# Patient Record
Sex: Male | Born: 1985 | Race: Black or African American | Hispanic: No | Marital: Single | Smoking: Never smoker
Health system: Southern US, Community
[De-identification: ages and names within clinical notes are randomized; demographics above are authoritative.]

## PROBLEM LIST (undated history)

## (undated) DIAGNOSIS — E871 Hypo-osmolality and hyponatremia: Secondary | ICD-10-CM

## (undated) DIAGNOSIS — E1110 Type 2 diabetes mellitus with ketoacidosis without coma: Secondary | ICD-10-CM

## (undated) DIAGNOSIS — I214 Non-ST elevation (NSTEMI) myocardial infarction: Secondary | ICD-10-CM

## (undated) DIAGNOSIS — R809 Proteinuria, unspecified: Secondary | ICD-10-CM

## (undated) DIAGNOSIS — R1909 Other intra-abdominal and pelvic swelling, mass and lump: Secondary | ICD-10-CM

## (undated) DIAGNOSIS — E559 Vitamin D deficiency, unspecified: Secondary | ICD-10-CM

## (undated) DIAGNOSIS — L02214 Cutaneous abscess of groin: Secondary | ICD-10-CM

## (undated) DIAGNOSIS — I10 Essential (primary) hypertension: Secondary | ICD-10-CM

## (undated) DIAGNOSIS — E1065 Type 1 diabetes mellitus with hyperglycemia: Secondary | ICD-10-CM

## (undated) DIAGNOSIS — Z89611 Acquired absence of right leg above knee: Secondary | ICD-10-CM

## (undated) DIAGNOSIS — D509 Iron deficiency anemia, unspecified: Secondary | ICD-10-CM

## (undated) DIAGNOSIS — R17 Unspecified jaundice: Secondary | ICD-10-CM

## (undated) DIAGNOSIS — D649 Anemia, unspecified: Secondary | ICD-10-CM

## (undated) DIAGNOSIS — E860 Dehydration: Secondary | ICD-10-CM

## (undated) DIAGNOSIS — K529 Noninfective gastroenteritis and colitis, unspecified: Secondary | ICD-10-CM

## (undated) DIAGNOSIS — N179 Acute kidney failure, unspecified: Secondary | ICD-10-CM

## (undated) DIAGNOSIS — E119 Type 2 diabetes mellitus without complications: Secondary | ICD-10-CM

## (undated) DIAGNOSIS — E7800 Pure hypercholesterolemia, unspecified: Secondary | ICD-10-CM

## (undated) HISTORY — DX: Pure hypercholesterolemia, unspecified: E78.00

## (undated) HISTORY — DX: Unspecified jaundice: R17

## (undated) HISTORY — DX: Non-ST elevation (NSTEMI) myocardial infarction: I21.4

## (undated) HISTORY — DX: Cutaneous abscess of groin: L02.214

## (undated) HISTORY — DX: Proteinuria, unspecified: R80.9

## (undated) HISTORY — DX: Other disorders of phosphorus metabolism: E83.39

## (undated) HISTORY — DX: Noninfective gastroenteritis and colitis, unspecified: K52.9

## (undated) HISTORY — DX: Other intra-abdominal and pelvic swelling, mass and lump: R19.09

## (undated) HISTORY — DX: Type 1 diabetes mellitus with hyperglycemia: E10.65

## (undated) HISTORY — DX: Type 2 diabetes mellitus with ketoacidosis without coma: E11.10

## (undated) HISTORY — DX: Acquired absence of right leg above knee: Z89.611

## (undated) HISTORY — DX: Anemia, unspecified: D64.9

## (undated) HISTORY — DX: Essential (primary) hypertension: I10

## (undated) HISTORY — DX: Iron deficiency anemia, unspecified: D50.9

## (undated) HISTORY — DX: Vitamin D deficiency, unspecified: E55.9

## (undated) HISTORY — DX: Hypo-osmolality and hyponatremia: E87.1

## (undated) HISTORY — PX: LEG AMPUTATION ABOVE KNEE: SHX117

## (undated) HISTORY — DX: Dehydration: E86.0

## (undated) HISTORY — DX: Acute kidney failure, unspecified: N17.9

---

## 2012-03-03 DIAGNOSIS — E1065 Type 1 diabetes mellitus with hyperglycemia: Secondary | ICD-10-CM | POA: Insufficient documentation

## 2012-03-03 HISTORY — DX: Type 1 diabetes mellitus with hyperglycemia: E10.65

## 2013-02-19 DIAGNOSIS — N179 Acute kidney failure, unspecified: Secondary | ICD-10-CM | POA: Insufficient documentation

## 2013-02-19 DIAGNOSIS — I214 Non-ST elevation (NSTEMI) myocardial infarction: Secondary | ICD-10-CM | POA: Insufficient documentation

## 2013-02-19 HISTORY — DX: Non-ST elevation (NSTEMI) myocardial infarction: I21.4

## 2013-02-19 HISTORY — DX: Acute kidney failure, unspecified: N17.9

## 2013-02-25 DIAGNOSIS — E7800 Pure hypercholesterolemia, unspecified: Secondary | ICD-10-CM

## 2013-02-25 HISTORY — DX: Pure hypercholesterolemia, unspecified: E78.00

## 2014-11-17 DIAGNOSIS — D509 Iron deficiency anemia, unspecified: Secondary | ICD-10-CM | POA: Insufficient documentation

## 2014-11-17 HISTORY — DX: Iron deficiency anemia, unspecified: D50.9

## 2015-02-07 DIAGNOSIS — I10 Essential (primary) hypertension: Secondary | ICD-10-CM

## 2015-02-07 HISTORY — DX: Essential (primary) hypertension: I10

## 2015-02-15 DIAGNOSIS — Z89611 Acquired absence of right leg above knee: Secondary | ICD-10-CM | POA: Insufficient documentation

## 2015-02-15 HISTORY — DX: Acquired absence of right leg above knee: Z89.611

## 2015-12-14 DIAGNOSIS — R809 Proteinuria, unspecified: Secondary | ICD-10-CM

## 2015-12-14 DIAGNOSIS — D649 Anemia, unspecified: Secondary | ICD-10-CM | POA: Insufficient documentation

## 2015-12-14 HISTORY — DX: Proteinuria, unspecified: R80.9

## 2015-12-14 HISTORY — DX: Anemia, unspecified: D64.9

## 2015-12-14 HISTORY — DX: Other disorders of phosphorus metabolism: E83.39

## 2015-12-16 DIAGNOSIS — E559 Vitamin D deficiency, unspecified: Secondary | ICD-10-CM

## 2015-12-16 HISTORY — DX: Vitamin D deficiency, unspecified: E55.9

## 2016-06-01 ENCOUNTER — Encounter: Payer: Self-pay | Admitting: Emergency Medicine

## 2016-06-01 ENCOUNTER — Inpatient Hospital Stay
Admission: EM | Admit: 2016-06-01 | Discharge: 2016-06-02 | DRG: 638 | Disposition: A | Discharge status: Home / Self Care | Payer: Self-pay | Attending: Internal Medicine | Admitting: Internal Medicine

## 2016-06-01 DIAGNOSIS — Z794 Long term (current) use of insulin: Secondary | ICD-10-CM

## 2016-06-01 DIAGNOSIS — E1110 Type 2 diabetes mellitus with ketoacidosis without coma: Secondary | ICD-10-CM

## 2016-06-01 DIAGNOSIS — E1010 Type 1 diabetes mellitus with ketoacidosis without coma: Principal | ICD-10-CM | POA: Diagnosis present

## 2016-06-01 DIAGNOSIS — N179 Acute kidney failure, unspecified: Secondary | ICD-10-CM | POA: Diagnosis present

## 2016-06-01 DIAGNOSIS — R609 Edema, unspecified: Secondary | ICD-10-CM

## 2016-06-01 DIAGNOSIS — E875 Hyperkalemia: Secondary | ICD-10-CM | POA: Diagnosis present

## 2016-06-01 DIAGNOSIS — E860 Dehydration: Secondary | ICD-10-CM | POA: Diagnosis present

## 2016-06-01 DIAGNOSIS — Z9119 Patient's noncompliance with other medical treatment and regimen: Secondary | ICD-10-CM

## 2016-06-01 HISTORY — DX: Type 2 diabetes mellitus without complications: E11.9

## 2016-06-01 HISTORY — DX: Type 2 diabetes mellitus with ketoacidosis without coma: E11.10

## 2016-06-01 LAB — BASIC METABOLIC PANEL
ANION GAP: 22 — AB (ref 5–15)
BUN: 22 mg/dL — ABNORMAL HIGH (ref 6–20)
CALCIUM: 8.6 mg/dL — AB (ref 8.9–10.3)
CO2: 8 mmol/L — AB (ref 22–32)
Chloride: 106 mmol/L (ref 101–111)
Creatinine, Ser: 1.5 mg/dL — ABNORMAL HIGH (ref 0.61–1.24)
GFR calc non Af Amer: >60 mL/min (ref >60)
Glucose, Bld: 436 mg/dL — ABNORMAL HIGH (ref 65–99)
Potassium: 5 mmol/L (ref 3.5–5.1)
Sodium: 136 mmol/L (ref 135–145)

## 2016-06-01 LAB — URINALYSIS COMPLETE WITH MICROSCOPIC (ARMC ONLY)
Bacteria, UA: NONE SEEN
Bilirubin Urine: NEGATIVE
Leukocytes, UA: NEGATIVE
NITRITE: NEGATIVE
Protein, ur: >500 mg/dL — AB
SPECIFIC GRAVITY, URINE: 1.02 (ref 1.005–1.030)
Squamous Epithelial / LPF: NONE SEEN
pH: 5 (ref 5.0–8.0)

## 2016-06-01 LAB — CBC
HCT: 41.2 % (ref 40.0–52.0)
Hemoglobin: 13.1 g/dL (ref 13.0–18.0)
MCH: 26.4 pg (ref 26.0–34.0)
MCHC: 31.9 g/dL — AB (ref 32.0–36.0)
MCV: 82.7 fL (ref 80.0–100.0)
Platelets: 294 10*3/uL (ref 150–440)
RBC: 4.98 MIL/uL (ref 4.40–5.90)
RDW: 14.9 % — AB (ref 11.5–14.5)
WBC: 6.9 10*3/uL (ref 3.8–10.6)

## 2016-06-01 LAB — COMPREHENSIVE METABOLIC PANEL
ALBUMIN: 2.7 g/dL — AB (ref 3.5–5.0)
ALK PHOS: 108 U/L (ref 38–126)
ALT: 29 U/L (ref 17–63)
AST: 25 U/L (ref 15–41)
Anion gap: 24 — ABNORMAL HIGH (ref 5–15)
BILIRUBIN TOTAL: 1.8 mg/dL — AB (ref 0.3–1.2)
BUN: 22 mg/dL — AB (ref 6–20)
CALCIUM: 9.4 mg/dL (ref 8.9–10.3)
CO2: 9 mmol/L — ABNORMAL LOW (ref 22–32)
CREATININE: 1.59 mg/dL — AB (ref 0.61–1.24)
Chloride: 101 mmol/L (ref 101–111)
GFR calc Af Amer: >60 mL/min (ref >60)
GFR calc non Af Amer: 56 mL/min — ABNORMAL LOW (ref >60)
GLUCOSE: 518 mg/dL — AB (ref 65–99)
Potassium: 5.2 mmol/L — ABNORMAL HIGH (ref 3.5–5.1)
Sodium: 134 mmol/L — ABNORMAL LOW (ref 135–145)
TOTAL PROTEIN: 7 g/dL (ref 6.5–8.1)

## 2016-06-01 LAB — GLUCOSE, CAPILLARY
GLUCOSE-CAPILLARY: 344 mg/dL — AB (ref 65–99)
Glucose-Capillary: 168 mg/dL — ABNORMAL HIGH (ref 65–99)
Glucose-Capillary: 193 mg/dL — ABNORMAL HIGH (ref 65–99)
Glucose-Capillary: 337 mg/dL — ABNORMAL HIGH (ref 65–99)
Glucose-Capillary: 369 mg/dL — ABNORMAL HIGH (ref 65–99)
Glucose-Capillary: 408 mg/dL — ABNORMAL HIGH (ref 65–99)
Glucose-Capillary: 436 mg/dL — ABNORMAL HIGH (ref 65–99)

## 2016-06-01 LAB — BLOOD GAS, VENOUS
Acid-base deficit: 19.6 mmol/L — ABNORMAL HIGH (ref 0.0–2.0)
Bicarbonate: 8.1 mmol/L — ABNORMAL LOW (ref 20.0–28.0)
O2 Saturation: 54.5 %
Patient temperature: 37
pCO2, Ven: 25 mmHg — ABNORMAL LOW (ref 44.0–60.0)
pH, Ven: 7.12 — CL (ref 7.250–7.430)
pO2, Ven: 40 mmHg (ref 32.0–45.0)

## 2016-06-01 LAB — LIPASE, BLOOD: Lipase: 18 U/L (ref 11–51)

## 2016-06-01 MED ORDER — SODIUM CHLORIDE 0.9 % IV SOLN
INTRAVENOUS | Status: DC
  Administered 2016-06-01: 150 mL/h via INTRAVENOUS

## 2016-06-01 MED ORDER — KETOROLAC TROMETHAMINE 15 MG/ML IJ SOLN
15.0000 mg | Freq: Three times a day (TID) | INTRAMUSCULAR | Status: DC | PRN
  Filled 2016-06-01: qty 1

## 2016-06-01 MED ORDER — ONDANSETRON HCL 4 MG/2ML IJ SOLN
4.0000 mg | Freq: Four times a day (QID) | INTRAMUSCULAR | Status: DC | PRN
  Administered 2016-06-01: 4 mg via INTRAVENOUS
  Filled 2016-06-01: qty 2

## 2016-06-01 MED ORDER — SODIUM CHLORIDE 0.9 % IV SOLN
INTRAVENOUS | Status: DC
  Administered 2016-06-01: 3.5 [IU]/h via INTRAVENOUS
  Filled 2016-06-01: qty 2.5

## 2016-06-01 MED ORDER — KCL IN DEXTROSE-NACL 20-5-0.45 MEQ/L-%-% IV SOLN
INTRAVENOUS | Status: DC
  Administered 2016-06-01: 125 mL/h via INTRAVENOUS
  Filled 2016-06-01 (×3): qty 1000

## 2016-06-01 MED ORDER — FAMOTIDINE IN NACL 20-0.9 MG/50ML-% IV SOLN
20.0000 mg | Freq: Two times a day (BID) | INTRAVENOUS | Status: DC
  Administered 2016-06-01 – 2016-06-02 (×2): 20 mg via INTRAVENOUS
  Filled 2016-06-01 (×2): qty 50

## 2016-06-01 MED ORDER — ONDANSETRON HCL 4 MG PO TABS: 4.0000 mg | ORAL_TABLET | Freq: Four times a day (QID) | ORAL | Status: DC | PRN

## 2016-06-01 MED ORDER — SODIUM CHLORIDE 0.9 % IV BOLUS (SEPSIS)
1000.0000 mL | Freq: Once | INTRAVENOUS | Status: AC
  Administered 2016-06-01: 1000 mL via INTRAVENOUS

## 2016-06-01 MED ORDER — ENOXAPARIN SODIUM 40 MG/0.4ML ~~LOC~~ SOLN
40.0000 mg | SUBCUTANEOUS | Status: DC
  Administered 2016-06-01: 40 mg via SUBCUTANEOUS
  Filled 2016-06-01: qty 0.4

## 2016-06-01 MED ORDER — ONDANSETRON HCL 4 MG/2ML IJ SOLN
4.0000 mg | Freq: Once | INTRAMUSCULAR | Status: AC
  Administered 2016-06-01: 4 mg via INTRAVENOUS
  Filled 2016-06-01: qty 2

## 2016-06-01 NOTE — ED Triage Notes (Signed)
States he developed body aches with possible fever /chills 2 days ago  Vomiting started last pm and again this am  Last time vomited was PTA

## 2016-06-01 NOTE — ED Provider Notes (Signed)
[facility] Emergency Department Provider Note  Time seen: 4:05 PM  I have reviewed the triage vital signs and the nursing notes.   HISTORY  Chief Complaint Generalized Body Aches and Emesis    HPI [name] is a 30 y.o. male with a past medical history of diabetes who presents the emergency department nausea, vomiting, generalized weakness. According to the patient for the past 3 or 4 days he has had moderate congestion with mild cough. He woke this morning feeling very nauseated and vomiting, unable to keep down food or fluids. States he feels very weak so he came to the emergency department. Denies fever.   Past Medical History:  Diagnosis Date  . Diabetes mellitus without complication (HCC)     There are no active problems to display for this patient.   History reviewed. No pertinent surgical history.  Prior to Admission medications   Medication Sig Start Date End Date Taking? Authorizing Provider  insulin lispro (HUMALOG) 100 UNIT/ML injection Inject 20 Units into the skin 2 (two) times daily at 10 AM and 5 PM.   Yes Historical Provider, MD    No Known Allergies  No family history on file.  Social History Social History  Substance Use Topics  . Smoking status: Never Smoker  . Smokeless tobacco: Never Used  . Alcohol use No    Review of Systems Constitutional: Negative for fever. Cardiovascular: Negative for chest pain. Respiratory: Negative for shortness of breath. Gastrointestinal: Negative for abdominal pain. Positive for nausea or vomiting. Genitourinary: Negative for dysuria. Neurological: Negative for headache 10-point ROS otherwise negative.  ____________________________________________   PHYSICAL EXAM:  VITAL SIGNS: ED Triage Vitals  Enc Vitals Group     BP 06/01/16 1459 136/71     Pulse Rate 06/01/16 1459 (!) 113     Resp 06/01/16 1459 20     Temp 06/01/16 1459 98 F (36.7 C)     Temp Source 06/01/16 1459 Oral      SpO2 06/01/16 1459 100 %     Weight 06/01/16 1457 189 lb (85.7 kg)     Height 06/01/16 1457 5\' 6"  (1.676 m)     Head Circumference --      Peak Flow --      Pain Score --      Pain Loc --      Pain Edu? --      Excl. in GC? --    Constitutional: Alert and oriented. Well appearing and in no distress.Nauseated. Eyes: Normal exam ENT   Head: Normocephalic and atraumatic.   Mouth/Throat: Very dry mucous membranes. Cardiovascular: Normal rate, regular rhythm. No murmur Respiratory: Normal respiratory effort without tachypnea nor retractions. Breath sounds are clear  Gastrointestinal: Soft and nontender. No distention.   Musculoskeletal: Nontender with normal range of motion in all extremities.  Neurologic:  Normal speech and language. No gross focal neurologic deficits Skin:  Skin is warm, dry and intact.  Psychiatric: Mood and affect are normal. Speech and behavior are normal.   ____________________________________________   INITIAL IMPRESSION / ASSESSMENT AND PLAN / ED COURSE  Pertinent labs & imaging results that were available during my care of the patient were reviewed by me and considered in my medical decision making (see chart for details).  Patient presents the emergency department nausea and vomiting. Patient's blood glucose is significantly elevated with an anion gap of 24 patient states a history of DKA in the past similar to today's presentation. We will start IV fluids,  obtain a VBG to assess pH. Patient will likely require insulin drip and admission to the hospital for further treatment.   CRITICAL CARE Performed by: Minna AntisPADUCHOWSKI, Rosamond Andress   Total critical care time: 30 minutes  Critical care time was exclusive of separately billable procedures and treating other patients.  Critical care was necessary to treat or prevent imminent or life-threatening deterioration.  Critical care was time spent personally by me on the following activities: development of  treatment plan with patient and/or surrogate as well as nursing, discussions with consultants, evaluation of patient's response to treatment, examination of patient, obtaining history from patient or surrogate, ordering and performing treatments and interventions, ordering and review of laboratory studies, ordering and review of radiographic studies, pulse oximetry and re-evaluation of patient's condition.   EKG reviewed and interpreted by myself shows sinus tachycardia 115 bpm, narrow QRS, normal axis, largely normal intervals with nonspecific but no concerning ST changes.  PH of 7.12, K of 5.2. Blood glucose elevated in the 500s we'll start the patient on insulin drip per DKA and admit for further treatment.  ____________________________________________   FINAL CLINICAL IMPRESSION(S) / ED DIAGNOSES  DKA    Minna AntisKevin Isaack Preble, MD 06/01/16 1645

## 2016-06-01 NOTE — H&P (Signed)
[facility] - [place] at [facility]   PATIENT NAME: [name]    MR#:  [id number]  DATE OF BIRTH:  07/23/1985  DATE OF ADMISSION:  06/01/2016  PRIMARY CARE PHYSICIAN: No PCP Per Patient   REQUESTING/REFERRING PHYSICIAN: Minna AntisKevin Paduchowski, MD  CHIEF COMPLAINT:  Vomiting and body aches for 2 days  HISTORY OF PRESENT ILLNESS:  [name]  is a 30 y.o. male with a known history of Diabetic mellitus on NovoLog and Humalog could not recall long-acting insulin name is presenting to the ED with a chief complaint of 2 day history of nausea and vomiting and feeling weak and tired with body aches today. Denies any fever or sick contacts. Could not recall his long-acting insulin name, states he ran out of it several days ago  PAST MEDICAL HISTORY:   Past Medical History:  Diagnosis Date  . Diabetes mellitus without complication (HCC)     PAST SURGICAL HISTOIRY:  History reviewed. No pertinent surgical history.  SOCIAL HISTORY:   Social History  Substance Use Topics  . Smoking status: Never Smoker  . Smokeless tobacco: Never Used  . Alcohol use No    FAMILY HISTORY:  Diabetes mellitus runs in his family  DRUG ALLERGIES:  No Known Allergies  REVIEW OF SYSTEMS:  CONSTITUTIONAL: No fever,Reporting fatigue and weakness.  EYES: No blurred or double vision.  EARS, NOSE, AND THROAT: No tinnitus or ear pain.  RESPIRATORY: No cough, shortness of breath, wheezing or hemoptysis.  CARDIOVASCULAR: No chest pain, orthopnea, edema.  GASTROINTESTINAL: Reporting  nausea, vomiting for 2 days, no diarrhea or abdominal pain.  GENITOURINARY: No dysuria, hematuria.  ENDOCRINE: No polyuria, nocturia,  HEMATOLOGY: No anemia, easy bruising or bleeding SKIN: No rash or lesion. MUSCULOSKELETAL: Reporting generalized body pains No joint pain or arthritis.   NEUROLOGIC: No tingling, numbness, weakness.  PSYCHIATRY: No anxiety or depression.   MEDICATIONS AT HOME:    Prior to Admission medications   Medication Sig Start Date End Date Taking? Authorizing Provider  insulin aspart (NOVOLOG) 100 UNIT/ML injection Inject 20 Units into the skin 3 (three) times daily before meals.   Yes Historical Provider, MD  insulin lispro (HUMALOG) 100 UNIT/ML injection Inject 20 Units into the skin once.    Yes Historical Provider, MD      VITAL SIGNS:  Blood pressure (!) 143/65, pulse (!) 114, temperature 98 F (36.7 C), temperature source Oral, resp. rate 17, height 5\' 6"  (1.676 m), weight 85.7 kg (189 lb), SpO2 100 %.  PHYSICAL EXAMINATION:  Dry mucous membranes GENERAL:  30 y.o.-year-old patient lying in the bed with no acute distress.  EYES: Pupils equal, round, reactive to light and accommodation. No scleral icterus. Extraocular muscles intact.  HEENT: Head atraumatic, normocephalic. Oropharynx and nasopharynx clear.  NECK:  Supple, no jugular venous distention. No thyroid enlargement, no tenderness.  LUNGS: Normal breath sounds bilaterally, no wheezing, rales,rhonchi or crepitation. No use of accessory muscles of respiration.  CARDIOVASCULAR: S1, S2 normal. No murmurs, rubs, or gallops.  ABDOMEN: Soft, nontender, nondistended. Bowel sounds present. No organomegaly or mass.  EXTREMITIES: Right below-knee amputation No pedal edema, cyanosis, or clubbing.  NEUROLOGIC: Cranial nerves II through XII are intact. Muscle strength 5/5 in all extremities. Sensation intact. Gait not checked.  PSYCHIATRIC: The patient is alert and oriented x 3.  SKIN: No obvious rash, lesion, or ulcer.   LABORATORY PANEL:   CBC  Recent Labs Lab 06/01/16 1502  WBC 6.9  HGB 13.1  HCT 41.2  PLT 294   ------------------------------------------------------------------------------------------------------------------  Chemistries   Recent Labs Lab 06/01/16 1502  NA 134*  K 5.2*  CL 101  CO2 9*  GLUCOSE 518*  BUN 22*  CREATININE 1.59*  CALCIUM 9.4  AST 25  ALT 29   ALKPHOS 108  BILITOT 1.8*   ------------------------------------------------------------------------------------------------------------------  Cardiac Enzymes No results for input(s): TROPONINI in the last 168 hours. ------------------------------------------------------------------------------------------------------------------  RADIOLOGY:  No results found.  EKG:   Orders placed or performed during the hospital encounter of 06/01/16  . EKG 12-Lead  . EKG 12-Lead    IMPRESSION AND PLAN:   [name]  is a 30 y.o. male with a known history of Diabetic mellitus on NovoLog and Humalog could not recall long-acting insulin name is presenting to the ED with a chief complaint of 2 day history of nausea and vomiting and feeling weak and tired with body aches today  # Diabetic ketoacidosis from dehydration from nausea and vomiting Stepdown unit Aggressive  hydration with  normal salineIV fluids, will change IV fluids to D5 half normal saline with 20 KCl when the blood sugar is less than 250   insulin drip   serial BMPs every 4 hours   consult diabetic coordinator   patient is noncompliant with his long-acting insulin ran out of it several weeks ago and could not recall his name Needs diabetic education, needed to be discharged long-acting insulin at the time of discharge  #Hyperkalemia-must be pseudohyperkalemia in the setting of DKA Patient is on aggressive hydration with IV fluids will repeat BMP in 4 hours  #Acute kidney injury from dehydration from nausea and vomiting Supportive treatment, IV fluids and antiemetics Monitor renal function closely  #Body aches generalized could be from dehydration Pain management as needed  #Noncompliance with medications Reinforced the importance of being compliant with his short-acting as well as long-acting insulin    GI prophylaxis with Pepcid and DVT prophylaxis with Lovenox subcutaneous  l the records are reviewed and case  discussed with ED provider. Management plans discussed with the patient, family and they are in agreement.  CODE STATUS: Full code, brother is the healthcare power of attorney  TOTAL CRITICAL CARE TIME TAKING CARE OF THIS PATIENT: 45 minutes.   Note: This dictation was prepared with Dragon dictation along with smaller phrase technology. Any transcriptional errors that result from this process are unintentional.  Ramonita LabGouru, Ural Acree M.D on 06/01/2016 at 5:39 PM  Between 7am to 6pm - Pager - [phone]  After 6pm go to [email or web address] - password [email or web address] [facility]  [facility]  Office  [phone]  CC: Primary care physician; No PCP Per Patient

## 2016-06-02 ENCOUNTER — Inpatient Hospital Stay: Payer: Self-pay

## 2016-06-02 LAB — COMPREHENSIVE METABOLIC PANEL
ALT: 22 U/L (ref 17–63)
AST: 22 U/L (ref 15–41)
Albumin: 2.2 g/dL — ABNORMAL LOW (ref 3.5–5.0)
Alkaline Phosphatase: 69 U/L (ref 38–126)
Anion gap: 11 (ref 5–15)
BUN: 16 mg/dL (ref 6–20)
CHLORIDE: 109 mmol/L (ref 101–111)
CO2: 18 mmol/L — AB (ref 22–32)
Calcium: 8.1 mg/dL — ABNORMAL LOW (ref 8.9–10.3)
Creatinine, Ser: 1.03 mg/dL (ref 0.61–1.24)
Glucose, Bld: 202 mg/dL — ABNORMAL HIGH (ref 65–99)
POTASSIUM: 4.3 mmol/L (ref 3.5–5.1)
SODIUM: 138 mmol/L (ref 135–145)
Total Bilirubin: 1.1 mg/dL (ref 0.3–1.2)
Total Protein: 6.1 g/dL — ABNORMAL LOW (ref 6.5–8.1)

## 2016-06-02 LAB — CBC
HCT: 35.3 % — ABNORMAL LOW (ref 40.0–52.0)
Hemoglobin: 11.6 g/dL — ABNORMAL LOW (ref 13.0–18.0)
MCH: 25.5 pg — ABNORMAL LOW (ref 26.0–34.0)
MCHC: 33 g/dL (ref 32.0–36.0)
MCV: 77.4 fL — ABNORMAL LOW (ref 80.0–100.0)
PLATELETS: 236 10*3/uL (ref 150–440)
RBC: 4.56 MIL/uL (ref 4.40–5.90)
RDW: 14.4 % (ref 11.5–14.5)
WBC: 8.6 10*3/uL (ref 3.8–10.6)

## 2016-06-02 LAB — BASIC METABOLIC PANEL
Anion gap: 11 (ref 5–15)
Anion gap: 6 (ref 5–15)
BUN: 19 mg/dL (ref 6–20)
BUN: 20 mg/dL (ref 6–20)
CALCIUM: 8.4 mg/dL — AB (ref 8.9–10.3)
CALCIUM: 8.6 mg/dL — AB (ref 8.9–10.3)
CO2: 15 mmol/L — ABNORMAL LOW (ref 22–32)
CO2: 19 mmol/L — ABNORMAL LOW (ref 22–32)
CREATININE: 1.16 mg/dL (ref 0.61–1.24)
CREATININE: 1.32 mg/dL — AB (ref 0.61–1.24)
Chloride: 115 mmol/L — ABNORMAL HIGH (ref 101–111)
Chloride: 116 mmol/L — ABNORMAL HIGH (ref 101–111)
GFR calc Af Amer: >60 mL/min (ref >60)
GFR calc Af Amer: >60 mL/min (ref >60)
GLUCOSE: 187 mg/dL — AB (ref 65–99)
Glucose, Bld: 195 mg/dL — ABNORMAL HIGH (ref 65–99)
POTASSIUM: 4.2 mmol/L (ref 3.5–5.1)
POTASSIUM: 4.2 mmol/L (ref 3.5–5.1)
SODIUM: 141 mmol/L (ref 135–145)
SODIUM: 141 mmol/L (ref 135–145)

## 2016-06-02 LAB — TSH: TSH: 0.171 u[IU]/mL — AB (ref 0.350–4.500)

## 2016-06-02 LAB — MRSA PCR SCREENING: MRSA BY PCR: NEGATIVE

## 2016-06-02 LAB — GLUCOSE, CAPILLARY
GLUCOSE-CAPILLARY: 122 mg/dL — AB (ref 65–99)
GLUCOSE-CAPILLARY: 170 mg/dL — AB (ref 65–99)
GLUCOSE-CAPILLARY: 176 mg/dL — AB (ref 65–99)
GLUCOSE-CAPILLARY: 197 mg/dL — AB (ref 65–99)
Glucose-Capillary: 181 mg/dL — ABNORMAL HIGH (ref 65–99)
Glucose-Capillary: 140 mg/dL — ABNORMAL HIGH (ref 65–99)
Glucose-Capillary: 126 mg/dL — ABNORMAL HIGH (ref 65–99)
Glucose-Capillary: 114 mg/dL — ABNORMAL HIGH (ref 65–99)

## 2016-06-02 MED ORDER — INSULIN ASPART 100 UNIT/ML ~~LOC~~ SOLN: 0.0000 [IU] | Freq: Every day | SUBCUTANEOUS | Status: DC

## 2016-06-02 MED ORDER — LIVING WELL WITH DIABETES BOOK
Freq: Once | Status: AC
Start: 2016-06-02 — End: 2016-06-02
  Administered 2016-06-02: 1
  Filled 2016-06-02: qty 1

## 2016-06-02 MED ORDER — SODIUM CHLORIDE 0.9 % IV SOLN
INTRAVENOUS | Status: DC
Start: 2016-06-02 — End: 2016-06-02
  Administered 2016-06-02: 125 mL/h via INTRAVENOUS

## 2016-06-02 MED ORDER — INSULIN GLARGINE 100 UNIT/ML ~~LOC~~ SOLN
14.0000 [IU] | Freq: Every day | SUBCUTANEOUS | Status: DC
  Administered 2016-06-02: 14 [IU] via SUBCUTANEOUS
  Filled 2016-06-02: qty 0.14

## 2016-06-02 MED ORDER — INSULIN LISPRO 100 UNIT/ML ~~LOC~~ SOLN: 20.0000 [IU] | Freq: Once | SUBCUTANEOUS | 11 refills | Status: DC

## 2016-06-02 MED ORDER — INSULIN ASPART 100 UNIT/ML ~~LOC~~ SOLN
0.0000 [IU] | Freq: Three times a day (TID) | SUBCUTANEOUS | Status: DC
  Administered 2016-06-02: 4 [IU] via SUBCUTANEOUS
  Filled 2016-06-02: qty 4

## 2016-06-02 MED ORDER — INSULIN ASPART 100 UNIT/ML ~~LOC~~ SOLN: 20.0000 [IU] | Freq: Three times a day (TID) | SUBCUTANEOUS | 11 refills | Status: DC

## 2016-06-02 MED ORDER — INSULIN ASPART 100 UNIT/ML ~~LOC~~ SOLN
4.0000 [IU] | Freq: Three times a day (TID) | SUBCUTANEOUS | Status: DC
  Administered 2016-06-02: 4 [IU] via SUBCUTANEOUS
  Filled 2016-06-02: qty 4

## 2016-06-02 NOTE — Progress Notes (Signed)
Pt alert and feeling he is on the rebound. CH offered prayer.   06/02/16 0655  Clinical Encounter Type  Visited With Patient  Visit Type Initial  Referral From Nurse  Spiritual Encounters  Spiritual Needs Prayer  Stress Factors  Patient Stress Factors None identified

## 2016-06-02 NOTE — Discharge Summary (Signed)
[facility] - [place] at [facility]  [name], [place]30 y.o., DOB 08/12/1985, MRN [id number]. Admission date: 06/01/2016 Discharge Date 06/02/2016 Primary MD No PCP Per Patient Admitting Physician Henry LabAruna Gouru, MD  Admission Diagnosis  Diabetic ketoacidosis without coma associated with type 1 diabetes mellitus (HCC) [E10.10]  Discharge Diagnosis   Active Problems:   DKA (diabetic ketoacidoses) [facility](HCC)   Peripheral vascular disease        Hospital Course [name]  is a 30 y.o. male with a known history of Diabetic mellitus on NovoLog and Humalog could not recall long-acting insulin name is presenting to the ED with a chief complaint of 2 day history of nausea and vomiting and feeling weak and tired with body aches. He was brought to the ED and was noted to be in DKA. He was admitted and given aggressive IV fluids. And started on IV insulin his acidosis resolved. Patient was feeling much better this morning. She was complaining of left lower extremity swelling and Doppler of his leg was done that was negative for DVT.            Consults  none  Significant Tests:  See full reports for all details    [place] Venous Img Lower Unilateral Left  Result Date: 06/02/2016 CLINICAL DATA:  Patient with left lower extremity swelling. EXAM: LEFT LOWER EXTREMITY VENOUS DOPPLER ULTRASOUND TECHNIQUE: Gray-scale sonography with graded compression, as well as color Doppler and duplex ultrasound were performed to evaluate the lower extremity deep venous systems from the level of the common femoral vein and including the common femoral, femoral, profunda femoral, popliteal and calf veins including the posterior tibial, peroneal and gastrocnemius veins when visible. The superficial great saphenous vein was also interrogated. Spectral Doppler was utilized to evaluate flow at rest and with distal augmentation maneuvers in the common femoral, femoral and popliteal veins. COMPARISON:  None.  FINDINGS: Contralateral Common Femoral Vein: Respiratory phasicity is normal and symmetric with the symptomatic side. No evidence of thrombus. Normal compressibility. Common Femoral Vein: No evidence of thrombus. Normal compressibility, respiratory phasicity and response to augmentation. Saphenofemoral Junction: No evidence of thrombus. Normal compressibility and flow on color Doppler imaging. Profunda Femoral Vein: No evidence of thrombus. Normal compressibility and flow on color Doppler imaging. Femoral Vein: No evidence of thrombus. Normal compressibility, respiratory phasicity and response to augmentation. Popliteal Vein: No evidence of thrombus. Normal compressibility, respiratory phasicity and response to augmentation. Calf Veins: No evidence of thrombus. Normal compressibility and flow on color Doppler imaging. Superficial Great Saphenous Vein: No evidence of thrombus. Normal compressibility and flow on color Doppler imaging. Venous Reflux:  None. Other Findings: Incidentally identified is an enlarged cortically thickened node within the left inguinal region. Lymph node measures 3.5 x 0.9 x 2.6 cm. IMPRESSION: No evidence of deep venous thrombosis. Incidentally identified is a cortically thickened enlarged left inguinal lymph node, nonspecific. Recommend clinical and laboratory correlation. Electronically Signed   By: Annia Beltrew  Davis M.D.   On: 06/02/2016 11:06       Today   Subjective:   [name]  patient feels much better denies any complaints except swelling of his left lower extremity Objective:   Blood pressure (!) 150/86, pulse 96, temperature 98 F (36.7 C), temperature source Oral, resp. rate 18, height 5\' 6"  (1.676 m), weight 189 lb (85.7 kg), SpO2 100 %.  .  Intake/Output Summary (Last 24 hours) at 06/02/16 1520 Last data filed at 06/02/16 1300  Gross per 24 hour  Intake  1026.58 ml  Output             1200 ml  Net          -173.42 ml    Exam VITAL SIGNS: Blood  pressure (!) 150/86, pulse 96, temperature 98 F (36.7 C), temperature source Oral, resp. rate 18, height 5\' 6"  (1.676 m), weight 189 lb (85.7 kg), SpO2 100 %.  GENERAL:  30 y.o.-year-old patient lying in the bed with no acute distress.  EYES: Pupils equal, round, reactive to light and accommodation. No scleral icterus. Extraocular muscles intact.  HEENT: Head atraumatic, normocephalic. Oropharynx and nasopharynx clear.  NECK:  Supple, no jugular venous distention. No thyroid enlargement, no tenderness.  LUNGS: Normal breath sounds bilaterally, no wheezing, rales,rhonchi or crepitation. No use of accessory muscles of respiration.  CARDIOVASCULAR: S1, S2 normal. No murmurs, rubs, or gallops.  ABDOMEN: Soft, nontender, nondistended. Bowel sounds present. No organomegaly or mass.  EXTREMITIES: Right-sided AKA  NEUROLOGIC: Cranial nerves II through XII are intact. Muscle strength 5/5 in all extremities. Sensation intact. Gait not checked.  PSYCHIATRIC: The patient is alert and oriented x 3.  SKIN: No obvious rash, lesion, or ulcer.   Data Review     CBC w Diff: Gordon Results  Component Value Date   WBC 8.6 06/02/2016   HGB 11.6 (L) 06/02/2016   HCT 35.3 (L) 06/02/2016   PLT 236 06/02/2016   CMP: Gordon Results  Component Value Date   NA 138 06/02/2016   K 4.3 06/02/2016   CL 109 06/02/2016   CO2 18 (L) 06/02/2016   BUN 16 06/02/2016   CREATININE 1.03 06/02/2016   PROT 6.1 (L) 06/02/2016   ALBUMIN 2.2 (L) 06/02/2016   BILITOT 1.1 06/02/2016   ALKPHOS 69 06/02/2016   AST 22 06/02/2016   ALT 22 06/02/2016  .  Micro Results Recent Results (from the past 240 hour(s))  MRSA PCR Screening     Status: None   Collection Time: 06/01/16  8:00 PM  Result Value Ref Range Status   MRSA by PCR NEGATIVE NEGATIVE Final    Comment:        The GeneXpert MRSA Assay (FDA approved for NASAL specimens only), is one component of a comprehensive MRSA colonization surveillance program. It is  not intended to diagnose MRSA infection nor to guide or monitor treatment for MRSA infections.         Code Status Orders        Start     Ordered   06/01/16 1927  Full code  Continuous     06/01/16 1926    Code Status History    Date Active Date Inactive Code Status Order ID Comments User Context   This patient has a current code status but no historical code status.          Follow-up Information    pcp Follow up in 7 day(s).           Discharge Medications     Medication List    TAKE these medications   insulin aspart 100 UNIT/ML injection Commonly known as:  novoLOG Inject 20 Units into the skin 3 (three) times daily before meals.   insulin lispro 100 UNIT/ML injection Commonly known as:  HUMALOG Inject 0.2 mLs (20 Units total) into the skin once.          Total Time in preparing paper work, data evaluation and todays exam - 35 minutes  Auburn BilberryPATEL, Caton Popowski M.D on 06/02/2016 at 3:20  PM  [facility]   Office  [phone]

## 2016-06-02 NOTE — Progress Notes (Signed)
Pt has remained alert and oriented with no c/o pain. NSR on cardiac monitor. RR even and unlabored, SpO2 100% on RA. Lung sounds clear to auscultation. Insulin gtt off at 0733.  Care management saw pt regarding resources and coupons to obtain long-acting insulin.  Pt with c/o LLE swelling-no pain, +3 pulse, no tingling or numbness. Dr Allena KatzPatel, Sh. ordered doppler of LLE to r/o clot. Pt with orders to be discharged; however, awaiting doppler results.

## 2016-06-02 NOTE — Progress Notes (Signed)
Dr. Estanislado Pandy notified of met b results for transition orders.

## 2016-06-02 NOTE — Care Management Note (Signed)
Case Management Note  Patient Details  Name: Hinton DyerDarrick Langlinais MRN: [id number] Date of Birth: 01/12/1986  Subjective/Objective:       Provided uninsured, no PCP Mr Gala Lewandowskyorain with a list of community medical providers who service the uninsured. Provided Mr Gala Lewandowskyorain with a coupon from the [employer] to take to one of the pharmacies listed on the coupon.              Action/Plan:   Expected Discharge Date:                  Expected Discharge Plan:     In-House Referral:     Discharge planning Services     Post Acute Care Choice:    Choice offered to:     DME Arranged:    DME Agency:     HH Arranged:    HH Agency:     Status of Service:     If discussed at [employer] Length of Stay Meetings, dates discussed:    Additional Comments:  Alyxandria Wentz A, RN 06/02/2016, 9:30 AM

## 2016-06-02 NOTE — Discharge Instructions (Signed)
[facility] - [place] at [facility] ° °DIET:  °Diabetic diet ° °DISCHARGE CONDITION:  °Stable ° °ACTIVITY:  °Activity as tolerated ° °OXYGEN:  °Home Oxygen: No. °  °Oxygen Delivery: room air ° °DISCHARGE LOCATION:  °home  ° ° °ADDITIONAL DISCHARGE INSTRUCTION: ° ° °If you experience worsening of your admission symptoms, develop shortness of breath, life threatening emergency, suicidal or homicidal thoughts you must seek medical attention immediately by calling 911 or calling your MD immediately  if symptoms less severe. ° °You Must read complete instructions/literature along with all the possible adverse reactions/side effects for all the Medicines you take and that have been prescribed to you. Take any new Medicines after you have completely understood and accpet all the possible adverse reactions/side effects.  ° °Please note ° °You were cared for by a hospitalist during your hospital stay. If you have any questions about your discharge medications or the care you received while you were in the hospital after you are discharged, you can call the unit and asked to speak with the hospitalist on call if the hospitalist that took care of you is not available. Once you are discharged, your primary care physician will handle any further medical issues. Please note that NO REFILLS for any discharge medications will be authorized once you are discharged, as it is imperative that you return to your primary care physician (or establish a relationship with a primary care physician if you do not have one) for your aftercare needs so that they can reassess your need for medications and monitor your lab values. ° ° °

## 2016-06-03 LAB — HEMOGLOBIN A1C
Hgb A1c MFr Bld: 14.8 % — ABNORMAL HIGH (ref 4.8–5.6)
Mean Plasma Glucose: 378 mg/dL

## 2016-09-09 ENCOUNTER — Emergency Department: Payer: Self-pay

## 2016-09-09 ENCOUNTER — Encounter: Payer: Self-pay | Admitting: Emergency Medicine

## 2016-09-09 ENCOUNTER — Inpatient Hospital Stay
Admission: EM | Admit: 2016-09-09 | Discharge: 2016-09-11 | DRG: 638 | Disposition: A | Discharge status: Home / Self Care | Payer: Self-pay | Attending: Internal Medicine | Admitting: Internal Medicine

## 2016-09-09 DIAGNOSIS — Z794 Long term (current) use of insulin: Secondary | ICD-10-CM

## 2016-09-09 DIAGNOSIS — L02415 Cutaneous abscess of right lower limb: Secondary | ICD-10-CM

## 2016-09-09 DIAGNOSIS — E871 Hypo-osmolality and hyponatremia: Secondary | ICD-10-CM | POA: Diagnosis present

## 2016-09-09 DIAGNOSIS — E1110 Type 2 diabetes mellitus with ketoacidosis without coma: Secondary | ICD-10-CM | POA: Diagnosis present

## 2016-09-09 DIAGNOSIS — L02214 Cutaneous abscess of groin: Secondary | ICD-10-CM

## 2016-09-09 DIAGNOSIS — Z7982 Long term (current) use of aspirin: Secondary | ICD-10-CM

## 2016-09-09 DIAGNOSIS — E860 Dehydration: Secondary | ICD-10-CM

## 2016-09-09 DIAGNOSIS — E1310 Other specified diabetes mellitus with ketoacidosis without coma: Secondary | ICD-10-CM

## 2016-09-09 DIAGNOSIS — Z79899 Other long term (current) drug therapy: Secondary | ICD-10-CM

## 2016-09-09 DIAGNOSIS — K529 Noninfective gastroenteritis and colitis, unspecified: Secondary | ICD-10-CM

## 2016-09-09 DIAGNOSIS — N289 Disorder of kidney and ureter, unspecified: Secondary | ICD-10-CM | POA: Diagnosis present

## 2016-09-09 DIAGNOSIS — R1909 Other intra-abdominal and pelvic swelling, mass and lump: Secondary | ICD-10-CM | POA: Diagnosis present

## 2016-09-09 DIAGNOSIS — E1010 Type 1 diabetes mellitus with ketoacidosis without coma: Principal | ICD-10-CM | POA: Diagnosis present

## 2016-09-09 DIAGNOSIS — R17 Unspecified jaundice: Secondary | ICD-10-CM

## 2016-09-09 DIAGNOSIS — Z833 Family history of diabetes mellitus: Secondary | ICD-10-CM

## 2016-09-09 HISTORY — DX: Cutaneous abscess of groin: L02.214

## 2016-09-09 LAB — CBC
HEMATOCRIT: 38 % — AB (ref 40.0–52.0)
Hemoglobin: 12.4 g/dL — ABNORMAL LOW (ref 13.0–18.0)
MCH: 26 pg (ref 26.0–34.0)
MCHC: 32.6 g/dL (ref 32.0–36.0)
MCV: 80 fL (ref 80.0–100.0)
Platelets: 298 10*3/uL (ref 150–440)
RBC: 4.75 MIL/uL (ref 4.40–5.90)
RDW: 13.4 % (ref 11.5–14.5)
WBC: 7.4 10*3/uL (ref 3.8–10.6)

## 2016-09-09 LAB — GLUCOSE, CAPILLARY
GLUCOSE-CAPILLARY: 152 mg/dL — AB (ref 65–99)
GLUCOSE-CAPILLARY: 158 mg/dL — AB (ref 65–99)
GLUCOSE-CAPILLARY: 183 mg/dL — AB (ref 65–99)
GLUCOSE-CAPILLARY: 228 mg/dL — AB (ref 65–99)
GLUCOSE-CAPILLARY: 292 mg/dL — AB (ref 65–99)
GLUCOSE-CAPILLARY: 360 mg/dL — AB (ref 65–99)
GLUCOSE-CAPILLARY: 375 mg/dL — AB (ref 65–99)
GLUCOSE-CAPILLARY: 388 mg/dL — AB (ref 65–99)
Glucose-Capillary: 164 mg/dL — ABNORMAL HIGH (ref 65–99)

## 2016-09-09 LAB — BLOOD GAS, VENOUS
Acid-base deficit: 15.1 mmol/L — ABNORMAL HIGH (ref 0.0–2.0)
Bicarbonate: 11.1 mmol/L — ABNORMAL LOW (ref 20.0–28.0)
O2 SAT: 64.1 %
PCO2 VEN: 27 mmHg — AB (ref 44.0–60.0)
Patient temperature: 37
pH, Ven: 7.22 — ABNORMAL LOW (ref 7.250–7.430)
pO2, Ven: 41 mmHg (ref 32.0–45.0)

## 2016-09-09 LAB — COMPREHENSIVE METABOLIC PANEL
ALK PHOS: 104 U/L (ref 38–126)
ALT: 16 U/L — ABNORMAL LOW (ref 17–63)
ANION GAP: 20 — AB (ref 5–15)
AST: 16 U/L (ref 15–41)
Albumin: 2 g/dL — ABNORMAL LOW (ref 3.5–5.0)
BILIRUBIN TOTAL: 1.5 mg/dL — AB (ref 0.3–1.2)
BUN: 20 mg/dL (ref 6–20)
CO2: 13 mmol/L — ABNORMAL LOW (ref 22–32)
Calcium: 8.4 mg/dL — ABNORMAL LOW (ref 8.9–10.3)
Chloride: 99 mmol/L — ABNORMAL LOW (ref 101–111)
Creatinine, Ser: 1.44 mg/dL — ABNORMAL HIGH (ref 0.61–1.24)
Glucose, Bld: 426 mg/dL — ABNORMAL HIGH (ref 65–99)
POTASSIUM: 4.1 mmol/L (ref 3.5–5.1)
Sodium: 132 mmol/L — ABNORMAL LOW (ref 135–145)
TOTAL PROTEIN: 6.4 g/dL — AB (ref 6.5–8.1)

## 2016-09-09 LAB — URINALYSIS, COMPLETE (UACMP) WITH MICROSCOPIC
Bacteria, UA: NONE SEEN
Bilirubin Urine: NEGATIVE
Ketones, ur: 80 mg/dL — AB
Leukocytes, UA: NEGATIVE
NITRITE: NEGATIVE
PH: 5 (ref 5.0–8.0)
Protein, ur: >=300 mg/dL — AB
SPECIFIC GRAVITY, URINE: 1.022 (ref 1.005–1.030)

## 2016-09-09 LAB — BASIC METABOLIC PANEL
Anion gap: 13 (ref 5–15)
BUN: 19 mg/dL (ref 6–20)
CHLORIDE: 105 mmol/L (ref 101–111)
CO2: 17 mmol/L — AB (ref 22–32)
Calcium: 8.2 mg/dL — ABNORMAL LOW (ref 8.9–10.3)
Creatinine, Ser: 1.26 mg/dL — ABNORMAL HIGH (ref 0.61–1.24)
GFR calc Af Amer: >60 mL/min (ref >60)
GFR calc non Af Amer: >60 mL/min (ref >60)
GLUCOSE: 236 mg/dL — AB (ref 65–99)
POTASSIUM: 3.7 mmol/L (ref 3.5–5.1)
Sodium: 135 mmol/L (ref 135–145)

## 2016-09-09 LAB — BETA-HYDROXYBUTYRIC ACID

## 2016-09-09 LAB — MRSA PCR SCREENING: MRSA BY PCR: NEGATIVE

## 2016-09-09 LAB — LIPASE, BLOOD: Lipase: 13 U/L (ref 11–51)

## 2016-09-09 MED ORDER — POTASSIUM CHLORIDE CRYS ER 20 MEQ PO TBCR
20.0000 meq | EXTENDED_RELEASE_TABLET | Freq: Two times a day (BID) | ORAL | Status: DC
  Administered 2016-09-09 – 2016-09-10 (×3): 20 meq via ORAL
  Filled 2016-09-09 (×3): qty 1

## 2016-09-09 MED ORDER — VANCOMYCIN HCL IN DEXTROSE 1-5 GM/200ML-% IV SOLN
1000.0000 mg | Freq: Once | INTRAVENOUS | Status: AC
  Administered 2016-09-09: 1000 mg via INTRAVENOUS
  Filled 2016-09-09: qty 200

## 2016-09-09 MED ORDER — PIPERACILLIN-TAZOBACTAM 3.375 G IVPB 30 MIN
3.3750 g | Freq: Once | INTRAVENOUS | Status: AC
  Administered 2016-09-09: 3.375 g via INTRAVENOUS
  Filled 2016-09-09 (×2): qty 50

## 2016-09-09 MED ORDER — ATORVASTATIN CALCIUM 20 MG PO TABS
40.0000 mg | ORAL_TABLET | Freq: Every day | ORAL | Status: DC
  Administered 2016-09-09 – 2016-09-11 (×3): 40 mg via ORAL
  Filled 2016-09-09 (×3): qty 2

## 2016-09-09 MED ORDER — INSULIN REGULAR HUMAN 100 UNIT/ML IJ SOLN
INTRAMUSCULAR | Status: DC
  Administered 2016-09-09: 3 [IU]/h via INTRAVENOUS
  Filled 2016-09-09: qty 2.5

## 2016-09-09 MED ORDER — DEXTROSE-NACL 5-0.45 % IV SOLN
INTRAVENOUS | Status: DC
  Administered 2016-09-09: 21:00:00 via INTRAVENOUS

## 2016-09-09 MED ORDER — SODIUM CHLORIDE 0.9 % IV SOLN
Freq: Once | INTRAVENOUS | Status: AC
  Administered 2016-09-09: 14:00:00 via INTRAVENOUS

## 2016-09-09 MED ORDER — SODIUM CHLORIDE 0.9 % IV SOLN: INTRAVENOUS | Status: DC

## 2016-09-09 MED ORDER — SODIUM CHLORIDE 0.9 % IV SOLN
3.0000 g | Freq: Four times a day (QID) | INTRAVENOUS | Status: DC
  Administered 2016-09-10 – 2016-09-11 (×6): 3 g via INTRAVENOUS
  Filled 2016-09-09 (×11): qty 3

## 2016-09-09 MED ORDER — SODIUM CHLORIDE 0.9 % IV BOLUS (SEPSIS)
1000.0000 mL | INTRAVENOUS | Status: AC
  Administered 2016-09-09: 1000 mL via INTRAVENOUS

## 2016-09-09 MED ORDER — SODIUM CHLORIDE 0.9 % IV SOLN
INTRAVENOUS | Status: AC
  Administered 2016-09-09: 20:00:00 via INTRAVENOUS

## 2016-09-09 MED ORDER — SODIUM CHLORIDE 0.9 % IV SOLN
INTRAVENOUS | Status: DC
  Administered 2016-09-09: 3.7 [IU]/h via INTRAVENOUS
  Administered 2016-09-09: 3.4 [IU]/h via INTRAVENOUS

## 2016-09-09 MED ORDER — HEPARIN SODIUM (PORCINE) 5000 UNIT/ML IJ SOLN
5000.0000 [IU] | Freq: Three times a day (TID) | INTRAMUSCULAR | Status: DC
  Administered 2016-09-09 – 2016-09-11 (×5): 5000 [IU] via SUBCUTANEOUS
  Filled 2016-09-09 (×6): qty 1

## 2016-09-09 MED ORDER — ASPIRIN EC 81 MG PO TBEC
81.0000 mg | DELAYED_RELEASE_TABLET | Freq: Every day | ORAL | Status: DC
  Administered 2016-09-09 – 2016-09-11 (×3): 81 mg via ORAL
  Filled 2016-09-09 (×3): qty 1

## 2016-09-09 NOTE — Progress Notes (Signed)
Pt. Requested a prayer from Chaplain. Pt. Told Chaplain that he was concerned with his health situation and he needed a prayer so that he would get well. Chaplain prayed with patient and shared with him words of encouragement.

## 2016-09-09 NOTE — ED Notes (Signed)
Attempted to call report

## 2016-09-09 NOTE — Progress Notes (Signed)
MEDICATION RELATED CONSULT NOTE - INITIAL   Pharmacy Consult for Electrolyte management in DKA  Indication: DKA   No Known Allergies  Patient Measurements: Height: 5\' 6"  (167.6 cm) Weight: 186 lb (84.4 kg) IBW/kg (Calculated) : 63.8 Adjusted Body Weight:   Vital Signs: Temp: 98.3 F (36.8 C) (03/05 1401) Temp Source: Oral (03/05 1401) BP: 109/66 (03/05 1401) Pulse Rate: 114 (03/05 1401) Intake/Output from previous day: No intake/output data recorded. Intake/Output from this shift: No intake/output data recorded.  Labs:  Recent Labs  09/09/16 1413  WBC 7.4  HGB 12.4*  HCT 38.0*  PLT 298  CREATININE 1.44*  ALBUMIN 2.0*  PROT 6.4*  AST 16  ALT 16*  ALKPHOS 104  BILITOT 1.5*   Estimated Creatinine Clearance: 74.3 mL/min (by C-G formula based on SCr of 1.44 mg/dL (H)).   Microbiology: No results found for this or any previous visit (from the past 720 hour(s)).  Medical History: Past Medical History:  Diagnosis Date  . Diabetes mellitus without complication (HCC)     Medications:   (Not in a hospital admission) Scheduled:    Assessment: Pharmacy consulted to assist in the management in this 31 year old DKA patient that's currently on Insulin gtt.     Goal of Therapy:  K= 4.0  Plan:  K is within goal range. Will check electrolytes with am labs.    Marylene Masek D 09/09/2016,5:12 PM

## 2016-09-09 NOTE — Progress Notes (Signed)
ANTIBIOTIC CONSULT NOTE - INITIAL  Pharmacy Consult for Unasyn Indication: rt groin abscess  No Known Allergies  Patient Measurements: Height: 5\' 6"  (167.6 cm) Weight: 186 lb (84.4 kg) IBW/kg (Calculated) : 63.8 Adjusted Body Weight:   Vital Signs: Temp: 98.3 F (36.8 C) (03/05 1401) Temp Source: Oral (03/05 1401) BP: 109/66 (03/05 1401) Pulse Rate: 114 (03/05 1401) Intake/Output from previous day: No intake/output data recorded. Intake/Output from this shift: No intake/output data recorded.  Labs:  Recent Labs  09/09/16 1413  WBC 7.4  HGB 12.4*  PLT 298  CREATININE 1.44*   Estimated Creatinine Clearance: 74.3 mL/min (by C-G formula based on SCr of 1.44 mg/dL (H)). No results for input(s): VANCOTROUGH, VANCOPEAK, VANCORANDOM, GENTTROUGH, GENTPEAK, GENTRANDOM, TOBRATROUGH, TOBRAPEAK, TOBRARND, AMIKACINPEAK, AMIKACINTROU, AMIKACIN in the last 72 hours.   Microbiology: No results found for this or any previous visit (from the past 720 hour(s)).  Medical History: Past Medical History:  Diagnosis Date  . Diabetes mellitus without complication (HCC)     Medications:   (Not in a hospital admission) Scheduled:   Assessment: Pharmacy consulted to dose and monitor unasyn therapy in this 31 year old male being treated for groin abscess. Patient received ONE dose of Vancomycin and Zosyn in ER .  Goal of Therapy:  Resolution of condition  Plan:  Will start Unasyn 3 g IV q6 hours @ 00:00 since patient received Zosyn dose in ER.    Jakaleb Payer D 09/09/2016,6:38 PM

## 2016-09-09 NOTE — H&P (Addendum)
[employer] [facility] - [facility]egino [facility] at [facility]   PATIENT NAME: [name]    MR#:  [id number]  DATE OF BIRTH:  09/12/1985  DATE OF ADMISSION:  09/09/2016  PRIMARY CARE PHYSICIAN: No PCP Per Patient   REQUESTING/REFERRING PHYSICIAN: York CeriseForbach  CHIEF COMPLAINT:   Chief Complaint  Patient presents with  . Headache  . Dizziness  . Abdominal Pain    HISTORY OF PRESENT ILLNESS: [name]  is a 31 y.o. male with a known history of Diabetes- Takes his insulin regularly on time. 2 years ago he had the infection and finally ended up having amputation on his right lower extremity and since then he is wearing a prosthesis which is covering up to his groin and pinching him on his right groin. For last few days to weeks he started noticing some pain over there and noticed some swelling on his right groin.   today morning when he woke up he felt uneasy, nauseated so he did not took his insulin and came to emergency room where he was noted to be in DKA. Ultrasound on right groin also showed some localized collection possibly abscess.  PAST MEDICAL HISTORY:   Past Medical History:  Diagnosis Date  . Diabetes mellitus without complication (HCC)     PAST SURGICAL HISTORY: Past Surgical History:  Procedure Laterality Date  . LEG AMPUTATION ABOVE KNEE Right     SOCIAL HISTORY:  Social History  Substance Use Topics  . Smoking status: Never Smoker  . Smokeless tobacco: Never Used  . Alcohol use No    FAMILY HISTORY:  Family History  Problem Relation Age of Onset  . Diabetes Mother     DRUG ALLERGIES: No Known Allergies  REVIEW OF SYSTEMS:   CONSTITUTIONAL: No fever, fatigue or weakness.  EYES: No blurred or double vision.  EARS, NOSE, AND THROAT: No tinnitus or ear pain.  RESPIRATORY: No cough, shortness of breath, wheezing or hemoptysis.  CARDIOVASCULAR: No chest pain, orthopnea, edema.  GASTROINTESTINAL: No nausea, vomiting, diarrhea or abdominal pain.  GENITOURINARY:  No dysuria, hematuria.  ENDOCRINE: No polyuria, nocturia,  HEMATOLOGY: No anemia, easy bruising or bleeding SKIN: No rash or lesion. MUSCULOSKELETAL: No joint pain or arthritis.   NEUROLOGIC: No tingling, numbness, weakness.  PSYCHIATRY: No anxiety or depression.   MEDICATIONS AT HOME:  Prior to Admission medications   Medication Sig Start Date End Date Taking? Authorizing Provider  aspirin EC 81 MG tablet Take 81 mg by mouth daily. 12/20/15  Yes Historical Provider, MD  atorvastatin (LIPITOR) 40 MG tablet Take 40 mg by mouth daily. 12/17/15 12/16/16 Yes Historical Provider, MD  insulin aspart (NOVOLOG) 100 UNIT/ML injection Inject 20 Units into the skin 3 (three) times daily before meals. 06/02/16  Yes Auburn BilberryShreyang Patel, MD  insulin glargine (LANTUS) 100 UNIT/ML injection Inject 10 Units into the skin at bedtime.    Yes Historical Provider, MD  insulin NPH-regular Human (NOVOLIN 70/30) (70-30) 100 UNIT/ML injection Inject 16-32 Units into the skin 2 (two) times daily. Take 32 units before breakfast and 16 units before dinner. 12/17/15 12/16/16 Yes Historical Provider, MD  insulin lispro (HUMALOG) 100 UNIT/ML injection Inject 0.2 mLs (20 Units total) into the skin once. 06/02/16 06/02/16  Auburn BilberryShreyang Patel, MD      PHYSICAL EXAMINATION:   VITAL SIGNS: Blood pressure 109/66, pulse (!) 114, temperature 98.3 F (36.8 C), temperature source Oral, resp. rate 16, height 5\' 6"  (1.676 m), weight 84.4 kg (186 lb), SpO2 97 %.  GENERAL:  31  y.o.-year-old patient lying in the bed with no acute distress.  EYES: Pupils equal, round, reactive to light and accommodation. No scleral icterus. Extraocular muscles intact.  HEENT: Head atraumatic, normocephalic. Oropharynx and nasopharynx clear.  NECK:  Supple, no jugular venous distention. No thyroid enlargement, no tenderness.  LUNGS: Normal breath sounds bilaterally, no wheezing, rales,rhonchi or crepitation. No use of accessory muscles of respiration.   CARDIOVASCULAR: S1, S2 normal. No murmurs, rubs, or gallops.  ABDOMEN: Soft, nontender, nondistended. Bowel sounds present. No organomegaly or mass.  EXTREMITIES: No pedal edema, cyanosis, or clubbing. 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  NEUROLOGIC: Cranial nerves II through XII are intact. Muscle strength 5/5 in all extremities. Sensation intact. Gait not checked.  PSYCHIATRIC: The patient is alert and oriented x 3.  SKIN: No obvious rash, lesion, or ulcer.   LABORATORY PANEL:   CBC  Recent Labs Lab 09/09/16 1413  WBC 7.4  HGB 12.4*  HCT 38.0*  PLT 298  MCV 80.0  MCH 26.0  MCHC 32.6  RDW 13.4   ------------------------------------------------------------------------------------------------------------------  Chemistries   Recent Labs Lab 09/09/16 1413  NA 132*  K 4.1  CL 99*  CO2 13*  GLUCOSE 426*  BUN 20  CREATININE 1.44*  CALCIUM 8.4*  AST 16  ALT 16*  ALKPHOS 104  BILITOT 1.5*   ------------------------------------------------------------------------------------------------------------------ estimated creatinine clearance is 74.3 mL/min (by C-G formula based on SCr of 1.44 mg/dL (H)). ------------------------------------------------------------------------------------------------------------------ No results for input(s): TSH, T4TOTAL, T3FREE, THYROIDAB in the last 72 hours.  Invalid input(s): FREET3   Coagulation profile No results for input(s): INR, PROTIME in the last 168 hours. ------------------------------------------------------------------------------------------------------------------- No results for input(s): DDIMER in the last 72 hours. -------------------------------------------------------------------------------------------------------------------  Cardiac Enzymes No results for input(s): CKMB, TROPONINI, MYOGLOBIN in the last 168 hours.  Invalid input(s):  CK ------------------------------------------------------------------------------------------------------------------ Invalid input(s): POCBNP  ---------------------------------------------------------------------------------------------------------------  Urinalysis    Component Value Date/Time   COLORURINE STRAW (A) 09/09/2016 1411   APPEARANCEUR CLEAR (A) 09/09/2016 1411   LABSPEC 1.022 09/09/2016 1411   PHURINE 5.0 09/09/2016 1411   GLUCOSEU >=500 (A) 09/09/2016 1411   HGBUR MODERATE (A) 09/09/2016 1411   BILIRUBINUR NEGATIVE 09/09/2016 1411   KETONESUR 80 (A) 09/09/2016 1411   PROTEINUR >=300 (A) 09/09/2016 1411   NITRITE NEGATIVE 09/09/2016 1411   LEUKOCYTESUR NEGATIVE 09/09/2016 1411     RADIOLOGY: [place] Rt Lower Extrem Ltd Soft Tissue Non Vascular  Result Date: 09/09/2016 CLINICAL DATA:  Right inguinal mass x2 weeks, prosthetic leg EXAM: ULTRASOUND RIGHT LOWER EXTREMITY LIMITED TECHNIQUE: Ultrasound examination of the lower extremity soft tissues was performed in the area of clinical concern. COMPARISON:  None. FINDINGS: 1.5 x 2.5 cm focal area of phlegmonous change in the right inguinal region. While this does not currently reflect a thick-walled fluid collection, an early/developing abscess is possible. Surrounding extensive subcutaneous edema. Two right inguinal nodes measuring up to 9 mm short axis in the right inguinal region, likely reactive. Otherwise, no sonographic abnormality is seen. IMPRESSION: 1.5 x 2.5 cm focal area of phlegmonous change in the right inguinal region, early/developing subcutaneous abscess not excluded. Surrounding subcutaneous edema. Two right inguinal nodes measuring up to 9 mm short axis, likely reactive. Electronically Signed   By: Charline Bills M.D.   On: 09/09/2016 17:06    EKG: Orders placed or performed during the hospital encounter of 06/01/16  . EKG 12-Lead  . EKG 12-Lead    IMPRESSION AND PLAN:  * DKA   Keep on  insulin drip in  stepdown unit.   IV fluids, frequent fingerstick checking and electrolyte management as per DKA ICU protocol.    * Right groin abscess   Unasyn for now, surgical consult for further management.  * Pseudohyponatremia   Continue monitoring with IV fluids.  * Acute renal insufficiency   Monitor with IV fluids.   All the records are reviewed and case discussed with ED provider. Management plans discussed with the patient, family and they are in agreement.  CODE STATUS: full code  Code Status History    Date Active Date Inactive Code Status Order ID Comments User Context   06/01/2016  7:26 PM 06/02/2016  6:35 PM Full Code [id number]  Ramonita Lab, MD Inpatient       TOTAL TIME TAKING CARE OF THIS PATIENT: 50 minutes.    Altamese Dilling M.D on 09/09/2016   Between 7am to 6pm - Pager - [phone]  After 6pm go to [email or web address] - Social research officer, government  [facility]  Office  [phone]  CC: Primary care physician; No PCP Per Patient   Note: This dictation was prepared with Dragon dictation along with smaller phrase technology. Any transcriptional errors that result from this process are unintentional.

## 2016-09-09 NOTE — ED Triage Notes (Signed)
Pt to ED c/o headache, dizziness and abd pain.  Abd pain last night with n/v x2, woke up with headache and dizziness today.  Denies fevers, denies LOC, patient A&Ox4, speaking in complete and coherent sentences, chest rise even and unlabored.

## 2016-09-09 NOTE — ED Provider Notes (Signed)
[facility] Emergency Department Provider Note  ____________________________________________   First MD Initiated Contact with Patient 09/09/16 1553     (approximate)  I have reviewed the triage vital signs and the nursing notes.   HISTORY  Chief Complaint Headache; Dizziness; and Abdominal Pain    HPI [name] is a 31 y.o. male with a history of insulin-dependent diabetes who presents for evaluation of gradually worsening symptoms over the last 2 days that consist of mild headache, lightheadedness, dizziness, nausea, vomiting, decreased appetite.  He has had some abdominal pain last night but none today.  He has not had any trouble breathing and also denies chest pain, fever, chills.  He reports that he has been compliant with his insulin regimen.  The only other thing that is bothering him is that he has a large, firm, swollen area at the top of his right inner thigh/right groin that he thinks is the result of wearing his right lower extremity prosthesis.  It has been rubbing and is painful when he wears a prosthesis but he does not describe the lesion as painful in general.   Past Medical History:  Diagnosis Date  . Diabetes mellitus without complication [facility])     Patient Active Problem List   Diagnosis Date Noted  . DKA (diabetic ketoacidoses) (HCC) 06/01/2016    Past Surgical History:  Procedure Laterality Date  . LEG AMPUTATION ABOVE KNEE Right     Prior to Admission medications   Medication Sig Start Date End Date Taking? Authorizing Provider  aspirin EC 81 MG tablet Take 81 mg by mouth daily. 12/20/15  Yes Historical Provider, MD  atorvastatin (LIPITOR) 40 MG tablet Take 40 mg by mouth daily. 12/17/15 12/16/16 Yes Historical Provider, MD  insulin aspart (NOVOLOG) 100 UNIT/ML injection Inject 20 Units into the skin 3 (three) times daily before meals. 06/02/16  Yes Auburn Bilberry, MD  insulin glargine (LANTUS) 100 UNIT/ML injection Inject  into the skin at bedtime.   Yes Historical Provider, MD  insulin NPH-regular Human (NOVOLIN 70/30) (70-30) 100 UNIT/ML injection Inject 16-32 Units into the skin 2 (two) times daily. Take 32 units before breakfast and 16 units before dinner. 12/17/15 12/16/16 Yes Historical Provider, MD  insulin lispro (HUMALOG) 100 UNIT/ML injection Inject 0.2 mLs (20 Units total) into the skin once. 06/02/16 06/02/16  Auburn Bilberry, MD    Allergies Patient has no known allergies.  History reviewed. No pertinent family history.  Social History Social History  Substance Use Topics  . Smoking status: Never Smoker  . Smokeless tobacco: Never Used  . Alcohol use No    Review of Systems Constitutional: No fever/chills.  Gen. malaise, lightheadedness, generalized weakness Eyes: No visual changes. ENT: No sore throat. Cardiovascular: Denies chest pain. Respiratory: Denies shortness of breath. Gastrointestinal: Nausea, vomiting, and mild abdominal pain for 1-2 days.   Genitourinary: Negative for dysuria. Musculoskeletal: Negative for back pain. Skin: Negative for rash. Neurological: Negative for headaches, focal weakness or numbness.  10-point ROS otherwise negative.  ____________________________________________   PHYSICAL EXAM:  VITAL SIGNS: ED Triage Vitals  Enc Vitals Group     BP 09/09/16 1401 109/66     Pulse Rate 09/09/16 1401 (!) 114     Resp 09/09/16 1401 16     Temp 09/09/16 1401 98.3 F (36.8 C)     Temp Source 09/09/16 1401 Oral     SpO2 09/09/16 1401 97 %     Weight 09/09/16 1402 186 lb (84.4 kg)  Height 09/09/16 1402 5\' 6"  (1.676 m)     Head Circumference --      Peak Flow --      Pain Score 09/09/16 1408 5     Pain Loc --      Pain Edu? --      Excl. in GC? --     Constitutional: Alert and oriented. Well appearing and in no acute distress. Eyes: Conjunctivae are normal. PERRL. EOMI. Head: Atraumatic. Nose: No congestion/rhinnorhea. Mouth/Throat: Mucous membranes  are dry. Neck: No stridor.  No meningeal signs.   Cardiovascular: Tachycardia, regular rhythm. Good peripheral circulation. Grossly normal heart sounds. Respiratory: Normal respiratory effort.  No retractions. Lungs CTAB. Gastrointestinal: Soft and nontender. No distention.  Musculoskeletal: Status post right AKA.  He has a large, indurated, darkened and slightly tender lesion in his right groin that is most consistent with abscess, less likely hernia. Neurologic:  Normal speech and language. No gross focal neurologic deficits are appreciated.  Skin:  Skin is warm, dry and intact. No rash noted. Psychiatric: Mood and affect are normal. Speech and behavior are normal.  ____________________________________________   LABS (all labs ordered are listed, but only abnormal results are displayed)  Labs Reviewed  COMPREHENSIVE METABOLIC PANEL - Abnormal; Notable for the following:       Result Value   Sodium 132 (*)    Chloride 99 (*)    CO2 13 (*)    Glucose, Bld 426 (*)    Creatinine, Ser 1.44 (*)    Calcium 8.4 (*)    Total Protein 6.4 (*)    Albumin 2.0 (*)    ALT 16 (*)    Total Bilirubin 1.5 (*)    Anion gap 20 (*)    All other components within normal limits  CBC - Abnormal; Notable for the following:    Hemoglobin 12.4 (*)    HCT 38.0 (*)    All other components within normal limits  URINALYSIS, COMPLETE (UACMP) WITH MICROSCOPIC - Abnormal; Notable for the following:    Color, Urine STRAW (*)    APPearance CLEAR (*)    Glucose, UA >=500 (*)    Hgb urine dipstick MODERATE (*)    Ketones, ur 80 (*)    Protein, ur >=300 (*)    Squamous Epithelial / LPF 0-5 (*)    All other components within normal limits  GLUCOSE, CAPILLARY - Abnormal; Notable for the following:    Glucose-Capillary 388 (*)    All other components within normal limits  BLOOD GAS, VENOUS - Abnormal; Notable for the following:    pH, Ven 7.22 (*)    pCO2, Ven 27 (*)    Bicarbonate 11.1 (*)    Acid-base  deficit 15.1 (*)    All other components within normal limits  GLUCOSE, CAPILLARY - Abnormal; Notable for the following:    Glucose-Capillary 360 (*)    All other components within normal limits  LIPASE, BLOOD  BETA-HYDROXYBUTYRIC ACID  PHOSPHORUS  MAGNESIUM   ____________________________________________  EKG  None - EKG not ordered by ED physician ____________________________________________  RADIOLOGY   [place] Rt Lower Extrem Ltd Soft Tissue Non Vascular  Result Date: 09/09/2016 CLINICAL DATA:  Right inguinal mass x2 weeks, prosthetic leg EXAM: ULTRASOUND RIGHT LOWER EXTREMITY LIMITED TECHNIQUE: Ultrasound examination of the lower extremity soft tissues was performed in the area of clinical concern. COMPARISON:  None. FINDINGS: 1.5 x 2.5 cm focal area of phlegmonous change in the right inguinal region. While this does not currently reflect  a thick-walled fluid collection, an early/developing abscess is possible. Surrounding extensive subcutaneous edema. Two right inguinal nodes measuring up to 9 mm short axis in the right inguinal region, likely reactive. Otherwise, no sonographic abnormality is seen. IMPRESSION: 1.5 x 2.5 cm focal area of phlegmonous change in the right inguinal region, early/developing subcutaneous abscess not excluded. Surrounding subcutaneous edema. Two right inguinal nodes measuring up to 9 mm short axis, likely reactive. Electronically Signed   By: Charline Bills M.D.   On: 09/09/2016 17:06    ____________________________________________   PROCEDURES  Procedure(s) performed:   .Critical Care Performed by: Loleta Rose Authorized by: Loleta Rose   Critical care provider statement:    Critical care time (minutes):  45   Critical care time was exclusive of:  Separately billable procedures and treating other patients   Critical care was necessary to treat or prevent imminent or life-threatening deterioration of the following conditions:  Metabolic crisis  and endocrine crisis   Critical care was time spent personally by me on the following activities:  Development of treatment plan with patient or surrogate, discussions with consultants, evaluation of patient's response to treatment, examination of patient, obtaining history from patient or surrogate, ordering and performing treatments and interventions, ordering and review of laboratory studies, ordering and review of radiographic studies, pulse oximetry, re-evaluation of patient's condition and review of old charts      Critical Care performed: Yes, see critical care procedure note(s) ____________________________________________   INITIAL IMPRESSION / ASSESSMENT AND PLAN / ED COURSE  Pertinent labs & imaging results that were available during my care of the patient were reviewed by me and considered in my medical decision making (see chart for details).     Clinical Course as of Sep 09 1713  Mon Sep 09, 2016  1602 The patient presents in DKA with a blood glucose of approximately 400, normal GFR but slightly elevated creatinine, and no leukocytosis.  I have added on beta hydroxybutyric acid and a venous blood gas.  He has no abdominal tenderness and is breathing normally but does have mild tachycardia.  He is getting 1 L of fluid now and I will order a second as well as the ED protocol for insulin.  I am ordering a soft tissue ultrasound of the large right inguinal mass - I suspect it is an abscess and may need a surgical consult.  [CF]  1700 ordering zosyn and vancomycin for probable complex abscess in right upper thigh.  No leukocytosis which is reassuring.  Discussed with Dr. Excell Seltzer by phone who recommended hospitalist admission and surgical consult PRN.  I relayed to the hospitalist to please keep the patient NPO in case surgery is required tomorrow.  [CF]    Clinical Course User Index [CF] Loleta Rose, MD    ____________________________________________  FINAL CLINICAL  IMPRESSION(S) / ED DIAGNOSES  Final diagnoses:  Mass of right inguinal region  Diabetic ketoacidosis without coma associated with other specified diabetes mellitus (HCC)  Abscess of right thigh     MEDICATIONS GIVEN DURING THIS VISIT:  Medications  insulin regular (NOVOLIN R,HUMULIN R) 250 Units in sodium chloride 0.9 % 250 mL (1 Units/mL) infusion (3 Units/hr Intravenous New Bag/Given 09/09/16 1704)  piperacillin-tazobactam (ZOSYN) IVPB 3.375 g (not administered)  vancomycin (VANCOCIN) IVPB 1000 mg/200 mL premix (not administered)  sodium chloride 0.9 % bolus 1,000 mL (not administered)  0.9 %  sodium chloride infusion ( Intravenous New Bag/Given 09/09/16 1428)     NEW OUTPATIENT MEDICATIONS STARTED DURING  THIS VISIT:  New Prescriptions   No medications on file    Modified Medications   No medications on file    Discontinued Medications   No medications on file     Note:  This document was prepared using Dragon voice recognition software and may include unintentional dictation errors.    Loleta Rose, MD 09/09/16 [phone]

## 2016-09-10 DIAGNOSIS — R1909 Other intra-abdominal and pelvic swelling, mass and lump: Secondary | ICD-10-CM

## 2016-09-10 DIAGNOSIS — E1310 Other specified diabetes mellitus with ketoacidosis without coma: Secondary | ICD-10-CM

## 2016-09-10 LAB — GLUCOSE, CAPILLARY
GLUCOSE-CAPILLARY: 140 mg/dL — AB (ref 65–99)
GLUCOSE-CAPILLARY: 119 mg/dL — AB (ref 65–99)
GLUCOSE-CAPILLARY: 119 mg/dL — AB (ref 65–99)
GLUCOSE-CAPILLARY: 298 mg/dL — AB (ref 65–99)
Glucose-Capillary: 113 mg/dL — ABNORMAL HIGH (ref 65–99)
Glucose-Capillary: 108 mg/dL — ABNORMAL HIGH (ref 65–99)
Glucose-Capillary: 125 mg/dL — ABNORMAL HIGH (ref 65–99)
Glucose-Capillary: 110 mg/dL — ABNORMAL HIGH (ref 65–99)
Glucose-Capillary: 111 mg/dL — ABNORMAL HIGH (ref 65–99)
Glucose-Capillary: 200 mg/dL — ABNORMAL HIGH (ref 65–99)
Glucose-Capillary: 218 mg/dL — ABNORMAL HIGH (ref 65–99)

## 2016-09-10 LAB — BASIC METABOLIC PANEL
Anion gap: 6 (ref 5–15)
Anion gap: 8 (ref 5–15)
BUN: 13 mg/dL (ref 6–20)
BUN: 14 mg/dL (ref 6–20)
CALCIUM: 7.9 mg/dL — AB (ref 8.9–10.3)
CHLORIDE: 108 mmol/L (ref 101–111)
CO2: 21 mmol/L — ABNORMAL LOW (ref 22–32)
CO2: 22 mmol/L (ref 22–32)
Calcium: 7.7 mg/dL — ABNORMAL LOW (ref 8.9–10.3)
Chloride: 108 mmol/L (ref 101–111)
Creatinine, Ser: 0.88 mg/dL (ref 0.61–1.24)
Creatinine, Ser: 0.71 mg/dL (ref 0.61–1.24)
GFR calc Af Amer: >60 mL/min (ref >60)
GFR calc Af Amer: >60 mL/min (ref >60)
GFR calc non Af Amer: >60 mL/min (ref >60)
GLUCOSE: 144 mg/dL — AB (ref 65–99)
Glucose, Bld: 114 mg/dL — ABNORMAL HIGH (ref 65–99)
POTASSIUM: 3.5 mmol/L (ref 3.5–5.1)
Potassium: 4.1 mmol/L (ref 3.5–5.1)
SODIUM: 137 mmol/L (ref 135–145)
Sodium: 136 mmol/L (ref 135–145)

## 2016-09-10 LAB — PHOSPHORUS: PHOSPHORUS: 1.8 mg/dL — AB (ref 2.5–4.6)

## 2016-09-10 LAB — MAGNESIUM: Magnesium: 1.7 mg/dL (ref 1.7–2.4)

## 2016-09-10 MED ORDER — SODIUM CHLORIDE 0.9 % IV SOLN
INTRAVENOUS | Status: DC
  Administered 2016-09-10: 03:00:00 via INTRAVENOUS

## 2016-09-10 MED ORDER — SODIUM CHLORIDE 0.9 % IV SOLN
INTRAVENOUS | Status: DC
  Administered 2016-09-10 – 2016-09-11 (×2): via INTRAVENOUS

## 2016-09-10 MED ORDER — INSULIN ASPART 100 UNIT/ML ~~LOC~~ SOLN
0.0000 [IU] | Freq: Three times a day (TID) | SUBCUTANEOUS | Status: DC
  Administered 2016-09-10: 5 [IU] via SUBCUTANEOUS
  Administered 2016-09-10 – 2016-09-11 (×3): 3 [IU] via SUBCUTANEOUS
  Filled 2016-09-10: qty 3
  Filled 2016-09-10: qty 5
  Filled 2016-09-10 (×2): qty 3

## 2016-09-10 MED ORDER — INSULIN ASPART 100 UNIT/ML ~~LOC~~ SOLN
4.0000 [IU] | Freq: Three times a day (TID) | SUBCUTANEOUS | Status: DC
  Administered 2016-09-10 – 2016-09-11 (×5): 4 [IU] via SUBCUTANEOUS
  Filled 2016-09-10 (×5): qty 4

## 2016-09-10 MED ORDER — INSULIN ASPART 100 UNIT/ML ~~LOC~~ SOLN
0.0000 [IU] | Freq: Every day | SUBCUTANEOUS | Status: DC
  Administered 2016-09-10: 3 [IU] via SUBCUTANEOUS
  Filled 2016-09-10: qty 3

## 2016-09-10 MED ORDER — INSULIN GLARGINE 100 UNIT/ML ~~LOC~~ SOLN
10.0000 [IU] | Freq: Two times a day (BID) | SUBCUTANEOUS | Status: DC
  Administered 2016-09-10 – 2016-09-11 (×4): 10 [IU] via SUBCUTANEOUS
  Filled 2016-09-10 (×7): qty 0.1

## 2016-09-10 MED ORDER — ACETAMINOPHEN 325 MG PO TABS
650.0000 mg | ORAL_TABLET | ORAL | Status: DC | PRN
  Administered 2016-09-10 – 2016-09-11 (×3): 650 mg via ORAL
  Filled 2016-09-10 (×3): qty 2

## 2016-09-10 NOTE — Progress Notes (Signed)
Inpatient Diabetes Program Recommendations  AACE/ADA: New Consensus Statement on Inpatient Glycemic Control (2015)  Target Ranges:  Prepandial:   less than 140 mg/dL      Peak postprandial:   less than 180 mg/dL (1-2 hours)      Critically ill patients:  140 - 180 mg/dL   Lab Results  Component Value Date   GLUCAP 111 (H) 09/10/2016   HGBA1C 14.8 (H) 06/02/2016    Review of Glycemic Control:  Results for [name], [name] (MRN [id number]) as of 09/10/2016 08:12  Ref. Range 09/09/2016 14:16 09/09/2016 17:00 09/09/2016 18:06 09/09/2016 19:11 09/09/2016 19:49 09/09/2016 21:02 09/09/2016 21:58 09/09/2016 22:58 09/09/2016 23:55 09/10/2016 01:08 09/10/2016 02:02 09/10/2016 03:09 09/10/2016 04:08 09/10/2016 05:00 09/10/2016 06:01 09/10/2016 06:55 09/10/2016 08:03  Glucose-Capillary Latest Ref Range: 65 - 99 mg/dL 409388 (H) 811360 (H) 914375 (H) 292 (H) 228 (H) 183 (H) 164 (H) 158 (H) 152 (H) 140 (H) 119 (H) 119 (H) 113 (H) 108 (H) 125 (H) 110 (H) 111 (H)   Diabetes history: Type 1 diabetes Outpatient Diabetes medications: Novolog 12 units tid with meals, Lantus 10 units q HS Current orders for Inpatient glycemic control:  Lantus 10 units bid, Novolog 4 units tid with meals, Novolog moderate tid with meals   Inpatient Diabetes Program Recommendations:    Spoke with patient regarding home diabetes regimen.  He states that he did not miss insulin doses prior to admit for DKA.  He reports that he saw PCP 2 weeks ago but was not aware of A1C results. Per chart review he has been seen by [facility] in the past with the last documented visit in 08/2015.  He has been switching between Novolog/Lantus regimen and 70/30 regimen due to cost.  When asked how he buys his insulin he states he pays out of pocket and has family members help him.  He currently has "some" insulin left but will likely need new Rx at discharge.  Note that case management has given him information [facility]  Patient is  concerned about prosthesis and that it may be causing "bump" in groin.  Patient was seen by Surgery this morning.   He states that blood sugars when checked are between 70-250 mg/dL.  However he notes that his meter is not turning on.  Advised him to purchase new meter at [facility]- generic brand such as [employer].  Patient is appreciative of information.  Will continue to follow.    Thanks, Beryl MeagerJenny Spyros Winch, RN, BC-ADM Inpatient Diabetes Coordinator Pager [phone] (8a-5p)

## 2016-09-10 NOTE — Progress Notes (Signed)
Spoke with Dr. Winona LegatoVaickute to clarify telemetry and IV fluid order.  Will d/c telemetry and hold off on hanging IV fluids until Dr. Winona LegatoVaickute exams patient.

## 2016-09-10 NOTE — Care Management Note (Signed)
Case Management Note  Patient Details  Name: [name] MRN: [id number] Date of Birth: 1985/11/20  Subjective/Objective:                  Met with patient after he transferred from ICU to this bed. Patient does not have health insurance. He states that his PCP is with [facility] and they help him with medications. I have confirmed this information with Princella Ion. He is not familiar with Medication management.  Action/Plan: Follow up arranged with Altamese Cabal 09/24/16 at 11AM at [facility].  Application to Medication management shared with patient as they are in partnership with Princella Ion for mediation assistance. RNCM to continue to follow; RNCM on unit updated of this assessment.   Expected Discharge Date:                  Expected Discharge Plan:     In-House Referral:  PCP / [facility]  Discharge planning Services  CM Consult, Medication Assistance  Post Acute Care Choice:    Choice offered to:  Patient  DME Arranged:    DME Agency:     HH Arranged:    [place] Agency:     Status of Service:  In process, will continue to follow  If discussed at Long Length of Stay Meetings, dates discussed:    Additional Comments:  Marshell Garfinkel, RN 09/10/2016, 10:34 AM

## 2016-09-10 NOTE — Consult Note (Signed)
Surgical Consultation  09/10/2016  [name] is an 31 y.o. male.   CC: Right groin mass  HPI: This patient with several day history of a right groin mass. 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 He came in with a diagnosis of DKA in the ICU being treated for that he has stabilized considerably. He denies fevers or chills and denies much pain in his groin. He's never had an episode like this before  Past Medical History:  Diagnosis Date  . Diabetes mellitus without complication [facility])     Past Surgical History:  Procedure Laterality Date  . LEG AMPUTATION ABOVE KNEE Right     Family History  Problem Relation Age of Onset  . Diabetes Mother     Social History:  reports that he has never smoked. He has never used smokeless tobacco. He reports that he does not drink alcohol or use drugs.  Allergies: No Known Allergies  Medications reviewed.   Review of Systems:   Review of Systems  Constitutional: Negative for chills and fever.  HENT: Negative.   Respiratory: Negative.   Cardiovascular: Negative.   Gastrointestinal: Negative.   Genitourinary: Negative.   Skin: Negative.      Physical Exam:  BP (!) 152/91   Pulse 96   Temp 98.4 F (36.9 C) (Oral)   Resp 11   Ht 5' 6"  (1.676 m)   Wt 162 lb 14.7 oz (73.9 kg)   SpO2 99%   BMI 26.30 kg/m   Physical Exam  Constitutional: He is well-developed, well-nourished, and in no distress. No distress.  HENT:  Head: Normocephalic and atraumatic.  Neck: Normal range of motion.  Abdominal: Soft. He exhibits no distension. There is no tenderness.  Musculoskeletal: He exhibits no edema.  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  Skin: Skin is warm. He is not diaphoretic. There is erythema.  Vitals reviewed.     Results for orders placed or performed during  the hospital encounter of 09/09/16 (from the past 48 hour(s))  Urinalysis, Complete w Microscopic     Status: Abnormal   Collection Time: 09/09/16  2:11 PM  Result Value Ref Range   Color, Urine STRAW (A) YELLOW   APPearance CLEAR (A) CLEAR   Specific Gravity, Urine 1.022 1.005 - 1.030   pH 5.0 5.0 - 8.0   Glucose, UA >=500 (A) NEGATIVE mg/dL   Hgb urine dipstick MODERATE (A) NEGATIVE   Bilirubin Urine NEGATIVE NEGATIVE   Ketones, ur 80 (A) NEGATIVE mg/dL   Protein, ur >=300 (A) NEGATIVE mg/dL   Nitrite NEGATIVE NEGATIVE   Leukocytes, UA NEGATIVE NEGATIVE   RBC / HPF 0-5 0 - 5 RBC/hpf   WBC, UA 0-5 0 - 5 WBC/hpf   Bacteria, UA NONE SEEN NONE SEEN   Squamous Epithelial / LPF 0-5 (A) NONE SEEN   Mucous PRESENT    Hyaline Casts, UA PRESENT   Lipase, blood     Status: None   Collection Time: 09/09/16  2:13 PM  Result Value Ref Range   Lipase 13 11 - 51 U/L  Comprehensive metabolic panel     Status: Abnormal   Collection Time: 09/09/16  2:13 PM  Result Value Ref Range   Sodium 132 (L) 135 - 145 mmol/L   Potassium 4.1 3.5 - 5.1 mmol/L   Chloride 99 (L) 101 - 111  mmol/L   CO2 13 (L) 22 - 32 mmol/L   Glucose, Bld 426 (H) 65 - 99 mg/dL   BUN 20 6 - 20 mg/dL   Creatinine, Ser 1.44 (H) 0.61 - 1.24 mg/dL   Calcium 8.4 (L) 8.9 - 10.3 mg/dL   Total Protein 6.4 (L) 6.5 - 8.1 g/dL   Albumin 2.0 (L) 3.5 - 5.0 g/dL   AST 16 15 - 41 U/L   ALT 16 (L) 17 - 63 U/L   Alkaline Phosphatase 104 38 - 126 U/L   Total Bilirubin 1.5 (H) 0.3 - 1.2 mg/dL   GFR calc non Af Amer >60 >60 mL/min   GFR calc Af Amer >60 >60 mL/min    Comment: (NOTE) The eGFR has been calculated using the CKD EPI equation. This calculation has not been validated in all clinical situations. eGFR's persistently <60 mL/min signify possible Chronic Kidney Disease.    Anion gap 20 (H) 5 - 15  CBC     Status: Abnormal   Collection Time: 09/09/16  2:13 PM  Result Value Ref Range   WBC 7.4 3.8 - 10.6 K/uL   RBC 4.75 4.40 -  5.90 MIL/uL   Hemoglobin 12.4 (L) 13.0 - 18.0 g/dL   HCT 38.0 (L) 40.0 - 52.0 %   MCV 80.0 80.0 - 100.0 fL   MCH 26.0 26.0 - 34.0 pg   MCHC 32.6 32.0 - 36.0 g/dL   RDW 13.4 11.5 - 14.5 %   Platelets 298 150 - 440 K/uL  Glucose, capillary     Status: Abnormal   Collection Time: 09/09/16  2:16 PM  Result Value Ref Range   Glucose-Capillary 388 (H) 65 - 99 mg/dL  Beta-hydroxybutyric acid     Status: Abnormal   Collection Time: 09/09/16  4:00 PM  Result Value Ref Range   Beta-Hydroxybutyric Acid >8.00 (H) 0.05 - 0.27 mmol/L  Blood gas, venous ([facility], [facility], [facility])     Status: Abnormal   Collection Time: 09/09/16  4:01 PM  Result Value Ref Range   pH, Ven 7.22 (L) 7.250 - 7.430   pCO2, Ven 27 (L) 44.0 - 60.0 mmHg   pO2, Ven 41.0 32.0 - 45.0 mmHg   Bicarbonate 11.1 (L) 20.0 - 28.0 mmol/L   Acid-base deficit 15.1 (H) 0.0 - 2.0 mmol/L   O2 Saturation 64.1 %   Patient temperature 37.0    Collection site VEIN    Sample type VENOUS   Glucose, capillary     Status: Abnormal   Collection Time: 09/09/16  5:00 PM  Result Value Ref Range   Glucose-Capillary 360 (H) 65 - 99 mg/dL  Glucose, capillary     Status: Abnormal   Collection Time: 09/09/16  6:06 PM  Result Value Ref Range   Glucose-Capillary 375 (H) 65 - 99 mg/dL  Glucose, capillary     Status: Abnormal   Collection Time: 09/09/16  7:11 PM  Result Value Ref Range   Glucose-Capillary 292 (H) 65 - 99 mg/dL  Glucose, capillary     Status: Abnormal   Collection Time: 09/09/16  7:49 PM  Result Value Ref Range   Glucose-Capillary 228 (H) 65 - 99 mg/dL  MRSA PCR Screening     Status: None   Collection Time: 09/09/16  7:50 PM  Result Value Ref Range   MRSA by PCR NEGATIVE NEGATIVE    Comment:        The GeneXpert MRSA Assay (FDA approved for NASAL specimens only), is one component of  a comprehensive MRSA colonization surveillance program. It is not intended to diagnose MRSA infection nor to guide or monitor treatment for MRSA  infections.   Basic metabolic panel     Status: Abnormal   Collection Time: 09/09/16  8:05 PM  Result Value Ref Range   Sodium 135 135 - 145 mmol/L   Potassium 3.7 3.5 - 5.1 mmol/L   Chloride 105 101 - 111 mmol/L   CO2 17 (L) 22 - 32 mmol/L   Glucose, Bld 236 (H) 65 - 99 mg/dL   BUN 19 6 - 20 mg/dL   Creatinine, Ser 1.26 (H) 0.61 - 1.24 mg/dL   Calcium 8.2 (L) 8.9 - 10.3 mg/dL   GFR calc non Af Amer >60 >60 mL/min   GFR calc Af Amer >60 >60 mL/min    Comment: (NOTE) The eGFR has been calculated using the CKD EPI equation. This calculation has not been validated in all clinical situations. eGFR's persistently <60 mL/min signify possible Chronic Kidney Disease.    Anion gap 13 5 - 15  Glucose, capillary     Status: Abnormal   Collection Time: 09/09/16  9:02 PM  Result Value Ref Range   Glucose-Capillary 183 (H) 65 - 99 mg/dL  Glucose, capillary     Status: Abnormal   Collection Time: 09/09/16  9:58 PM  Result Value Ref Range   Glucose-Capillary 164 (H) 65 - 99 mg/dL  Glucose, capillary     Status: Abnormal   Collection Time: 09/09/16 10:58 PM  Result Value Ref Range   Glucose-Capillary 158 (H) 65 - 99 mg/dL  Glucose, capillary     Status: Abnormal   Collection Time: 09/09/16 11:55 PM  Result Value Ref Range   Glucose-Capillary 152 (H) 65 - 99 mg/dL  Phosphorus     Status: Abnormal   Collection Time: 09/10/16 12:58 AM  Result Value Ref Range   Phosphorus 1.8 (L) 2.5 - 4.6 mg/dL  Magnesium     Status: None   Collection Time: 09/10/16 12:58 AM  Result Value Ref Range   Magnesium 1.7 1.7 - 2.4 mg/dL  Basic metabolic panel     Status: Abnormal   Collection Time: 09/10/16 12:58 AM  Result Value Ref Range   Sodium 136 135 - 145 mmol/L   Potassium 3.5 3.5 - 5.1 mmol/L   Chloride 108 101 - 111 mmol/L   CO2 22 22 - 32 mmol/L   Glucose, Bld 144 (H) 65 - 99 mg/dL   BUN 14 6 - 20 mg/dL   Creatinine, Ser 0.88 0.61 - 1.24 mg/dL   Calcium 7.7 (L) 8.9 - 10.3 mg/dL   GFR calc non  Af Amer >60 >60 mL/min   GFR calc Af Amer >60 >60 mL/min    Comment: (NOTE) The eGFR has been calculated using the CKD EPI equation. This calculation has not been validated in all clinical situations. eGFR's persistently <60 mL/min signify possible Chronic Kidney Disease.    Anion gap 6 5 - 15  Glucose, capillary     Status: Abnormal   Collection Time: 09/10/16  1:08 AM  Result Value Ref Range   Glucose-Capillary 140 (H) 65 - 99 mg/dL  Glucose, capillary     Status: Abnormal   Collection Time: 09/10/16  2:02 AM  Result Value Ref Range   Glucose-Capillary 119 (H) 65 - 99 mg/dL  Glucose, capillary     Status: Abnormal   Collection Time: 09/10/16  3:09 AM  Result Value Ref Range   Glucose-Capillary 119 (  H) 65 - 99 mg/dL  Glucose, capillary     Status: Abnormal   Collection Time: 09/10/16  4:08 AM  Result Value Ref Range   Glucose-Capillary 113 (H) 65 - 99 mg/dL  Glucose, capillary     Status: Abnormal   Collection Time: 09/10/16  5:00 AM  Result Value Ref Range   Glucose-Capillary 108 (H) 65 - 99 mg/dL  Basic metabolic panel     Status: Abnormal   Collection Time: 09/10/16  5:11 AM  Result Value Ref Range   Sodium 137 135 - 145 mmol/L   Potassium 4.1 3.5 - 5.1 mmol/L    Comment: HEMOLYSIS AT THIS LEVEL MAY AFFECT RESULT   Chloride 108 101 - 111 mmol/L   CO2 21 (L) 22 - 32 mmol/L   Glucose, Bld 114 (H) 65 - 99 mg/dL   BUN 13 6 - 20 mg/dL   Creatinine, Ser 0.71 0.61 - 1.24 mg/dL   Calcium 7.9 (L) 8.9 - 10.3 mg/dL   GFR calc non Af Amer >60 >60 mL/min   GFR calc Af Amer >60 >60 mL/min    Comment: (NOTE) The eGFR has been calculated using the CKD EPI equation. This calculation has not been validated in all clinical situations. eGFR's persistently <60 mL/min signify possible Chronic Kidney Disease.    Anion gap 8 5 - 15  Glucose, capillary     Status: Abnormal   Collection Time: 09/10/16  6:01 AM  Result Value Ref Range   Glucose-Capillary 125 (H) 65 - 99 mg/dL  Glucose,  capillary     Status: Abnormal   Collection Time: 09/10/16  6:55 AM  Result Value Ref Range   Glucose-Capillary 110 (H) 65 - 99 mg/dL  Glucose, capillary     Status: Abnormal   Collection Time: 09/10/16  8:03 AM  Result Value Ref Range   Glucose-Capillary 111 (H) 65 - 99 mg/dL   [place] Soft Tissue Non Vascular  Result Date: 09/09/2016 CLINICAL DATA:  Right inguinal mass x2 weeks, prosthetic leg EXAM: ULTRASOUND RIGHT LOWER EXTREMITY LIMITED TECHNIQUE: Ultrasound examination of the lower extremity soft tissues was performed in the area of clinical concern. COMPARISON:  None. FINDINGS: 1.5 x 2.5 cm focal area of phlegmonous change in the right inguinal region. While this does not currently reflect a thick-walled fluid collection, an early/developing abscess is possible. Surrounding extensive subcutaneous edema. Two right inguinal nodes measuring up to 9 mm short axis in the right inguinal region, likely reactive. Otherwise, no sonographic abnormality is seen. IMPRESSION: 1.5 x 2.5 cm focal area of phlegmonous change in the right inguinal region, early/developing subcutaneous abscess not excluded. Surrounding subcutaneous edema. Two right inguinal nodes measuring up to 9 mm short axis, likely reactive. Electronically Signed   By: Julian Hy M.D.   On: 09/09/2016 17:06    Assessment/Plan:  This patient admitted with a diagnosis of DKA who is been treated in the ICU for that and he has improved considerably.  Yesterday I was called by the emergency room where a right groin mass had been identified and believed to be a area with that was rubbing on his prosthesis. Currently he shows no sign of this being an abscess had an ultrasound done last night shows only phlegmon S material with no drainable abscess. I will continue to observe this patient while he is on IV antibiotics and if this requires drainage at some point I would be done in the operating room. This is discussed with  the  patient and nursing was present.  Florene Glen, MD, FACS

## 2016-09-10 NOTE — Progress Notes (Signed)
MEDICATION RELATED CONSULT NOTE   Pharmacy Consult for Electrolyte management in DKA  Indication: DKA   No Known Allergies  Patient Measurements: Height: 5\' 6"  (167.6 cm) Weight: 162 lb 14.7 oz (73.9 kg) IBW/kg (Calculated) : 63.8 Adjusted Body Weight:   Vital Signs: Temp: 98.2 F (36.8 C) (03/06 0927) Temp Source: Oral (03/06 0927) BP: 140/89 (03/06 0927) Pulse Rate: 101 (03/06 0927) Intake/Output from previous day: 03/05 0701 - 03/06 0700 In: 0  Out: 950 [Urine:950] Intake/Output from this shift: Total I/O In: 1362.1 [P.O.:360; I.V.:802.1; IV Piggyback:200] Out: -   Labs:  Recent Labs  09/09/16 1413 09/09/16 2005 09/10/16 0058 09/10/16 0511  WBC 7.4  --   --   --   HGB 12.4*  --   --   --   HCT 38.0*  --   --   --   PLT 298  --   --   --   CREATININE 1.44* 1.26* 0.88 0.71  MG  --   --  1.7  --   PHOS  --   --  1.8*  --   ALBUMIN 2.0*  --   --   --   PROT 6.4*  --   --   --   AST 16  --   --   --   ALT 16*  --   --   --   ALKPHOS 104  --   --   --   BILITOT 1.5*  --   --   --    Estimated Creatinine Clearance: 118.5 mL/min (by C-G formula based on SCr of 0.71 mg/dL).   Microbiology: Recent Results (from the past 720 hour(s))  MRSA PCR Screening     Status: None   Collection Time: 09/09/16  7:50 PM  Result Value Ref Range Status   MRSA by PCR NEGATIVE NEGATIVE Final    Comment:        The GeneXpert MRSA Assay (FDA approved for NASAL specimens only), is one component of a comprehensive MRSA colonization surveillance program. It is not intended to diagnose MRSA infection nor to guide or monitor treatment for MRSA infections.     Medical History: Past Medical History:  Diagnosis Date  . Diabetes mellitus without complication (HCC)     Medications:  Prescriptions Prior to Admission  Medication Sig Dispense Refill Last Dose  . aspirin EC 81 MG tablet Take 81 mg by mouth daily.     Marland Kitchen. atorvastatin (LIPITOR) 40 MG tablet Take 40 mg by mouth  daily.     . insulin aspart (NOVOLOG) 100 UNIT/ML injection Inject 20 Units into the skin 3 (three) times daily before meals. 10 mL 11 09/08/2016 at Unknown time  . insulin glargine (LANTUS) 100 UNIT/ML injection Inject 10 Units into the skin at bedtime.    09/08/2016 at Unknown time  . insulin NPH-regular Human (NOVOLIN 70/30) (70-30) 100 UNIT/ML injection Inject 16-32 Units into the skin 2 (two) times daily. Take 32 units before breakfast and 16 units before dinner.     . insulin lispro (HUMALOG) 100 UNIT/ML injection Inject 0.2 mLs (20 Units total) into the skin once. 10 mL 11    Scheduled:  . ampicillin-sulbactam (UNASYN) IV  3 g Intravenous Q6H  . aspirin EC  81 mg Oral Daily  . atorvastatin  40 mg Oral Daily  . heparin  5,000 Units Subcutaneous Q8H  . insulin aspart  0-15 Units Subcutaneous TID WC  . insulin aspart  0-5 Units Subcutaneous QHS  .  insulin aspart  4 Units Subcutaneous TID WC  . insulin glargine  10 Units Subcutaneous BID  . potassium chloride  20 mEq Oral BID    Assessment: Pharmacy consulted to assist in the management in this 31 year old DKA patient that's currently on Insulin gtt.     Goal of Therapy:  K= 4.0  Plan:  K is within goal range. Will check electrolytes with am labs.    Clovia Cuff, PharmD, BCPS 09/10/2016 4:12 PM

## 2016-09-10 NOTE — Progress Notes (Signed)
[facility] -  at [facility]   PATIENT NAME: [name]    MR#:  [id number]  DATE OF BIRTH:  [birth year]  SUBJECTIVE:  CHIEF COMPLAINT:   Chief Complaint  Patient presents with  . Headache  . Dizziness  . Abdominal Pain   Patient is 31 year old Latin American male with past medical history significant history of diabetes mellitus, who presents to the hospital with headache, dizziness, abdominal pain. Lateral to the hospital. He was noted to be in DKA and was admitted. He was noted to have right groin swelling, concerning for abscess. Ultrasound revealed phlegmonous changes. Patient was seen by surgeon, who recommended antibiotic therapy, surgical therapy if medical therapy fails, the patient feels better today. Review of Systems  Constitutional: Negative for chills, fever and weight loss.  HENT: Negative for congestion.   Eyes: Negative for blurred vision and double vision.  Respiratory: Negative for cough, sputum production, shortness of breath and wheezing.   Cardiovascular: Positive for leg swelling. Negative for chest pain, palpitations, orthopnea and PND.  Gastrointestinal: Negative for abdominal pain, blood in stool, constipation, diarrhea, nausea and vomiting.  Genitourinary: Negative for dysuria, frequency, hematuria and urgency.  Musculoskeletal: Negative for falls.  Neurological: Negative for dizziness, tremors, focal weakness and headaches.  Endo/Heme/Allergies: Does not bruise/bleed easily.  Psychiatric/Behavioral: Negative for depression. The patient does not have insomnia.     VITAL SIGNS: Blood pressure 140/89, pulse (!) 101, temperature 98.2 F (36.8 C), temperature source Oral, resp. rate 16, height 5\' 6"  (1.676 m), weight 73.9 kg (162 lb 14.7 oz), SpO2 100 %.  PHYSICAL EXAMINATION:   GENERAL:  31 y.o.-year-old patient lying in the bed with no acute distress.  EYES: Pupils equal, round, reactive to light and accommodation. No  scleral icterus. Extraocular muscles intact.  HEENT: Head atraumatic, normocephalic. Oropharynx and nasopharynx clear.  NECK:  Supple, no jugular venous distention. No thyroid enlargement, no tenderness.  LUNGS: Normal breath sounds bilaterally, no wheezing, rales,rhonchi or crepitation. No use of accessory muscles of respiration.  CARDIOVASCULAR: S1, S2 normal. No murmurs, rubs, or gallops.  ABDOMEN: Soft, nontender, nondistended. Bowel sounds present. No organomegaly or mass.  EXTREMITIES: No pedal edema, cyanosis, or clubbing. Right groin nodular swelling was noted of about 10 over 4 cm, the simplification signs, erosion along the skin crease, no purulence was noted NEUROLOGIC: Cranial nerves II through XII are intact. Muscle strength 5/5 in all extremities. Sensation intact. Gait not checked.  PSYCHIATRIC: The patient is alert and oriented x 3.  SKIN: No obvious rash, lesion, or ulcer.   ORDERS/RESULTS REVIEWED:   CBC  Recent Labs Lab 09/09/16 1413  WBC 7.4  HGB 12.4*  HCT 38.0*  PLT 298  MCV 80.0  MCH 26.0  MCHC 32.6  RDW 13.4   ------------------------------------------------------------------------------------------------------------------  Chemistries   Recent Labs Lab 09/09/16 1413 09/09/16 2005 09/10/16 0058 09/10/16 0511  NA 132* 135 136 137  K 4.1 3.7 3.5 4.1  CL 99* 105 108 108  CO2 13* 17* 22 21*  GLUCOSE 426* 236* 144* 114*  BUN 20 19 14 13   CREATININE 1.44* 1.26* 0.88 0.71  CALCIUM 8.4* 8.2* 7.7* 7.9*  MG  --   --  1.7  --   AST 16  --   --   --   ALT 16*  --   --   --   ALKPHOS 104  --   --   --   BILITOT 1.5*  --   --   --    ------------------------------------------------------------------------------------------------------------------  estimated creatinine clearance is 118.5 mL/min (by C-G formula based on SCr of 0.71 mg/dL). ------------------------------------------------------------------------------------------------------------------ No  results for input(s): TSH, T4TOTAL, T3FREE, THYROIDAB in the last 72 hours.  Invalid input(s): FREET3  Cardiac Enzymes No results for input(s): CKMB, TROPONINI, MYOGLOBIN in the last 168 hours.  Invalid input(s): CK ------------------------------------------------------------------------------------------------------------------ Invalid input(s): POCBNP ---------------------------------------------------------------------------------------------------------------  RADIOLOGY: [place] Rt Lower Extrem Ltd Soft Tissue Non Vascular  Result Date: 09/09/2016 CLINICAL DATA:  Right inguinal mass x2 weeks, prosthetic leg EXAM: ULTRASOUND RIGHT LOWER EXTREMITY LIMITED TECHNIQUE: Ultrasound examination of the lower extremity soft tissues was performed in the area of clinical concern. COMPARISON:  None. FINDINGS: 1.5 x 2.5 cm focal area of phlegmonous change in the right inguinal region. While this does not currently reflect a thick-walled fluid collection, an early/developing abscess is possible. Surrounding extensive subcutaneous edema. Two right inguinal nodes measuring up to 9 mm short axis in the right inguinal region, likely reactive. Otherwise, no sonographic abnormality is seen. IMPRESSION: 1.5 x 2.5 cm focal area of phlegmonous change in the right inguinal region, early/developing subcutaneous abscess not excluded. Surrounding subcutaneous edema. Two right inguinal nodes measuring up to 9 mm short axis, likely reactive. Electronically Signed   By: Charline BillsSriyesh  Krishnan M.D.   On: 09/09/2016 17:06    EKG:  Orders placed or performed during the hospital encounter of 06/01/16  . EKG 12-Lead  . 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 Right groin phlegmon, continue Unasyn, MRSA PCR was negative, blood cultures were not taken, patient is afebrile since yesterday, follow clinically,  #3 . Hyponatremia, resolved #4. Elevated bilirubin, recheck in the morning, likely fasting state related #5. Acute gastroenteritis, resolved, supportive therapy, restarted on diabetic diet, follow closely  Management plans discussed with the patient, family and they are in agreement.   DRUG ALLERGIES: No Known Allergies  CODE STATUS:     Code Status Orders        Start     Ordered   09/09/16 1956  Full code  Continuous     09/09/16 1955    Code Status History    Date Active Date Inactive Code Status Order ID Comments User Context   06/01/2016  7:26 PM 06/02/2016  6:35 PM Full Code [id number]  Ramonita LabAruna Gouru, MD Inpatient      TOTAL TIME TAKING CARE OF THIS PATIENT: 40  minutes.    Katharina CaperVAICKUTE,Mahalie Kanner M.D on 09/10/2016 at 4:28 PM  Between 7am to 6pm - Pager - [phone]  After 6pm go to [email or web address] - password [email or web address]  [facility]  Office  [phone]  CC: Primary care physician; No PCP Per Patient

## 2016-09-11 DIAGNOSIS — K529 Noninfective gastroenteritis and colitis, unspecified: Secondary | ICD-10-CM

## 2016-09-11 DIAGNOSIS — E871 Hypo-osmolality and hyponatremia: Secondary | ICD-10-CM

## 2016-09-11 DIAGNOSIS — E860 Dehydration: Secondary | ICD-10-CM

## 2016-09-11 DIAGNOSIS — R17 Unspecified jaundice: Secondary | ICD-10-CM

## 2016-09-11 HISTORY — DX: Noninfective gastroenteritis and colitis, unspecified: K52.9

## 2016-09-11 HISTORY — DX: Unspecified jaundice: R17

## 2016-09-11 HISTORY — DX: Hypo-osmolality and hyponatremia: E87.1

## 2016-09-11 HISTORY — DX: Dehydration: E86.0

## 2016-09-11 LAB — CBC
HCT: 33.7 % — ABNORMAL LOW (ref 40.0–52.0)
Hemoglobin: 11.2 g/dL — ABNORMAL LOW (ref 13.0–18.0)
MCH: 26 pg (ref 26.0–34.0)
MCHC: 33.3 g/dL (ref 32.0–36.0)
MCV: 78.3 fL — AB (ref 80.0–100.0)
Platelets: 261 10*3/uL (ref 150–440)
RBC: 4.3 MIL/uL — ABNORMAL LOW (ref 4.40–5.90)
RDW: 12.9 % (ref 11.5–14.5)
WBC: 4.6 10*3/uL (ref 3.8–10.6)

## 2016-09-11 LAB — COMPREHENSIVE METABOLIC PANEL
ALBUMIN: 1.6 g/dL — AB (ref 3.5–5.0)
ALK PHOS: 71 U/L (ref 38–126)
ALT: 11 U/L — AB (ref 17–63)
AST: 16 U/L (ref 15–41)
Anion gap: 6 (ref 5–15)
BUN: 10 mg/dL (ref 6–20)
CHLORIDE: 104 mmol/L (ref 101–111)
CO2: 25 mmol/L (ref 22–32)
CREATININE: 0.41 mg/dL — AB (ref 0.61–1.24)
Calcium: 8 mg/dL — ABNORMAL LOW (ref 8.9–10.3)
GFR calc non Af Amer: >60 mL/min (ref >60)
GLUCOSE: 203 mg/dL — AB (ref 65–99)
Potassium: 3.7 mmol/L (ref 3.5–5.1)
SODIUM: 135 mmol/L (ref 135–145)
Total Bilirubin: 0.4 mg/dL (ref 0.3–1.2)
Total Protein: 5.4 g/dL — ABNORMAL LOW (ref 6.5–8.1)

## 2016-09-11 LAB — HIV ANTIBODY (ROUTINE TESTING W REFLEX): HIV Screen 4th Generation wRfx: NONREACTIVE

## 2016-09-11 LAB — GLUCOSE, CAPILLARY
Glucose-Capillary: 186 mg/dL — ABNORMAL HIGH (ref 65–99)
Glucose-Capillary: 186 mg/dL — ABNORMAL HIGH (ref 65–99)
Glucose-Capillary: 194 mg/dL — ABNORMAL HIGH (ref 65–99)

## 2016-09-11 MED ORDER — POTASSIUM CHLORIDE 20 MEQ PO PACK: 20.0000 meq | PACK | Freq: Two times a day (BID) | ORAL | Status: DC

## 2016-09-11 MED ORDER — POTASSIUM CHLORIDE IN NACL 20-0.9 MEQ/L-% IV SOLN
INTRAVENOUS | Status: DC
  Administered 2016-09-11: 10:00:00 via INTRAVENOUS
  Filled 2016-09-11 (×3): qty 1000

## 2016-09-11 MED ORDER — AMOXICILLIN-POT CLAVULANATE 875-125 MG PO TABS: 1.0000 | ORAL_TABLET | Freq: Two times a day (BID) | ORAL | 0 refills | Status: DC

## 2016-09-11 MED ORDER — INSULIN GLARGINE 100 UNIT/ML ~~LOC~~ SOLN: 10.0000 [IU] | Freq: Two times a day (BID) | SUBCUTANEOUS | 11 refills | Status: DC

## 2016-09-11 NOTE — Progress Notes (Signed)
MD ordered patient to be discharged home.  Discharge instructions were reviewed with the patient and he voiced understanding.  Follow-up appointments were made.  Prescriptions given to the patient.  IVs were removed with catheter intact.  Foam dressing and interdry were placed on the patients groin area and patient educated about changing this at home.  All patients questions were answered.  Patient left via wheelchair escorted by myself.

## 2016-09-11 NOTE — Progress Notes (Signed)
Talked to Dr. Winona LegatoVaickute about patient having two separate orders for oral poatssium.  She gave the verbal order to discontinue both oral orders and to order IV fluids with potassium.  Orders were placed.

## 2016-09-11 NOTE — Consult Note (Addendum)
WOC Nurse wound consult note Reason for Consult: right groin medical device related pressure injury (MDRPI) with abscess.  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  History of DM with right leg AKA.  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  (Interdry AG) Pressure Injury POA: Yes Measurement:0.3 cm x 1.2 cm x 0.1 nonintact skin with surrounding erythema and induration, extending 5 cm x 5 cm circumferentially Wound JWJ:XBJY and nongranulating  No odor.   Drainage (amount, consistency, odor) None noted Periwound:Induration and erythema  Tender to touch Dressing procedure/placement/frequency:Cleanse wound to right groin with NS and pat gently dry.  Will apply silicone border foam dressing and change every 3 days and PRN soilage.  Discussed need to pad and protect this area from pressure and shearing from prosthesis.  Instructed to inform his prosthotist or OT for additional interventions going forward. For right inguinal skin fold:   Measure and cut length of InterDry Ag+ to fit in skin folds that have skin breakdown Tuck InterDry  Ag+ fabric into skin folds in a single layer, allow for 2 inches of overhang from skin edges to allow for wicking to occur May remove to bathe; dry area thoroughly and then tuck into affected areas again Do not apply any creams or ointments when using InterDry Ag+ DO NOT THROW AWAY FOR 5 DAYS unless soiled with stool DO NOT [facility] product, this will inactivate the silver in the material  New sheet of Interdry Ag+ should be applied after 5 days of use if patient continues to have skin breakdown   Discussed need to pad and protect to avoid further MDRPI to this area.  Will not follow at this time.  Please re-consult if needed.  Maple Hudson RN BSN CWON Pager [phone]

## 2016-09-11 NOTE — Progress Notes (Signed)
CC: Right groin mass Subjective: This patient with a right groin mass believed to be phlegmon without signs of abscess. No sign of abscess. At this point patient states his pain is gone and he's had no fevers or chills and no drainage. Objective: Vital signs in last 24 hours: Temp:  [97.7 F (36.5 C)-98.7 F (37.1 C)] 98.6 F (37 C) (03/07 1216) Pulse Rate:  [84-107] 88 (03/07 1216) Resp:  [14-16] 14 (03/07 0605) BP: (139-170)/(79-99) 151/82 (03/07 1216) SpO2:  [97 %-100 %] 98 % (03/07 1216) Last BM Date: 09/08/16  Intake/Output from previous day: 03/06 0701 - 03/07 0700 In: 3085.5 [P.O.:840; I.V.:1945.5; IV Piggyback:300] Out: 2650 [Urine:2650] Intake/Output this shift: Total I/O In: 603 [I.V.:603] Out: 800 [Urine:800]  Physical exam:  Less erythema still with indurated mass in right groin above AKA. It is nontender nonfluctuant  Lab Results: CBC   Recent Labs  09/09/16 1413 09/11/16 0428  WBC 7.4 4.6  HGB 12.4* 11.2*  HCT 38.0* 33.7*  PLT 298 261   BMET  Recent Labs  09/10/16 0511 09/11/16 0428  NA 137 135  K 4.1 3.7  CL 108 104  CO2 21* 25  GLUCOSE 114* 203*  BUN 13 10  CREATININE 0.71 0.41*  CALCIUM 7.9* 8.0*   PT/INR No results for input(s): LABPROT, INR in the last 72 hours. ABG  Recent Labs  09/09/16 1601  HCO3 11.1*    Studies/Results: [place] Rt Lower Extrem Ltd Soft Tissue Non Vascular  Result Date: 09/09/2016 CLINICAL DATA:  Right inguinal mass x2 weeks, prosthetic leg EXAM: ULTRASOUND RIGHT LOWER EXTREMITY LIMITED TECHNIQUE: Ultrasound examination of the lower extremity soft tissues was performed in the area of clinical concern. COMPARISON:  None. FINDINGS: 1.5 x 2.5 cm focal area of phlegmonous change in the right inguinal region. While this does not currently reflect a thick-walled fluid collection, an early/developing abscess is possible. Surrounding extensive subcutaneous edema. Two right inguinal nodes measuring up to 9 mm short axis in  the right inguinal region, likely reactive. Otherwise, no sonographic abnormality is seen. IMPRESSION: 1.5 x 2.5 cm focal area of phlegmonous change in the right inguinal region, early/developing subcutaneous abscess not excluded. Surrounding subcutaneous edema. Two right inguinal nodes measuring up to 9 mm short axis, likely reactive. Electronically Signed   By: Charline Bills M.D.   On: 09/09/2016 17:06    Anti-infectives: Anti-infectives    Start     Dose/Rate Route Frequency Ordered Stop   09/11/16 0000  amoxicillin-clavulanate (AUGMENTIN) 875-125 MG tablet     1 tablet Oral 2 times daily 09/11/16 1247     09/10/16 0000  Ampicillin-Sulbactam (UNASYN) 3 g in sodium chloride 0.9 % 100 mL IVPB     3 g 200 mL/hr over 30 Minutes Intravenous Every 6 hours 09/09/16 1837     09/09/16 1700  piperacillin-tazobactam (ZOSYN) IVPB 3.375 g     3.375 g 100 mL/hr over 30 Minutes Intravenous  Once 09/09/16 1650 09/09/16 1753   09/09/16 1700  vancomycin (VANCOCIN) IVPB 1000 mg/200 mL premix     1,000 mg 200 mL/hr over 60 Minutes Intravenous  Once 09/09/16 1650 09/09/16 1913      Assessment/Plan:  Indurated mass of the right groin no sign of abscess at this time no sign of surgically drainable pus. We'll continue to observe on IV antibiotics and if no surgical indication exist tomorrow then discharged on oral antibiotics can be performed. His white blood cell count remains normal and he is afebrile without signs  of abscess.  Lattie Hawichard E Cooper, MD, FACS  09/11/2016

## 2016-09-11 NOTE — Progress Notes (Signed)
Inpatient Diabetes Program Recommendations  AACE/ADA: New Consensus Statement on Inpatient Glycemic Control (2015)  Target Ranges:  Prepandial:   less than 140 mg/dL      Peak postprandial:   less than 180 mg/dL (1-2 hours)      Critically ill patients:  140 - 180 mg/dL     Review of Glycemic ControlResults for [name], [name] (MRN [id number]) as of 09/11/2016 09:50  Ref. Range 09/10/2016 11:33 09/10/2016 16:33 09/10/2016 22:02 09/11/2016 03:12 09/11/2016 07:50  Glucose-Capillary Latest Ref Range: 65 - 99 mg/dL 102200 (H) 725218 (H) 366298 (H) 186 (H) 186 (H)   Diabetes history: Type 1 diabetes Outpatient Diabetes medications: Novolog 12 units tid with meals, Lantus 10 units q HS Current orders for Inpatient glycemic control:  Lantus 10 units bid, Novolog 4 units tid with meals, Novolog moderate tid with meals   Inpatient Diabetes Program Recommendations:    Consider increasing Novolog meal coverage to 6 units tid with meals.  Thanks,  Beryl MeagerJenny Eve Rey, RN, BC-ADM Inpatient Diabetes Coordinator Pager [phone] (8a-5p)

## 2016-09-11 NOTE — Care Management (Signed)
Plan for patient to discharge home today.  PT has assessed patient and he was ambulate with walker.  Patient states that he has access to 2 walkers, and his brother will bring one of them to the house tonight.  PT has assessed patient.  Nakyia Dau from PT verbalized to be that patient would benefit from home health .  Patient has self pay and does not have any coverage for home health PT.  Information provided for [facility].  Patient to Follow up [facility]/20, and denies any issues obtaining his medications.  RNCM signing off.

## 2016-09-11 NOTE — Progress Notes (Signed)
MEDICATION RELATED CONSULT NOTE   Pharmacy Consult for Electrolyte management in DKA  Indication: DKA   No Known Allergies  Patient Measurements: Height: 5\' 6"  (167.6 cm) Weight: 162 lb 14.7 oz (73.9 kg) IBW/kg (Calculated) : 63.8 Adjusted Body Weight:   Vital Signs: Temp: 98.4 F (36.9 C) (03/07 0007) Temp Source: Oral (03/07 0007) BP: 139/85 (03/07 0007) Pulse Rate: 90 (03/07 0007) Intake/Output from previous day: 03/06 0701 - 03/07 0700 In: 3085.5 [P.O.:840; I.V.:1945.5; IV Piggyback:300] Out: 2650 [Urine:2650] Intake/Output from this shift: Total I/O In: 1374.4 [P.O.:480; I.V.:894.4] Out: 1650 [Urine:1650]  Labs:  Recent Labs  09/09/16 1413  09/10/16 0058 09/10/16 0511 09/11/16 0428  WBC 7.4  --   --   --  4.6  HGB 12.4*  --   --   --  11.2*  HCT 38.0*  --   --   --  33.7*  PLT 298  --   --   --  261  CREATININE 1.44*  < > 0.88 0.71 0.41*  MG  --   --  1.7  --   --   PHOS  --   --  1.8*  --   --   ALBUMIN 2.0*  --   --   --  1.6*  PROT 6.4*  --   --   --  5.4*  AST 16  --   --   --  16  ALT 16*  --   --   --  11*  ALKPHOS 104  --   --   --  71  BILITOT 1.5*  --   --   --  0.4  < > = values in this interval not displayed. Estimated Creatinine Clearance: 118.5 mL/min (by C-G formula based on SCr of 0.41 mg/dL (L)).   Microbiology: Recent Results (from the past 720 hour(s))  MRSA PCR Screening     Status: None   Collection Time: 09/09/16  7:50 PM  Result Value Ref Range Status   MRSA by PCR NEGATIVE NEGATIVE Final    Comment:        The GeneXpert MRSA Assay (FDA approved for NASAL specimens only), is one component of a comprehensive MRSA colonization surveillance program. It is not intended to diagnose MRSA infection nor to guide or monitor treatment for MRSA infections.     Medical History: Past Medical History:  Diagnosis Date  . Diabetes mellitus without complication (HCC)     Medications:  Prescriptions Prior to Admission   Medication Sig Dispense Refill Last Dose  . aspirin EC 81 MG tablet Take 81 mg by mouth daily.     Marland Kitchen. atorvastatin (LIPITOR) 40 MG tablet Take 40 mg by mouth daily.     . insulin aspart (NOVOLOG) 100 UNIT/ML injection Inject 20 Units into the skin 3 (three) times daily before meals. 10 mL 11 09/08/2016 at Unknown time  . insulin glargine (LANTUS) 100 UNIT/ML injection Inject 10 Units into the skin at bedtime.    09/08/2016 at Unknown time  . insulin NPH-regular Human (NOVOLIN 70/30) (70-30) 100 UNIT/ML injection Inject 16-32 Units into the skin 2 (two) times daily. Take 32 units before breakfast and 16 units before dinner.     . insulin lispro (HUMALOG) 100 UNIT/ML injection Inject 0.2 mLs (20 Units total) into the skin once. 10 mL 11    Scheduled:  . ampicillin-sulbactam (UNASYN) IV  3 g Intravenous Q6H  . aspirin EC  81 mg Oral Daily  . atorvastatin  40  mg Oral Daily  . heparin  5,000 Units Subcutaneous Q8H  . insulin aspart  0-15 Units Subcutaneous TID WC  . insulin aspart  0-5 Units Subcutaneous QHS  . insulin aspart  4 Units Subcutaneous TID WC  . insulin glargine  10 Units Subcutaneous BID  . potassium chloride  20 mEq Oral BID WC  . potassium chloride  20 mEq Oral BID    Assessment: Pharmacy consulted to assist in the management in this 31 year old DKA patient that's currently on Insulin gtt.     Goal of Therapy:  K= 4.0  Plan:  K is within goal range. Will check electrolytes with am labs.   3/7 AM K+ 3.7. KCl 20 mEq x 2 doses ordered. Recheck BMP with tomorrow AM labs.   Clovia Cuff, PharmD, BCPS 09/11/2016 5:24 AM

## 2016-09-11 NOTE — Evaluation (Signed)
Physical Therapy Evaluation Patient Details Name: [name] MRN: [id number] DOB: 12/16/1985 Today's Date: 09/11/2016   History of Present Illness  Pt admitted for diabetic acidosis without coma. Pt with complaints of headache and dizziness and also diagnosed with acute gastroenteritis. Of note, pt also noticed R groin mass where prosthesis sits. History includes DM and R AKA. Per orders, pt to not wear prosthetic as it is pinching groin.  Clinical Impression  Pt is a pleasant 31 year old male who was admitted for diabetic acidosis without coma.  Pt demonstrates all bed mobility/transfers/ambulation using RW and able to hop on L LE with safe technique. Pt has RW in home for use until he is allowed to don prosthesis. Pt safe to dc home. Pt does not require any further PT needs at this time. Pt will be dc in house and does not require follow up. RN aware. Will dc current orders.      Follow Up Recommendations No PT follow up    Equipment Recommendations  None recommended by PT (has RW)    Recommendations for Other Services       Precautions / Restrictions Precautions Precautions: Fall Restrictions Weight Bearing Restrictions: No      Mobility  Bed Mobility Overal bed mobility: Independent             General bed mobility comments: safe technique with mobility. Once seated at EOB, pt able to sit with upright posture  Transfers Overall transfer level: Modified independent Equipment used: Rolling walker (2 wheeled)             General transfer comment: safe technique performed with RW and upright posture.   Ambulation/Gait Ambulation/Gait assistance: Min guard Ambulation Distance (Feet): 40 Feet Assistive device: Rolling walker (2 wheeled) Gait Pattern/deviations: Step-to pattern     General Gait Details: Pt ambulated using RW and hopping on L LE. Safe technique performed with correct use of RW. Slow gait pattern noted  Stairs            Wheelchair  Mobility    Modified Rankin (Stroke Patients Only)       Balance Overall balance assessment: Independent                                           Pertinent Vitals/Pain Pain Assessment: No/denies pain    Home Living Family/patient expects to be discharged to:: Private residence Living Arrangements:  (with friend) Available Help at Discharge: Friend(s) Type of Home: House Home Access: Ramped entrance     Home Layout: One level Home Equipment: Environmental consultantWalker - 2 wheels      Prior Function Level of Independence: Independent         Comments: used prosthesis with R AKA and does not require any AD at this time     Hand Dominance        Extremity/Trunk Assessment   Upper Extremity Assessment Upper Extremity Assessment: Overall WFL for tasks assessed    Lower Extremity Assessment Lower Extremity Assessment: Overall WFL for tasks assessed       Communication   Communication: No difficulties  Cognition Arousal/Alertness: Awake/alert Behavior During Therapy: WFL for tasks assessed/performed Overall Cognitive Status: Within Functional Limits for tasks assessed                      General Comments      Exercises  Assessment/Plan    PT Assessment Patent does not need any further PT services  PT Problem List         PT Treatment Interventions      PT Goals (Current goals can be found in the Care Plan section)  Acute Rehab PT Goals Patient Stated Goal: to go home PT Goal Formulation: All assessment and education complete, DC therapy Time For Goal Achievement: 10-08-16 Potential to Achieve Goals: Good    Frequency     Barriers to discharge        Co-evaluation               End of Session Equipment Utilized During Treatment: Gait belt Activity Tolerance: Patient tolerated treatment well Patient left: in bed Nurse Communication: Mobility status PT Visit Diagnosis: Muscle weakness (generalized) (M62.81)          Time: 6962-9528 PT Time Calculation (min) (ACUTE ONLY): 18 min   Charges:   PT Evaluation $PT Eval Low Complexity: 1 Procedure     PT G Codes:         Kathya Wilz October 08, 2016, 5:05 PM Elizabeth Palau, PT, DPT [phone]

## 2016-09-11 NOTE — Discharge Summary (Signed)
[facility] [place] [facility]   PATIENT NAME: [name]    MR#:  [id number]  DATE OF BIRTH:  11/04/1985  DATE OF ADMISSION:  09/09/2016 ADMITTING PHYSICIAN: Altamese DillingVaibhavkumar Vachhani, MD  DATE OF DISCHARGE: 09/11/2016  3:54 PM  PRIMARY CARE PHYSICIAN: No PCP Per Patient     ADMISSION DIAGNOSIS:  Mass of right inguinal region [R19.09] Abscess of right thigh [L02.415] Diabetic ketoacidosis without coma associated with other specified diabetes mellitus (HCC) [E13.10]  DISCHARGE DIAGNOSIS:  Principal Problem:   Diabetic acidosis without coma (HCC) Active Problems:   Abscess of groin, right   Mass of right inguinal region   Hyponatremia   Dehydration   Acute gastroenteritis   Elevated bilirubin   SECONDARY DIAGNOSIS:   Past Medical History:  Diagnosis Date  . Diabetes mellitus without complication (HCC)     .pro HOSPITAL COURSE:   Patient is 31 year old  African American male with past medical history significant history of diabetes mellitus, who presents to the hospital with headache, dizziness, abdominal pain.  He was noted to be in DKA and was admitted. He was noted to have right groin swelling, concerning for abscess. Ultrasound revealed phlegmonous changes. Patient was seen by surgeon, who recommended antibiotic therapy, surgical therapy if medical therapy fails,  The patient was initiated on Unasyn and his condition improved, fever subsided, white blood cell count has improved. There were no clinical signs of fluctuation, concerning for abscess, patient was advised to continue antibiotic therapy and follow up with Dr. Excell Seltzerooper, surgeon as outpatient. He was felt to be stable to be discharged home today . Discussion by problem: #1. DKA in  type I  Diabetic patient , continue patient on Insulin Lantus, now on advanced dose of 10 units twice a day,  and tinea meal coverage and sliding scale insulin with NovoLog,  Bicarbonate level has improved,  clinically improved, follow-up and establish primary care physicianand be seen within one week after discharge, patient may benefit from diabetic outpatient center follow-up #2 . Right groin phlegmon, . No frank abscess, continue  Augmentin, MRSA PCR was negative, . The patient clinically improved, white blood cell count has improved, as well as fever curve, patient is to follow-up with surgeon, Dr. Excell Seltzerooper within 1 week after discharge, discussed with Dr. Excell Seltzerooper.  #3 . Hyponatremia, resolved #4. Elevated bilirubin,  fasting state related, normalized #5. Acute gastroenteritis, resolved with  supportive therapy, restarted on diabetic diet, . No recurrence  DISCHARGE CONDITIONS:   . Stable  CONSULTS OBTAINED:  Treatment Team:  Lattie Hawichard E Cooper, MD  DRUG ALLERGIES:  No Known Allergies  DISCHARGE MEDICATIONS:   Discharge Medication List as of 09/11/2016  2:48 PM    START taking these medications   Details  amoxicillin-clavulanate (AUGMENTIN) 875-125 MG tablet Take 1 tablet by mouth 2 (two) times daily., Starting Wed 09/11/2016, Print      CONTINUE these medications which have CHANGED   Details  insulin glargine (LANTUS) 100 UNIT/ML injection Inject 0.1 mLs (10 Units total) into the skin 2 (two) times daily., Starting Wed 09/11/2016, Print      CONTINUE these medications which have NOT CHANGED   Details  aspirin EC 81 MG tablet Take 81 mg by mouth daily., Starting Wed 12/20/2015, Historical Med    atorvastatin (LIPITOR) 40 MG tablet Take 40 mg by mouth daily., Starting Sun 12/17/2015, Until Mon 12/16/2016, Historical Med    insulin aspart (NOVOLOG) 100 UNIT/ML injection Inject 20 Units into the skin 3 (three)  times daily before meals., Starting Sun 06/02/2016, Print      STOP taking these medications     insulin NPH-regular Human (NOVOLIN 70/30) (70-30) 100 UNIT/ML injection      insulin lispro (HUMALOG) 100 UNIT/ML injection          DISCHARGE INSTRUCTIONS:    . The patient is  to follow-up with Dr. Excell Seltzer within 1 week after discharge  If you experience worsening of your admission symptoms, develop shortness of breath, life threatening emergency, suicidal or homicidal thoughts you must seek medical attention immediately by calling 911 or calling your MD immediately  if symptoms less severe.  You Must read complete instructions/literature along with all the possible adverse reactions/side effects for all the Medicines you take and that have been prescribed to you. Take any new Medicines after you have completely understood and accept all the possible adverse reactions/side effects.   Please note  You were cared for by a hospitalist during your hospital stay. If you have any questions about your discharge medications or the care you received while you were in the hospital after you are discharged, you can call the unit and asked to speak with the hospitalist on call if the hospitalist that took care of you is not available. Once you are discharged, your primary care physician will handle any further medical issues. Please note that NO REFILLS for any discharge medications will be authorized once you are discharged, as it is imperative that you return to your primary care physician (or establish a relationship with a primary care physician if you do not have one) for your aftercare needs so that they can reassess your need for medications and monitor your lab values.    Today   CHIEF COMPLAINT:   Chief Complaint  Patient presents with  . Headache  . Dizziness  . Abdominal Pain    HISTORY OF PRESENT ILLNESS:  [name]  is a 31 y.o. male with a known history of diabetes mellitus, who presents to the hospital with headache, dizziness, abdominal pain.  He was noted to be in DKA and was admitted. He was noted to have right groin swelling, concerning for abscess. Ultrasound revealed phlegmonous changes. Patient was seen by surgeon, who recommended antibiotic therapy,  surgical therapy if medical therapy fails,  The patient was initiated on Unasyn and his condition improved, fever subsided, white blood cell count has improved. There were no clinical signs of fluctuation, concerning for abscess, patient was advised to continue antibiotic therapy and follow up with Dr. Excell Seltzer, surgeon as outpatient. He was felt to be stable to be discharged home today . Discussion by problem: #1. DKA in  type I  Diabetic patient , continue patient on Insulin Lantus, now on advanced dose of 10 units twice a day,  and tinea meal coverage and sliding scale insulin with NovoLog,  Bicarbonate level has improved, clinically improved, follow-up and establish primary care physicianand be seen within one week after discharge, patient may benefit from diabetic outpatient center follow-up #2 . Right groin phlegmon, . No frank abscess, continue  Augmentin, MRSA PCR was negative, . The patient clinically improved, white blood cell count has improved, as well as fever curve, patient is to follow-up with surgeon, Dr. Excell Seltzer within 1 week after discharge, discussed with Dr. Excell Seltzer.  #3 . Hyponatremia, resolved #4. Elevated bilirubin,  fasting state related, normalized #5. Acute gastroenteritis, resolved with  supportive therapy, restarted on diabetic diet, . No recurrence     VITAL  SIGNS:  Blood pressure (!) 151/82, pulse 88, temperature 98.6 F (37 C), temperature source Oral, resp. rate 14, height 5\' 6"  (1.676 m), weight 73.9 kg (162 lb 14.7 oz), SpO2 98 %.  I/O:   Intake/Output Summary (Last 24 hours) at 09/11/16 1742 Last data filed at 09/11/16 1523  Gross per 24 hour  Intake          2217.36 ml  Output             3450 ml  Net         -1232.64 ml    PHYSICAL EXAMINATION:  GENERAL:  31 y.o.-year-old patient lying in the bed with no acute distress.  EYES: Pupils equal, round, reactive to light and accommodation. No scleral icterus. Extraocular muscles intact.  HEENT: Head atraumatic,  normocephalic. Oropharynx and nasopharynx clear.  NECK:  Supple, no jugular venous distention. No thyroid enlargement, no tenderness.  LUNGS: Normal breath sounds bilaterally, no wheezing, rales,rhonchi or crepitation. No use of accessory muscles of respiration.  CARDIOVASCULAR: S1, S2 normal. No murmurs, rubs, or gallops.  ABDOMEN: Soft, non-tender, non-distended. Bowel sounds present. No organomegaly or mass.  EXTREMITIES: No pedal edema, cyanosis, or clubbing. . Right groin swelling with no fluctuation was noted some discomfort on palpation, no significant erythema or increased warmth was noted NEUROLOGIC: Cranial nerves II through XII are intact. Muscle strength 5/5 in all extremities. Sensation intact. Gait not checked.  PSYCHIATRIC: The patient is alert and oriented x 3.  SKIN: No obvious rash, lesion, or ulcer.   DATA REVIEW:   CBC  Recent Labs Lab 09/11/16 0428  WBC 4.6  HGB 11.2*  HCT 33.7*  PLT 261    Chemistries   Recent Labs Lab 09/10/16 0058  09/11/16 0428  NA 136  < > 135  K 3.5  < > 3.7  CL 108  < > 104  CO2 22  < > 25  GLUCOSE 144*  < > 203*  BUN 14  < > 10  CREATININE 0.88  < > 0.41*  CALCIUM 7.7*  < > 8.0*  MG 1.7  --   --   AST  --   --  16  ALT  --   --  11*  ALKPHOS  --   --  71  BILITOT  --   --  0.4  < > = values in this interval not displayed.  Cardiac Enzymes No results for input(s): TROPONINI in the last 168 hours.  Microbiology Results  Results for orders placed or performed during the hospital encounter of 09/09/16  MRSA PCR Screening     Status: None   Collection Time: 09/09/16  7:50 PM  Result Value Ref Range Status   MRSA by PCR NEGATIVE NEGATIVE Final    Comment:        The GeneXpert MRSA Assay (FDA approved for NASAL specimens only), is one component of a comprehensive MRSA colonization surveillance program. It is not intended to diagnose MRSA infection nor to guide or monitor treatment for MRSA infections.      RADIOLOGY:  No results found.  EKG:   Orders placed or performed during the hospital encounter of 06/01/16  . EKG 12-Lead  . EKG 12-Lead      Management plans discussed with the patient, family and they are in agreement.  CODE STATUS:     Code Status Orders        Start     Ordered   09/09/16 1956  Full code  Continuous     09/09/16 1955    Code Status History    Date Active Date Inactive Code Status Order ID Comments User Context   06/01/2016  7:26 PM 06/02/2016  6:35 PM Full Code [id number]  Ramonita Lab, MD Inpatient      TOTAL TIME TAKING CARE OF THIS PATIENT: . 40 minutes.    Katharina Caper M.D on 09/11/2016 at 5:42 PM  Between 7am to 6pm - Pager - [phone]  After 6pm go to [email or web address] - password [email or web address] [facility]  [facility]  Office  [phone]  CC: Primary care physician; No PCP Per Patient

## 2016-09-11 NOTE — Discharge Instructions (Signed)
Cleanse wound to right groin with NS and pat gently dry. Will apply silicone border foam dressing and change every 3 days and PRN soilage   Measure and cut length of InterDry Ag+ to fit in skin folds that have skin breakdown Tuck InterDry Ag+ fabric into skin folds in a single layer, allow for 2 inches of overhang from skin edges to allow for wicking to occur May remove to bathe; dry area thoroughly and then tuck into affected areas again Do not apply any creams or ointments when using InterDry Ag+ DO NOT THROW AWAY FOR 5 DAYS unless soiled with stool DO NOT [facility] product, this will inactivate the silver in the material  New sheet of Interdry Ag+ should be applied after 5 days of use if patient continues to have skin breakdown

## 2016-09-12 ENCOUNTER — Other Ambulatory Visit: Payer: Self-pay

## 2016-09-12 LAB — HEMOGLOBIN A1C
Hgb A1c MFr Bld: >15.5 % — ABNORMAL HIGH (ref 4.8–5.6)
Mean Plasma Glucose: >398 mg/dL

## 2016-09-20 ENCOUNTER — Inpatient Hospital Stay: Payer: Self-pay | Admitting: Surgery

## 2017-03-24 ENCOUNTER — Emergency Department
Admission: EM | Admit: 2017-03-24 | Discharge: 2017-03-24 | Disposition: A | Discharge status: Home / Self Care | Payer: Medicaid Other | Attending: Emergency Medicine | Admitting: Emergency Medicine

## 2017-03-24 ENCOUNTER — Encounter: Payer: Self-pay | Admitting: Emergency Medicine

## 2017-03-24 DIAGNOSIS — R739 Hyperglycemia, unspecified: Secondary | ICD-10-CM

## 2017-03-24 DIAGNOSIS — E1065 Type 1 diabetes mellitus with hyperglycemia: Secondary | ICD-10-CM | POA: Insufficient documentation

## 2017-03-24 DIAGNOSIS — Z7982 Long term (current) use of aspirin: Secondary | ICD-10-CM | POA: Diagnosis not present

## 2017-03-24 DIAGNOSIS — Z794 Long term (current) use of insulin: Secondary | ICD-10-CM | POA: Diagnosis not present

## 2017-03-24 DIAGNOSIS — Z79899 Other long term (current) drug therapy: Secondary | ICD-10-CM | POA: Insufficient documentation

## 2017-03-24 DIAGNOSIS — I10 Essential (primary) hypertension: Secondary | ICD-10-CM | POA: Insufficient documentation

## 2017-03-24 DIAGNOSIS — R2242 Localized swelling, mass and lump, left lower limb: Secondary | ICD-10-CM | POA: Diagnosis not present

## 2017-03-24 DIAGNOSIS — R600 Localized edema: Secondary | ICD-10-CM | POA: Diagnosis present

## 2017-03-24 DIAGNOSIS — N5089 Other specified disorders of the male genital organs: Secondary | ICD-10-CM | POA: Insufficient documentation

## 2017-03-24 DIAGNOSIS — R609 Edema, unspecified: Secondary | ICD-10-CM

## 2017-03-24 LAB — URINALYSIS, COMPLETE (UACMP) WITH MICROSCOPIC
BILIRUBIN URINE: NEGATIVE
Bacteria, UA: NONE SEEN
KETONES UR: NEGATIVE mg/dL
Leukocytes, UA: NEGATIVE
NITRITE: NEGATIVE
PH: 6 (ref 5.0–8.0)
Protein, ur: >=300 mg/dL — AB
Specific Gravity, Urine: 1.025 (ref 1.005–1.030)
Squamous Epithelial / HPF: NONE SEEN

## 2017-03-24 LAB — BASIC METABOLIC PANEL
Anion gap: 8 (ref 5–15)
BUN: 19 mg/dL (ref 6–20)
CO2: 26 mmol/L (ref 22–32)
Calcium: 8.3 mg/dL — ABNORMAL LOW (ref 8.9–10.3)
Chloride: 96 mmol/L — ABNORMAL LOW (ref 101–111)
Creatinine, Ser: 1.31 mg/dL — ABNORMAL HIGH (ref 0.61–1.24)
Glucose, Bld: 451 mg/dL — ABNORMAL HIGH (ref 65–99)
Potassium: 3.5 mmol/L (ref 3.5–5.1)
Sodium: 130 mmol/L — ABNORMAL LOW (ref 135–145)

## 2017-03-24 LAB — HEPATIC FUNCTION PANEL
ALT: 27 U/L (ref 17–63)
AST: 46 U/L — AB (ref 15–41)
Albumin: 1.3 g/dL — ABNORMAL LOW (ref 3.5–5.0)
Alkaline Phosphatase: 170 U/L — ABNORMAL HIGH (ref 38–126)
Total Bilirubin: 0.3 mg/dL (ref 0.3–1.2)
Total Protein: 5.5 g/dL — ABNORMAL LOW (ref 6.5–8.1)

## 2017-03-24 LAB — CBC
HEMATOCRIT: 32.3 % — AB (ref 40.0–52.0)
Hemoglobin: 10.7 g/dL — ABNORMAL LOW (ref 13.0–18.0)
MCH: 25.5 pg — ABNORMAL LOW (ref 26.0–34.0)
MCHC: 33.1 g/dL (ref 32.0–36.0)
MCV: 77.1 fL — AB (ref 80.0–100.0)
PLATELETS: 312 10*3/uL (ref 150–440)
RBC: 4.19 MIL/uL — ABNORMAL LOW (ref 4.40–5.90)
RDW: 14.8 % — AB (ref 11.5–14.5)
WBC: 7.4 10*3/uL (ref 3.8–10.6)

## 2017-03-24 LAB — BRAIN NATRIURETIC PEPTIDE: B Natriuretic Peptide: 41 pg/mL (ref 0.0–100.0)

## 2017-03-24 MED ORDER — INSULIN ASPART 100 UNIT/ML ~~LOC~~ SOLN: 10.0000 [IU] | Freq: Once | SUBCUTANEOUS | Status: DC

## 2017-03-24 MED ORDER — FUROSEMIDE 40 MG PO TABS: 40.0000 mg | ORAL_TABLET | Freq: Every day | ORAL | 0 refills | Status: DC

## 2017-03-24 MED ORDER — INSULIN ASPART 100 UNIT/ML ~~LOC~~ SOLN
SUBCUTANEOUS | Status: AC
  Filled 2017-03-24: qty 1

## 2017-03-24 MED ORDER — INSULIN ASPART 100 UNIT/ML ~~LOC~~ SOLN
10.0000 [IU] | Freq: Once | SUBCUTANEOUS | Status: AC
  Administered 2017-03-24: 10 [IU] via SUBCUTANEOUS
  Filled 2017-03-24: qty 0.1

## 2017-03-24 NOTE — ED Triage Notes (Signed)
Pt in via POV with complaints of groin and genital swelling since Saturday, pt denies any injury/pain to area.  Pt is a diabetic w/ right AKA.  Pt hypertensive, denies hx of same.  A/Ox4, NAD noted at this time.

## 2017-03-24 NOTE — Discharge Instructions (Signed)
Please seek medical attention for any high fevers, chest pain, shortness of breath, change in behavior, persistent vomiting, bloody stool or any other new or concerning symptoms.  

## 2017-03-24 NOTE — ED Provider Notes (Signed)
[facility] Emergency Department Provider Note  ____________________________________________   I have reviewed the triage vital signs and the nursing notes.   HISTORY  Chief Complaint Leg Swelling and Groin Swelling   History limited by: Not Limited   HPI [name] is a 31 y.o. male who presents to the emergency department today because of concerns for leg and scrotal swelling. The patient states that he first noticed the symptoms today. He denies any pain associated with it. He feels like his whole leg is swollen as well as the scrotum. No difficulty with urination. The patient does have a history of diabetes. He denies any fevers. No headaches. 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    Past Medical History:  Diagnosis Date  . Abscess of groin, right 09/09/2016  . 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 Acute gastroenteritis 09/11/2016  . AKI (acute kidney injury) (HCC) 02/19/2013  . Anemia 12/14/2015  . Dehydration 09/11/2016  . Diabetes mellitus without complication (HCC)   . Diabetic acidosis without coma (HCC) 06/01/2016  . Elevated bilirubin 09/11/2016  . Essential hypertension with goal blood pressure less than 130/80 02/07/2015  . Hypercholesterolemia 02/25/2013  . Hyponatremia 09/11/2016  . Hypophosphatemia 12/14/2015  . Hypovitaminosis D 12/16/2015  . Mass of right inguinal region   . Microcytic anemia 11/17/2014  . Non-ST elevation myocardial infarction (NSTEMI) (HCC) 02/19/2013  . Poorly controlled type 1 diabetes mellitus (HCC) 03/03/2012   Overview:  Diagnosed 02/2012.  Now insulin-dependent  . Proteinuria 12/14/2015    Patient Active Problem List   Diagnosis Date Noted  . Dehydration 09/11/2016  . Elevated bilirubin 09/11/2016  . Acute gastroenteritis 09/11/2016  . Abscess of groin, right 09/09/2016  . Diabetic acidosis without coma (HCC)  06/01/2016  . Hypovitaminosis D 12/16/2015  . Anemia 12/14/2015  . Hypophosphatemia 12/14/2015  . 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 Essential hypertension with goal blood pressure less than 130/80 02/07/2015  . Microcytic anemia 11/17/2014  . Hypercholesterolemia 02/25/2013  . AKI (acute kidney injury) (HCC) 02/19/2013  . Non-ST elevation myocardial infarction (NSTEMI) (HCC) 02/19/2013  . Poorly controlled type 1 diabetes mellitus (HCC) 03/03/2012    Past Surgical History:  Procedure Laterality Date  . LEG AMPUTATION ABOVE KNEE Right     Prior to Admission medications   Medication Sig Start Date End Date Taking? Authorizing Provider  amoxicillin-clavulanate (AUGMENTIN) 875-125 MG tablet Take 1 tablet by mouth 2 (two) times daily. 09/11/16   Katharina Caper, MD  aspirin EC 81 MG tablet Take 81 mg by mouth daily. 12/20/15   [provider]  atorvastatin (LIPITOR) 40 MG tablet Take 40 mg by mouth daily. 12/17/15 12/16/16  [provider]  insulin aspart (NOVOLOG) 100 UNIT/ML injection Inject 20 Units into the skin 3 (three) times daily before meals. 06/02/16   Auburn Bilberry, MD  insulin glargine (LANTUS) 100 UNIT/ML injection Inject 0.1 mLs (10 Units total) into the skin 2 (two) times daily. 09/11/16   Katharina Caper, MD    Allergies Patient has no known allergies.  Family History  Problem Relation Age of Onset  . Diabetes Mother     Social History Social History  Substance Use Topics  . Smoking status: Never Smoker  . Smokeless tobacco: Never Used  . Alcohol use No    Review of Systems Constitutional: No fever/chills Eyes: No visual  changes. ENT: No sore throat. Cardiovascular: Denies chest pain. Respiratory: Denies shortness of breath. Gastrointestinal: No abdominal pain.  No nausea, no vomiting.  No diarrhea.   Genitourinary: Positive for scrotal swelling.  Musculoskeletal: Positive for left lower leg  swelling. Skin: Negative for rash. Neurological: Negative for headaches, focal weakness or numbness.  ____________________________________________   PHYSICAL EXAM:  VITAL SIGNS: ED Triage Vitals  Enc Vitals Group     BP 03/24/17 1121 (!) 183/109     Pulse Rate 03/24/17 1121 (!) 108     Resp 03/24/17 1121 16     Temp 03/24/17 1121 98.6 F (37 C)     Temp Source 03/24/17 1121 Oral     SpO2 --      Weight 03/24/17 1122 181 lb (82.1 kg)     Height 03/24/17 1122  (1.676 m)   Constitutional: Alert and oriented. Well appearing and in no distress. Eyes: Conjunctivae are normal.  ENT   Head: Normocephalic and atraumatic.   Nose: No congestion/rhinnorhea.   Mouth/Throat: Mucous membranes are moist.   Neck: No stridor. Hematological/Lymphatic/Immunilogical: No cervical lymphadenopathy. Cardiovascular: Normal rate, regular rhythm.  No murmurs, rubs, or gallops. Respiratory: Normal respiratory effort without tachypnea nor retractions. Breath sounds are clear and equal bilaterally. No wheezes/rales/rhonchi. Gastrointestinal: Soft and non tender. No rebound. No guarding.  Genitourinary: Scrotal swelling without warmth, skin changes or tenderness.  Musculoskeletal: Right AKA. Left leg with swelling. No tenderness. No erythema. No warmth. No blisters.  Neurologic:  Normal speech and language. No gross focal neurologic deficits are appreciated.  Skin:  Skin is warm, dry and intact. No rash noted. Psychiatric: Mood and affect are normal. Speech and behavior are normal. Patient exhibits appropriate insight and judgment.  ____________________________________________    LABS (pertinent positives/negatives)  Labs Reviewed  CBC - Abnormal; Notable for the following:       Result Value   RBC 4.19 (*)    Hemoglobin 10.7 (*)    HCT 32.3 (*)    MCV 77.1 (*)    MCH 25.5 (*)    RDW 14.8 (*)    All other components within normal limits  BASIC METABOLIC PANEL - Abnormal;  Notable for the following:    Sodium 130 (*)    Chloride 96 (*)    Glucose, Bld 451 (*)    Creatinine, Ser 1.31 (*)    Calcium 8.3 (*)    All other components within normal limits  URINALYSIS, COMPLETE (UACMP) WITH MICROSCOPIC - Abnormal; Notable for the following:    Color, Urine YELLOW (*)    APPearance HAZY (*)    Glucose, UA >=500 (*)    Hgb urine dipstick SMALL (*)    Protein, ur >=300 (*)    All other components within normal limits  HEPATIC FUNCTION PANEL - Abnormal; Notable for the following:    Total Protein 5.5 (*)    Albumin 1.3 (*)    AST 46 (*)    Alkaline Phosphatase 170 (*)    Bilirubin, Direct <0.1 (*)    All other components within normal limits  BRAIN NATRIURETIC PEPTIDE     ____________________________________________   EKG  None  ____________________________________________    RADIOLOGY  None  ____________________________________________   PROCEDURES  Procedures  ____________________________________________   INITIAL IMPRESSION / ASSESSMENT AND PLAN / ED COURSE  Pertinent labs & imaging results that were available during my care of the patient were reviewed by me and considered in my medical decision making (see chart for details).  patient presented to the emergency department today because of concerns for left leg swelling and scrotal swelling. On exam but the scrotum and left leg are swollen without any erythema, skin changes or tenderness to suggest infection. Patient's BNP was within normal limits. His hepatic function panel however did show a low albumin and low protein. I do wonder if this is contributing to the patient's swelling.he does appear to be slightly dehydrated however have a have some hesitation given cardiac present peripheral edema. Will plan on discharging with low-dose Lasix. Willencourage patient to have close follow-up with primary care.  ____________________________________________   FINAL CLINICAL IMPRESSION(S) /  ED DIAGNOSES  Final diagnoses:  Peripheral edema  Hyperglycemia     Note: This dictation was prepared with Dragon dictation. Any transcriptional errors that result from this process are unintentional     Phineas Semen, MD 03/24/17 1734

## 2017-04-14 ENCOUNTER — Emergency Department: Payer: Medicaid Other

## 2017-04-14 ENCOUNTER — Inpatient Hospital Stay
Admission: EM | Admit: 2017-04-14 | Discharge: 2017-04-18 | DRG: 699 | Disposition: A | Discharge status: Home / Self Care | Payer: Medicaid Other | Attending: Internal Medicine | Admitting: Internal Medicine

## 2017-04-14 ENCOUNTER — Encounter: Payer: Self-pay | Admitting: Emergency Medicine

## 2017-04-14 DIAGNOSIS — Z9114 Patient's other noncompliance with medication regimen: Secondary | ICD-10-CM

## 2017-04-14 DIAGNOSIS — I252 Old myocardial infarction: Secondary | ICD-10-CM

## 2017-04-14 DIAGNOSIS — N5089 Other specified disorders of the male genital organs: Secondary | ICD-10-CM | POA: Diagnosis present

## 2017-04-14 DIAGNOSIS — R000 Tachycardia, unspecified: Secondary | ICD-10-CM | POA: Diagnosis present

## 2017-04-14 DIAGNOSIS — E1065 Type 1 diabetes mellitus with hyperglycemia: Secondary | ICD-10-CM | POA: Diagnosis present

## 2017-04-14 DIAGNOSIS — E1021 Type 1 diabetes mellitus with diabetic nephropathy: Secondary | ICD-10-CM | POA: Diagnosis not present

## 2017-04-14 DIAGNOSIS — N182 Chronic kidney disease, stage 2 (mild): Secondary | ICD-10-CM | POA: Diagnosis present

## 2017-04-14 DIAGNOSIS — D631 Anemia in chronic kidney disease: Secondary | ICD-10-CM | POA: Diagnosis present

## 2017-04-14 DIAGNOSIS — E876 Hypokalemia: Secondary | ICD-10-CM | POA: Diagnosis present

## 2017-04-14 DIAGNOSIS — E1022 Type 1 diabetes mellitus with diabetic chronic kidney disease: Secondary | ICD-10-CM | POA: Diagnosis present

## 2017-04-14 DIAGNOSIS — Z89611 Acquired absence of right leg above knee: Secondary | ICD-10-CM

## 2017-04-14 DIAGNOSIS — R601 Generalized edema: Secondary | ICD-10-CM

## 2017-04-14 DIAGNOSIS — R748 Abnormal levels of other serum enzymes: Secondary | ICD-10-CM | POA: Diagnosis present

## 2017-04-14 DIAGNOSIS — I129 Hypertensive chronic kidney disease with stage 1 through stage 4 chronic kidney disease, or unspecified chronic kidney disease: Secondary | ICD-10-CM | POA: Diagnosis present

## 2017-04-14 DIAGNOSIS — E10649 Type 1 diabetes mellitus with hypoglycemia without coma: Secondary | ICD-10-CM | POA: Diagnosis present

## 2017-04-14 DIAGNOSIS — E8809 Other disorders of plasma-protein metabolism, not elsewhere classified: Secondary | ICD-10-CM | POA: Diagnosis present

## 2017-04-14 DIAGNOSIS — E785 Hyperlipidemia, unspecified: Secondary | ICD-10-CM | POA: Diagnosis present

## 2017-04-14 DIAGNOSIS — E10622 Type 1 diabetes mellitus with other skin ulcer: Secondary | ICD-10-CM | POA: Diagnosis present

## 2017-04-14 DIAGNOSIS — R778 Other specified abnormalities of plasma proteins: Secondary | ICD-10-CM

## 2017-04-14 DIAGNOSIS — Z833 Family history of diabetes mellitus: Secondary | ICD-10-CM

## 2017-04-14 DIAGNOSIS — L97929 Non-pressure chronic ulcer of unspecified part of left lower leg with unspecified severity: Secondary | ICD-10-CM | POA: Diagnosis present

## 2017-04-14 DIAGNOSIS — R7989 Other specified abnormal findings of blood chemistry: Secondary | ICD-10-CM

## 2017-04-14 LAB — CBC WITH DIFFERENTIAL/PLATELET
Basophils Absolute: 0.1 10*3/uL (ref 0–0.1)
Basophils Relative: 1 %
EOS ABS: 0.1 10*3/uL (ref 0–0.7)
Eosinophils Relative: 1 %
HEMATOCRIT: 30.4 % — AB (ref 40.0–52.0)
HEMOGLOBIN: 10.2 g/dL — AB (ref 13.0–18.0)
LYMPHS ABS: 1.2 10*3/uL (ref 1.0–3.6)
Lymphocytes Relative: 19 %
MCH: 26.2 pg (ref 26.0–34.0)
MCHC: 33.4 g/dL (ref 32.0–36.0)
MCV: 78.3 fL — ABNORMAL LOW (ref 80.0–100.0)
MONO ABS: 0.6 10*3/uL (ref 0.2–1.0)
MONOS PCT: 10 %
NEUTROS ABS: 4 10*3/uL (ref 1.4–6.5)
NEUTROS PCT: 69 %
Platelets: 320 10*3/uL (ref 150–440)
RBC: 3.89 MIL/uL — ABNORMAL LOW (ref 4.40–5.90)
RDW: 15.6 % — AB (ref 11.5–14.5)
WBC: 5.9 10*3/uL (ref 3.8–10.6)

## 2017-04-14 LAB — URINALYSIS, COMPLETE (UACMP) WITH MICROSCOPIC
Bacteria, UA: NONE SEEN
Bilirubin Urine: NEGATIVE
Ketones, ur: NEGATIVE mg/dL
Leukocytes, UA: NEGATIVE
NITRITE: NEGATIVE
PH: 7 (ref 5.0–8.0)
SPECIFIC GRAVITY, URINE: 1.014 (ref 1.005–1.030)
Squamous Epithelial / LPF: NONE SEEN

## 2017-04-14 LAB — COMPREHENSIVE METABOLIC PANEL
ALK PHOS: 142 U/L — AB (ref 38–126)
ALT: 31 U/L (ref 17–63)
ANION GAP: 5 (ref 5–15)
AST: 46 U/L — ABNORMAL HIGH (ref 15–41)
Albumin: 1.3 g/dL — ABNORMAL LOW (ref 3.5–5.0)
BILIRUBIN TOTAL: 0.4 mg/dL (ref 0.3–1.2)
BUN: 24 mg/dL — ABNORMAL HIGH (ref 6–20)
CALCIUM: 7.9 mg/dL — AB (ref 8.9–10.3)
CO2: 28 mmol/L (ref 22–32)
Chloride: 101 mmol/L (ref 101–111)
Creatinine, Ser: 1.26 mg/dL — ABNORMAL HIGH (ref 0.61–1.24)
GFR calc non Af Amer: >60 mL/min (ref >60)
Glucose, Bld: 349 mg/dL — ABNORMAL HIGH (ref 65–99)
Potassium: 3.1 mmol/L — ABNORMAL LOW (ref 3.5–5.1)
Sodium: 134 mmol/L — ABNORMAL LOW (ref 135–145)
TOTAL PROTEIN: 5.4 g/dL — AB (ref 6.5–8.1)

## 2017-04-14 LAB — HEMOGLOBIN A1C
Hgb A1c MFr Bld: 14.1 % — ABNORMAL HIGH (ref 4.8–5.6)
Mean Plasma Glucose: 357.97 mg/dL

## 2017-04-14 LAB — TROPONIN I
TROPONIN I: 0.04 ng/mL — AB (ref <0.03)
Troponin I: 0.04 ng/mL (ref <0.03)

## 2017-04-14 LAB — GLUCOSE, CAPILLARY
Glucose-Capillary: 351 mg/dL — ABNORMAL HIGH (ref 65–99)
Glucose-Capillary: 277 mg/dL — ABNORMAL HIGH (ref 65–99)
Glucose-Capillary: 318 mg/dL — ABNORMAL HIGH (ref 65–99)
Glucose-Capillary: 245 mg/dL — ABNORMAL HIGH (ref 65–99)

## 2017-04-14 LAB — MAGNESIUM: MAGNESIUM: 1.9 mg/dL (ref 1.7–2.4)

## 2017-04-14 MED ORDER — HYDROCODONE-ACETAMINOPHEN 5-325 MG PO TABS: 1.0000 | ORAL_TABLET | ORAL | Status: DC | PRN

## 2017-04-14 MED ORDER — LISINOPRIL 10 MG PO TABS
10.0000 mg | ORAL_TABLET | Freq: Every day | ORAL | Status: DC
  Administered 2017-04-14 – 2017-04-18 (×5): 10 mg via ORAL
  Filled 2017-04-14 (×5): qty 1

## 2017-04-14 MED ORDER — INSULIN ASPART 100 UNIT/ML ~~LOC~~ SOLN
0.0000 [IU] | Freq: Three times a day (TID) | SUBCUTANEOUS | Status: DC
  Administered 2017-04-14: 18:00:00 7 [IU] via SUBCUTANEOUS
  Administered 2017-04-15 (×2): 3 [IU] via SUBCUTANEOUS
  Administered 2017-04-16: 1 [IU] via SUBCUTANEOUS
  Administered 2017-04-16: 2 [IU] via SUBCUTANEOUS
  Administered 2017-04-17: 3 [IU] via SUBCUTANEOUS
  Administered 2017-04-17: 17:00:00 7 [IU] via SUBCUTANEOUS
  Administered 2017-04-18: 12:00:00 2 [IU] via SUBCUTANEOUS
  Administered 2017-04-18: 08:00:00 1 [IU] via SUBCUTANEOUS
  Filled 2017-04-14 (×9): qty 1

## 2017-04-14 MED ORDER — INSULIN ASPART 100 UNIT/ML ~~LOC~~ SOLN
0.0000 [IU] | Freq: Every day | SUBCUTANEOUS | Status: DC
  Administered 2017-04-14 – 2017-04-17 (×2): 2 [IU] via SUBCUTANEOUS
  Filled 2017-04-14 (×2): qty 1

## 2017-04-14 MED ORDER — ONDANSETRON HCL 4 MG/2ML IJ SOLN: 4.0000 mg | Freq: Four times a day (QID) | INTRAMUSCULAR | Status: DC | PRN

## 2017-04-14 MED ORDER — INSULIN GLARGINE 100 UNIT/ML ~~LOC~~ SOLN
30.0000 [IU] | Freq: Every day | SUBCUTANEOUS | Status: DC
  Administered 2017-04-14 – 2017-04-16 (×3): 30 [IU] via SUBCUTANEOUS
  Filled 2017-04-14 (×4): qty 0.3

## 2017-04-14 MED ORDER — SENNOSIDES-DOCUSATE SODIUM 8.6-50 MG PO TABS: 1.0000 | ORAL_TABLET | Freq: Every evening | ORAL | Status: DC | PRN

## 2017-04-14 MED ORDER — INSULIN ASPART 100 UNIT/ML ~~LOC~~ SOLN
10.0000 [IU] | Freq: Three times a day (TID) | SUBCUTANEOUS | Status: DC
  Administered 2017-04-14: 10 [IU] via SUBCUTANEOUS
  Filled 2017-04-14: qty 1

## 2017-04-14 MED ORDER — ENOXAPARIN SODIUM 40 MG/0.4ML ~~LOC~~ SOLN
40.0000 mg | SUBCUTANEOUS | Status: DC
  Administered 2017-04-14 – 2017-04-17 (×4): 40 mg via SUBCUTANEOUS
  Filled 2017-04-14 (×4): qty 0.4

## 2017-04-14 MED ORDER — ACETAMINOPHEN 325 MG PO TABS
650.0000 mg | ORAL_TABLET | Freq: Four times a day (QID) | ORAL | Status: DC | PRN
  Administered 2017-04-14 – 2017-04-18 (×4): 650 mg via ORAL
  Filled 2017-04-14 (×4): qty 2

## 2017-04-14 MED ORDER — POTASSIUM CHLORIDE CRYS ER 20 MEQ PO TBCR
40.0000 meq | EXTENDED_RELEASE_TABLET | Freq: Once | ORAL | Status: AC
  Administered 2017-04-14: 40 meq via ORAL
  Filled 2017-04-14: qty 2

## 2017-04-14 MED ORDER — INFLUENZA VAC SPLIT QUAD 0.5 ML IM SUSY
0.5000 mL | PREFILLED_SYRINGE | INTRAMUSCULAR | Status: DC
  Filled 2017-04-14: qty 0.5

## 2017-04-14 MED ORDER — ATORVASTATIN CALCIUM 20 MG PO TABS
40.0000 mg | ORAL_TABLET | Freq: Every day | ORAL | Status: DC
  Administered 2017-04-14 – 2017-04-17 (×4): 40 mg via ORAL
  Filled 2017-04-14 (×4): qty 2

## 2017-04-14 MED ORDER — BISACODYL 5 MG PO TBEC: 5.0000 mg | DELAYED_RELEASE_TABLET | Freq: Every day | ORAL | Status: DC | PRN

## 2017-04-14 MED ORDER — ACETAMINOPHEN 650 MG RE SUPP: 650.0000 mg | Freq: Four times a day (QID) | RECTAL | Status: DC | PRN

## 2017-04-14 MED ORDER — HYDRALAZINE HCL 20 MG/ML IJ SOLN
10.0000 mg | Freq: Four times a day (QID) | INTRAMUSCULAR | Status: DC | PRN
Start: 2017-04-14 — End: 2017-04-18
  Administered 2017-04-17 – 2017-04-18 (×2): 10 mg via INTRAVENOUS
  Filled 2017-04-14 (×2): qty 1

## 2017-04-14 MED ORDER — ASPIRIN EC 325 MG PO TBEC
325.0000 mg | DELAYED_RELEASE_TABLET | Freq: Every day | ORAL | Status: DC
  Administered 2017-04-14 – 2017-04-18 (×5): 325 mg via ORAL
  Filled 2017-04-14 (×5): qty 1

## 2017-04-14 MED ORDER — FUROSEMIDE 10 MG/ML IJ SOLN
40.0000 mg | Freq: Two times a day (BID) | INTRAMUSCULAR | Status: DC
  Administered 2017-04-14 – 2017-04-15 (×2): 40 mg via INTRAVENOUS
  Filled 2017-04-14 (×2): qty 4

## 2017-04-14 MED ORDER — ONDANSETRON HCL 4 MG PO TABS: 4.0000 mg | ORAL_TABLET | Freq: Four times a day (QID) | ORAL | Status: DC | PRN

## 2017-04-14 MED ORDER — ALBUTEROL SULFATE (2.5 MG/3ML) 0.083% IN NEBU: 2.5000 mg | INHALATION_SOLUTION | RESPIRATORY_TRACT | Status: DC | PRN

## 2017-04-14 NOTE — ED Notes (Signed)
Admitting Provider at bedside. 

## 2017-04-14 NOTE — ED Provider Notes (Addendum)
[facility] Emergency Department Provider Note       Time seen: ----------------------------------------- 10:15 AM on 04/14/2017 -----------------------------------------     I have reviewed the triage vital signs and the nursing notes.   HISTORY   Chief Complaint Groin Swelling    HPI [name] is a 31 y.o. male with a history of chronic in sometimes poorly controlled diabetes who presents to the ED for diffuse swelling. Patient describes genital and groin swelling. He was seen here 3 weeks ago for similar, was given 7 days of diuretics was told to follow-up with his primary care doctor. Patient states his scrotum has never swollen like it is today. He is also complaining of diffuse back pain. Patient reports she is making urine as normal. He denies fevers, chills, chest pain, shortness of breath, vomiting or diarrhea.  Past Medical History:  Diagnosis Date  . Abscess of groin, right 09/09/2016  . 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 Acute gastroenteritis 09/11/2016  . AKI (acute kidney injury) (HCC) 02/19/2013  . Anemia 12/14/2015  . Dehydration 09/11/2016  . Diabetes mellitus without complication (HCC)   . Diabetic acidosis without coma (HCC) 06/01/2016  . Elevated bilirubin 09/11/2016  . Essential hypertension with goal blood pressure less than 130/80 02/07/2015  . Hypercholesterolemia 02/25/2013  . Hyponatremia 09/11/2016  . Hypophosphatemia 12/14/2015  . Hypovitaminosis D 12/16/2015  . Mass of right inguinal region   . Microcytic anemia 11/17/2014  . Non-ST elevation myocardial infarction (NSTEMI) (HCC) 02/19/2013  . Poorly controlled type 1 diabetes mellitus (HCC) 03/03/2012   Overview:  Diagnosed 02/2012.  Now insulin-dependent  . Proteinuria 12/14/2015    Patient Active Problem List   Diagnosis Date Noted  . Dehydration 09/11/2016  . Elevated bilirubin 09/11/2016  . Acute gastroenteritis 09/11/2016  . Abscess of  groin, right 09/09/2016  . Diabetic acidosis without coma (HCC) 06/01/2016  . Hypovitaminosis D 12/16/2015  . Anemia 12/14/2015  . Hypophosphatemia 12/14/2015  . 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 Essential hypertension with goal blood pressure less than 130/80 02/07/2015  . Microcytic anemia 11/17/2014  . Hypercholesterolemia 02/25/2013  . AKI (acute kidney injury) (HCC) 02/19/2013  . Non-ST elevation myocardial infarction (NSTEMI) (HCC) 02/19/2013  . Poorly controlled type 1 diabetes mellitus (HCC) 03/03/2012    Past Surgical History:  Procedure Laterality Date  . LEG AMPUTATION ABOVE KNEE Right     Allergies Patient has no known allergies.  Social History Social History  Substance Use Topics  . Smoking status: Never Smoker  . Smokeless tobacco: Never Used  . Alcohol use No    Review of Systems Constitutional: Negative for fever. Cardiovascular: Negative for chest pain. Respiratory: Negative for shortness of breath. Gastrointestinal: Negative for abdominal pain, vomiting and diarrhea. Genitourinary: positive for scrotal swelling, negative for changes in urination Musculoskeletal: positive for severe edema, back pain Skin: Negative for rash. Neurological: Negative for headaches, focal weakness or numbness.  All systems negative/normal/unremarkable except as stated in the HPI  ____________________________________________   PHYSICAL EXAM:  VITAL SIGNS: ED Triage Vitals [04/14/17 0938]  Enc Vitals Group     BP (!) 167/84     Pulse Rate (!) 105     Resp 20     Temp 98 F (36.7 C)     Temp Source Oral     SpO2 100 %     Weight      Height  Head Circumference      Peak Flow      Pain Score      Pain Loc      Pain Edu?      Excl. in GC?     Constitutional: Alert and oriented. no acute distress Eyes: Conjunctivae are normal. Normal extraocular movements. ENT   Head: Normocephalic and atraumatic.   Nose: No  congestion/rhinnorhea.   Mouth/Throat: Mucous membranes are moist.   Neck: No stridor. Cardiovascular: Normal rate, regular rhythm. No murmurs, rubs, or gallops. Respiratory: Normal respiratory effort without tachypnea nor retractions. Breath sounds are clear and equal bilaterally. No wheezes/rales/rhonchi. Gastrointestinal: Soft and nontender. Normal bowel sounds Genitourinary: massive swelling of the scrotum which is diffusely edematous Musculoskeletal: Nontender with normal range of motion in extremities. previous right AKA, pitting edema is noted to the chest wall bilaterally. Neurologic:  Normal speech and language. No gross focal neurologic deficits are appreciated.  Skin:  Skin is warm, dry and intact. No rash noted. Psychiatric: Mood and affect are normal. Speech and behavior are normal.  ____________________________________________  EKG: Interpreted by me.sinus tachycardia with a rate of 104 bpm, normal PR interval, normal QRS size, normal QT, nonspecific repolarization abnormality  ____________________________________________  ED COURSE:  Pertinent labs & imaging results that were available during my care of the patient were reviewed by me and considered in my medical decision making (see chart for details). Patient presents for anasarca, we will assess with labs and imaging as indicated. patient likely and renal failure or worsening nephrotic syndrome. We'll assess with labs and likely discussed with nephrology.   Procedures ____________________________________________   LABS (pertinent positives/negatives)  Labs Reviewed  CBC WITH DIFFERENTIAL/PLATELET - Abnormal; Notable for the following:       Result Value   RBC 3.89 (*)    Hemoglobin 10.2 (*)    HCT 30.4 (*)    MCV 78.3 (*)    RDW 15.6 (*)    All other components within normal limits  COMPREHENSIVE METABOLIC PANEL - Abnormal; Notable for the following:    Sodium 134 (*)    Potassium 3.1 (*)    Glucose, Bld  349 (*)    BUN 24 (*)    Creatinine, Ser 1.26 (*)    Calcium 7.9 (*)    Total Protein 5.4 (*)    Albumin 1.3 (*)    AST 46 (*)    Alkaline Phosphatase 142 (*)    All other components within normal limits  TROPONIN I - Abnormal; Notable for the following:    Troponin I 0.04 (*)    All other components within normal limits  URINALYSIS, COMPLETE (UACMP) WITH MICROSCOPIC - Abnormal; Notable for the following:    Color, Urine STRAW (*)    APPearance CLEAR (*)    Glucose, UA >=500 (*)    Hgb urine dipstick SMALL (*)    Protein, ur >=300 (*)    All other components within normal limits  GLUCOSE, CAPILLARY - Abnormal; Notable for the following:    Glucose-Capillary 351 (*)    All other components within normal limits  CBG MONITORING, ED   CRITICAL CARE Performed by: Emily Filbert   Total critical care time: 30 minutes  Critical care time was exclusive of separately billable procedures and treating other patients.  Critical care was necessary to treat or prevent imminent or life-threatening deterioration.  Critical care was time spent personally by me on the following activities: development of treatment plan with patient and/or surrogate as  well as nursing, discussions with consultants, evaluation of patient's response to treatment, examination of patient, obtaining history from patient or surrogate, ordering and performing treatments and interventions, ordering and review of laboratory studies, ordering and review of radiographic studies, pulse oximetry and re-evaluation of patient's condition.  RADIOLOGY  renal ultrasound IMPRESSION: 1. No hydronephrosis. 2. Possible mild increased renal cortical echogenicity which can be seen with medical renal disease. ____________________________________________  DIFFERENTIAL DIAGNOSIS   renal failure, nephrotic syndrome, anasarca, hyperglycemia, medication noncompliance, electrolyte abnormality   FINAL ASSESSMENT AND  PLAN  Anasarca, Hyperglycemia   Plan: Patient had presented for severe swelling and diffuse edema. Patients labs reveal approximately unchanged creatinine and electrolytes compared previous visit. Troponin is mildly elevated and he is persistently hyperglycemic. Patients imaging revealed some possible medical renal disease changes in his kidneys. I will discussed with nephrology but I think he needs to be admitted to see if we can improve his edema and to check for nephrotic syndrome.   Emily Filbert, MD   Note: This note was generated in part or whole with voice recognition software. Voice recognition is usually quite accurate but there are transcription errors that can and very often do occur. I apologize for any typographical errors that were not detected and corrected.     Emily Filbert, MD 04/14/17 1217    Emily Filbert, MD 04/14/17 1330

## 2017-04-14 NOTE — ED Notes (Signed)
Patient transported to Ultrasound 

## 2017-04-14 NOTE — ED Notes (Signed)
Left leg/ groin swelling, seen here for same x2 weeks ago and had followed up with PCP

## 2017-04-14 NOTE — ED Triage Notes (Signed)
Pt returns for genital and groin swelling, was seen here on September 17 for same, pt was given seven days of fluid pills and was told to follow up with PCP.

## 2017-04-14 NOTE — ED Notes (Signed)
IV team RN at bedside.  

## 2017-04-14 NOTE — H&P (Addendum)
[facility] [place] [facility]   PATIENT NAME: [name]    MR#:  [id number]  DATE OF BIRTH:  12/19/1985  DATE OF ADMISSION:  04/14/2017  PRIMARY CARE PHYSICIAN: Oswaldo Conroy, MD   REQUESTING/REFERRING PHYSICIAN: Emily Filbert, MD  CHIEF COMPLAINT:   Chief Complaint  Patient presents with  . Groin Swelling   Worsening SWELLING for 3 weeks. HISTORY OF PRESENT ILLNESS:  [name]  is a 31 y.o. male with a known history of Hypertension, diabetes type 1, acute renal failure, DKA, non-STEMI and anemia. The patient presents in the ED with the above chief complaints. He started to have leg edema and then increased to abdominal wall and the scrotal edema. He was started Lasix one week ago but the diffuse body swelling has been worsening, he also feels facial swelling for the past few days. He also complains of weight gain and shortness of breath on exertion. Dr. Cherylann Ratel suspect this patient has nephrotic syndrome and suggested admitting the patient.  PAST MEDICAL HISTORY:   Past Medical History:  Diagnosis Date  . Abscess of groin, right 09/09/2016  . 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 Acute gastroenteritis 09/11/2016  . AKI (acute kidney injury) (HCC) 02/19/2013  . Anemia 12/14/2015  . Dehydration 09/11/2016  . Diabetes mellitus without complication (HCC)   . Diabetic acidosis without coma (HCC) 06/01/2016  . Elevated bilirubin 09/11/2016  . Essential hypertension with goal blood pressure less than 130/80 02/07/2015  . Hypercholesterolemia 02/25/2013  . Hyponatremia 09/11/2016  . Hypophosphatemia 12/14/2015  . Hypovitaminosis D 12/16/2015  . Mass of right inguinal region   . Microcytic anemia 11/17/2014  . Non-ST elevation myocardial infarction (NSTEMI) (HCC) 02/19/2013  . Poorly controlled type 1 diabetes mellitus (HCC) 03/03/2012   Overview:  Diagnosed 02/2012.  Now insulin-dependent  . Proteinuria 12/14/2015     PAST SURGICAL HISTORY:   Past Surgical History:  Procedure Laterality Date  . LEG AMPUTATION ABOVE KNEE Right     SOCIAL HISTORY:   Social History  Substance Use Topics  . Smoking status: Never Smoker  . Smokeless tobacco: Never Used  . Alcohol use No    FAMILY HISTORY:   Family History  Problem Relation Age of Onset  . Diabetes Mother     DRUG ALLERGIES:  No Known Allergies  REVIEW OF SYSTEMS:   Review of Systems  Constitutional: Negative for chills, fever and malaise/fatigue.       Weight gain and anasarca  HENT: Negative for sore throat.   Eyes: Negative for blurred vision and double vision.  Respiratory: Negative for cough, hemoptysis, shortness of breath, wheezing and stridor.   Cardiovascular: Positive for leg swelling. Negative for chest pain, palpitations and orthopnea.  Gastrointestinal: Negative for abdominal pain, blood in stool, diarrhea, melena, nausea and vomiting.  Genitourinary: Negative for dysuria, flank pain and hematuria.  Musculoskeletal: Negative for back pain and joint pain.  Skin: Negative for rash.  Neurological: Negative for dizziness, sensory change, focal weakness, seizures, loss of consciousness, weakness and headaches.  Endo/Heme/Allergies: Negative for polydipsia.  Psychiatric/Behavioral: Negative for depression. The patient is not nervous/anxious.     MEDICATIONS AT HOME:   Prior to Admission medications   Medication Sig Start Date End Date Taking? Authorizing Provider  furosemide (LASIX) 40 MG tablet Take 1 tablet (40 mg total) by mouth daily. 03/24/17 03/24/18 Yes Phineas Semen, MD  insulin aspart (NOVOLOG) 100 UNIT/ML injection Inject 20 Units into  the skin 3 (three) times daily before meals. Patient taking differently: Inject 10 Units into the skin 3 (three) times daily before meals.  06/02/16  Yes Auburn Bilberry, MD  Insulin Detemir (LEVEMIR FLEXPEN [place]) Inject 30 Units into the skin at bedtime.   Yes [provider]  amoxicillin-clavulanate (AUGMENTIN) 875-125 MG tablet Take 1 tablet by mouth 2 (two) times daily. Patient not taking: Reported on 04/14/2017 09/11/16   Katharina Caper, MD  insulin glargine (LANTUS) 100 UNIT/ML injection Inject 0.1 mLs (10 Units total) into the skin 2 (two) times daily. Patient not taking: Reported on 04/14/2017 09/11/16   Katharina Caper, MD      VITAL SIGNS:  Blood pressure (!) 161/81, pulse 97, temperature 98 F (36.7 C), temperature source Oral, resp. rate 20, SpO2 99 %.  PHYSICAL EXAMINATION:  Physical Exam  GENERAL:  31 y.o.-year-old patient lying in the bed with no acute distress.  EYES: Pupils equal, round, reactive to light and accommodation. No scleral icterus. Extraocular muscles intact. Facial edema. HEENT: Head atraumatic, normocephalic. Oropharynx and nasopharynx clear.  NECK:  Supple, no jugular venous distention. No thyroid enlargement, no tenderness.  LUNGS: Normal breath sounds bilaterally, no wheezing, rales,rhonchi or crepitation. No use of accessory muscles of respiration.  CARDIOVASCULAR: S1, S2 normal. No murmurs, rubs, or gallops.  ABDOMEN: Soft, nontender, nondistended. Bowel sounds present. No organomegaly or mass. Abdominal wall and scrotal edema.  EXTREMITIES: No cyanosis, or clubbing. Left leg edema, right AKA. NEUROLOGIC: Cranial nerves II through XII are intact. Muscle strength 5/5 in all extremities. Sensation intact. Gait not checked.  PSYCHIATRIC: The patient is alert and oriented x 3.  SKIN: No obvious rash, lesion, or ulcer.   LABORATORY PANEL:   CBC  Recent Labs Lab 04/14/17 1123  WBC 5.9  HGB 10.2*  HCT 30.4*  PLT 320   ------------------------------------------------------------------------------------------------------------------  Chemistries   Recent Labs Lab 04/14/17 1123  NA 134*  K 3.1*  CL 101  CO2 28  GLUCOSE 349*  BUN 24*  CREATININE 1.26*  CALCIUM 7.9*  AST 46*  ALT 31  ALKPHOS 142*  BILITOT 0.4    ------------------------------------------------------------------------------------------------------------------  Cardiac Enzymes  Recent Labs Lab 04/14/17 1123  TROPONINI 0.04*   ------------------------------------------------------------------------------------------------------------------  RADIOLOGY:  US Renal  Result Date: 04/14/2017 CLINICAL DATA:  Anasarca. EXAM: RENAL / URINARY TRACT ULTRASOUND COMPLETE COMPARISON:  None FINDINGS: Right Kidney: Length: 12.2 cm. No hydronephrosis. Possible mild increased renal echogenicity. Left Kidney: Length: 12.0 cm. No hydronephrosis. Possible mild increased renal echogenicity. Bladder: Apparent mild bladder wall thickening is favored to be due to underdistention. IMPRESSION: 1.  No hydronephrosis. 2. Possible mild increased renal cortical echogenicity which can be seen with medical renal disease. Electronically Signed   By: Jeronimo Greaves M.D.   On: 04/14/2017 10:48      IMPRESSION AND PLAN:   Anasarca, possible nephrotic syndrome. The patient will be admitted to medical floor. Start Lasix 40 mg IV every 12 hours, follow-up BMP and nephrology consult.  Hypoalbuminemia. Follow-up nephrology consult.  Hypokalemia. Give potassium supplement, follow-up K level and magnesium level.  Elevated troponin. Unclear ideology, follow-up troponin level and lipid panel. Start aspirin and Lipitor.  Hypertension. Continue Lasix and lisinopril, IV hydralazine when necessary. Diabetes. Continue Levemir, NovoLog before meals and sliding scale. Anemia of chronic disease. Stable.   All the records are reviewed and case discussed with ED provider. Management plans discussed with the patient, family and they are in agreement.  CODE STATUS: Full code  TOTAL TIME  TAKING CARE OF THIS PATIENT: 57 minutes.    Shaune Pollack M.D on 04/14/2017 at 1:58 PM  Between 7am to 6pm - Pager - [phone]  After 6pm go to [email or web address] - Air traffic controller  [facility] [place] [facility]  Office  [phone]  CC: Primary care physician; Oswaldo Conroy, MD   Note: This dictation was prepared with Dragon dictation along with smaller phrase technology. Any transcriptional errors that result from this process are unin

## 2017-04-14 NOTE — ED Notes (Signed)
Date and time results received: 04/14/17 1200  (use smartphrase ".now" to insert current time)  Test: troponin Critical Value: 0.04  Name of Provider Notified: Dr. Mayford Knife  Orders Received? Or Actions Taken?:

## 2017-04-14 NOTE — ED Notes (Signed)
Pt reports he does not have a legal guardian. Pt states he makes his own medical decisions.

## 2017-04-15 ENCOUNTER — Inpatient Hospital Stay: Payer: Medicaid Other

## 2017-04-15 LAB — CBC
HCT: 27.6 % — ABNORMAL LOW (ref 40.0–52.0)
Hemoglobin: 9.3 g/dL — ABNORMAL LOW (ref 13.0–18.0)
MCH: 26.1 pg (ref 26.0–34.0)
MCHC: 33.6 g/dL (ref 32.0–36.0)
MCV: 77.5 fL — ABNORMAL LOW (ref 80.0–100.0)
PLATELETS: 314 10*3/uL (ref 150–440)
RBC: 3.56 MIL/uL — AB (ref 4.40–5.90)
RDW: 15.9 % — ABNORMAL HIGH (ref 11.5–14.5)
WBC: 5.6 10*3/uL (ref 3.8–10.6)

## 2017-04-15 LAB — BASIC METABOLIC PANEL
ANION GAP: 7 (ref 5–15)
BUN: 21 mg/dL — ABNORMAL HIGH (ref 6–20)
CHLORIDE: 102 mmol/L (ref 101–111)
CO2: 28 mmol/L (ref 22–32)
Calcium: 7.8 mg/dL — ABNORMAL LOW (ref 8.9–10.3)
Creatinine, Ser: 1.27 mg/dL — ABNORMAL HIGH (ref 0.61–1.24)
Glucose, Bld: 109 mg/dL — ABNORMAL HIGH (ref 65–99)
POTASSIUM: 3 mmol/L — AB (ref 3.5–5.1)
SODIUM: 137 mmol/L (ref 135–145)

## 2017-04-15 LAB — PROTEIN / CREATININE RATIO, URINE
CREATININE, URINE: 75 mg/dL
PROTEIN CREATININE RATIO: 9.27 mg/mg{creat} — AB (ref 0.00–0.15)
TOTAL PROTEIN, URINE: 695 mg/dL

## 2017-04-15 LAB — LIPID PANEL
CHOL/HDL RATIO: 9.2 ratio
CHOLESTEROL: 432 mg/dL — AB (ref 0–200)
HDL: 47 mg/dL (ref >40)
LDL Cholesterol: 347 mg/dL — ABNORMAL HIGH (ref 0–99)
TRIGLYCERIDES: 188 mg/dL — AB (ref <150)
VLDL: 38 mg/dL (ref 0–40)

## 2017-04-15 LAB — GLUCOSE, CAPILLARY
GLUCOSE-CAPILLARY: 60 mg/dL — AB (ref 65–99)
GLUCOSE-CAPILLARY: 98 mg/dL (ref 65–99)
GLUCOSE-CAPILLARY: 161 mg/dL — AB (ref 65–99)
Glucose-Capillary: 204 mg/dL — ABNORMAL HIGH (ref 65–99)
Glucose-Capillary: 211 mg/dL — ABNORMAL HIGH (ref 65–99)

## 2017-04-15 MED ORDER — MUPIROCIN CALCIUM 2 % EX CREA
TOPICAL_CREAM | Freq: Two times a day (BID) | CUTANEOUS | Status: DC
  Administered 2017-04-15 – 2017-04-18 (×7): via TOPICAL
  Filled 2017-04-15: qty 15

## 2017-04-15 MED ORDER — POTASSIUM CHLORIDE 10 MEQ/100ML IV SOLN: 10.0000 meq | INTRAVENOUS | Status: DC

## 2017-04-15 MED ORDER — POTASSIUM CHLORIDE CRYS ER 20 MEQ PO TBCR
40.0000 meq | EXTENDED_RELEASE_TABLET | ORAL | Status: AC
  Administered 2017-04-15 (×2): 40 meq via ORAL
  Filled 2017-04-15 (×2): qty 2

## 2017-04-15 MED ORDER — INSULIN ASPART 100 UNIT/ML ~~LOC~~ SOLN
6.0000 [IU] | Freq: Three times a day (TID) | SUBCUTANEOUS | Status: DC
  Administered 2017-04-15 – 2017-04-16 (×5): 6 [IU] via SUBCUTANEOUS
  Filled 2017-04-15 (×5): qty 1

## 2017-04-15 MED ORDER — ALBUMIN HUMAN 25 % IV SOLN
25.0000 g | Freq: Three times a day (TID) | INTRAVENOUS | Status: DC
  Administered 2017-04-15 – 2017-04-18 (×8): 25 g via INTRAVENOUS
  Filled 2017-04-15 (×11): qty 100

## 2017-04-15 MED ORDER — DEXTROSE 5 % IV SOLN
6.0000 mg/h | INTRAVENOUS | Status: DC
  Administered 2017-04-15: 6 mg/h via INTRAVENOUS
  Administered 2017-04-17: 03:00:00 8 mg/h via INTRAVENOUS
  Filled 2017-04-15 (×2): qty 25

## 2017-04-15 NOTE — Progress Notes (Signed)
Spoke with patient about his current A1c of 14.1%.  Explained what an A1c is and what it measures.  Reminded patient that his goal A1c is 7% or less per ADA standards to prevent both acute and long-term complications.  Explained to patient the extreme importance of good glucose control at home.  Encouraged patient to check his CBGs at least TID AC at home and to record all CBGs in a logbook for his PCP to review.  Patient told me he sees the physicians at the [facility].  Gets his insulins there for an affordable price.  Checks CBGs sporadically at home.  Told me he has widely fluctuating CBGs sometimes when he does check CBGs.  Patient also told me he does not eat a lot at home and when he does not eat he does not take any Novolog.  Does not have a Correction Novolog scale to follow at home- Only knows to take 10 units Novolog with meals so he holds this when he does not eat.  Does take Levemir on a regular basis.  Encouraged pt to always take Levemir (even when NPO) as pt does not make insulin and could quickly develop DKA if he skips his Levemir.  Patient stated understanding.    Asked patient is he would be willing to use Novolog on a sliding scale basis at home to correct his elevated CBGs even when he is not eating.  Explained to pt that Novolog can be given both to cover food intake and to correct high CBGs.  Explained how a sliding scale would work in addition to his set dose of meal coverage.  Patient stated he would be willing to use a Sliding scale as well on the days when he does not eat.      MD- At time of discharge, please give patient a Novolog Sliding scale to follow (can copy the hospital Sensitive scale 0-9 units) TID before meals if CBGs are elevated.  He can use this in addition to his Novolog meal coverage and can also use on days when he does not eat well.     --Will follow patient during hospitalization--  Ambrose Finland RN, MSN, CDE Diabetes  Coordinator Inpatient Glycemic Control Team Team Pager: [phone] (8a-5p)

## 2017-04-15 NOTE — Progress Notes (Signed)
Spoke with diabetes educator about blood sugars see will come to see patient

## 2017-04-15 NOTE — Progress Notes (Signed)
Inpatient Diabetes Program Recommendations  AACE/ADA: New Consensus Statement on Inpatient Glycemic Control (2015)  Target Ranges:  Prepandial:   less than 140 mg/dL      Peak postprandial:   less than 180 mg/dL (1-2 hours)      Critically ill patients:  140 - 180 mg/dL   Results for WOODWARD, KLEM (MRN [id number]) as of 04/15/2017 08:41  Ref. Range 04/14/2017 10:17 04/14/2017 14:42 04/14/2017 17:08 04/14/2017 20:55  Glucose-Capillary Latest Ref Range: 65 - 99 mg/dL 409 (H) 811 (H) 914 (H) 245 (H)   Results for STONEWALL, DOSS (MRN [id number]) as of 04/15/2017 08:41  Ref. Range 04/15/2017 07:59  Glucose-Capillary Latest Ref Range: 65 - 99 mg/dL 60 (L)   Results for JESSON, FOSKEY (MRN [id number]) as of 04/15/2017 08:41  Ref. Range 04/14/2017 11:23  Hemoglobin A1C Latest Ref Range: 4.8 - 5.6 % 14.1 (H)    Home DM Meds: Levemir 30 units QHS       Novolog 10 units TID with meals  Current Insulin Orders: Lantus 30 units QHS      Novolog Sensitive Correction Scale/ SSI (0-9 units) TID AC + HS      Novolog 10 units TID with meals     Plan to meet with patient today to discuss home diabetes regimen.   MD- Patient with Hypoglycemia this AM (CBG down to 60 mg/dl) after receiving 30 units Lantus last PM.  Patient takes Levemir at home.  Please consider the following in-hospital insulin adjustments:  1. Change and Reduce basal insulin to Levemir 25 units QHS  2. Reduce Novolog Meal Coverage to: Novolog 6 units TID with meals (hold if pt eats <50% of meal)      --Will follow patient during hospitalization--  Ambrose Finland RN, MSN, CDE Diabetes Coordinator Inpatient Glycemic Control Team Team Pager: [phone] (8a-5p)

## 2017-04-15 NOTE — Consult Note (Signed)
[facility] CONSULT NOTE    Date: 04/15/2017                  Patient Name:  [name]  MRN: [id number]  DOB: July 18, 1985  Age / Sex: 31 y.o., male         PCP: Oswaldo Conroy, MD                 Service Requesting Consult: Hospitalist                 Reason for Consult: Anasarca, proteinuria            History of Present Illness: Patient is a 31 y.o. male with a PMHx of Diabetes mellitus type 1, hypertension, prior episodes of DKA, history of non-ST elevation myocardial infarction, peripheral vascular disease with right above-the-knee amputation, hyperlipidemia who was admitted to [facility] on 04/14/2017 for evaluation of severe anasarca. Over the past several weeks the patient has noted that he's been having increasing swelling. He did visit the emergency department approximately 3 weeks ago. He was given diuretics temporarily. He came back with ongoing significant edema. He reports to me that he's had foamy urine for quite some time. Urine protein is greater than 300 mg/dL. Patient also has significant glucosuria.  Back in March his creatinine was 0.4. Now his creatinine is up to 1.27.   Medications: Outpatient medications: Prescriptions Prior to Admission  Medication Sig Dispense Refill Last Dose  . furosemide (LASIX) 40 MG tablet Take 1 tablet (40 mg total) by mouth daily. 7 tablet 0 04/13/2017 at 0800  . insulin aspart (NOVOLOG) 100 UNIT/ML injection Inject 20 Units into the skin 3 (three) times daily before meals. (Patient taking differently: Inject 10 Units into the skin 3 (three) times daily before meals. ) 10 mL 11 04/13/2017 at 0800  . Insulin Detemir (LEVEMIR FLEXPEN Adrian) Inject 30 Units into the skin at bedtime.   04/13/2017 at 2100  . amoxicillin-clavulanate (AUGMENTIN) 875-125 MG tablet Take 1 tablet by mouth 2 (two) times daily. (Patient not taking: Reported on 04/14/2017) 20 tablet 0 Not Taking at Unknown time  . insulin glargine (LANTUS) 100  UNIT/ML injection Inject 0.1 mLs (10 Units total) into the skin 2 (two) times daily. (Patient not taking: Reported on 04/14/2017) 10 mL 11 Not Taking at 0800    Current medications: Current Facility-Administered Medications  Medication Dose Route Frequency Provider Last Rate Last Dose  . acetaminophen (TYLENOL) tablet 650 mg  650 mg Oral Q6H PRN Shaune Pollack, MD   650 mg at 04/14/17 1433   Or  . acetaminophen (TYLENOL) suppository 650 mg  650 mg Rectal Q6H PRN Shaune Pollack, MD      . albuterol (PROVENTIL) (2.5 MG/3ML) 0.083% nebulizer solution 2.5 mg  2.5 mg Nebulization Q2H PRN Shaune Pollack, MD      . aspirin EC tablet 325 mg  325 mg Oral Daily Shaune Pollack, MD   325 mg at 04/15/17 0956  . atorvastatin (LIPITOR) tablet 40 mg  40 mg Oral q1800 Shaune Pollack, MD   40 mg at 04/14/17 1752  . bisacodyl (DULCOLAX) EC tablet 5 mg  5 mg Oral Daily PRN Shaune Pollack, MD      . enoxaparin (LOVENOX) injection 40 mg  40 mg Subcutaneous Q24H Shaune Pollack, MD   40 mg at 04/14/17 2124  . furosemide (LASIX) injection 40 mg  40 mg Intravenous Mila Palmer, MD   40 mg at 04/15/17 0142  .  hydrALAZINE (APRESOLINE) injection 10 mg  10 mg Intravenous Q6H PRN Shaune Pollack, MD      . HYDROcodone-acetaminophen (NORCO/VICODIN) 5-325 MG per tablet 1-2 tablet  1-2 tablet Oral Q4H PRN Shaune Pollack, MD      . Influenza vac split quadrivalent PF (FLUARIX) injection 0.5 mL  0.5 mL Intramuscular Tomorrow-1000 Shaune Pollack, MD      . insulin aspart (novoLOG) injection 0-5 Units  0-5 Units Subcutaneous QHS Shaune Pollack, MD   2 Units at 04/14/17 2124  . insulin aspart (novoLOG) injection 0-9 Units  0-9 Units Subcutaneous TID WC Shaune Pollack, MD   7 Units at 04/14/17 1752  . insulin aspart (novoLOG) injection 10 Units  10 Units Subcutaneous TID Karleen Hampshire, MD   10 Units at 04/14/17 1751  . insulin glargine (LANTUS) injection 30 Units  30 Units Subcutaneous QHS Shaune Pollack, MD   30 Units at 04/14/17 2124  . lisinopril (PRINIVIL,ZESTRIL) tablet 10 mg   10 mg Oral Daily Shaune Pollack, MD   10 mg at 04/15/17 0956  . ondansetron (ZOFRAN) tablet 4 mg  4 mg Oral Q6H PRN Shaune Pollack, MD       Or  . ondansetron [facility]) injection 4 mg  4 mg Intravenous Q6H PRN Shaune Pollack, MD      . senna-docusate (Senokot-S) tablet 1 tablet  1 tablet Oral QHS PRN Shaune Pollack, MD          Allergies: No Known Allergies    Past Medical History: Past Medical History:  Diagnosis Date  . Abscess of groin, right 09/09/2016  . 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 Acute gastroenteritis 09/11/2016  . AKI (acute kidney injury) (HCC) 02/19/2013  . Anemia 12/14/2015  . Dehydration 09/11/2016  . Diabetes mellitus without complication (HCC)   . Diabetic acidosis without coma (HCC) 06/01/2016  . Elevated bilirubin 09/11/2016  . Essential hypertension with goal blood pressure less than 130/80 02/07/2015  . Hypercholesterolemia 02/25/2013  . Hyponatremia 09/11/2016  . Hypophosphatemia 12/14/2015  . Hypovitaminosis D 12/16/2015  . Mass of right inguinal region   . Microcytic anemia 11/17/2014  . Non-ST elevation myocardial infarction (NSTEMI) (HCC) 02/19/2013  . Poorly controlled type 1 diabetes mellitus (HCC) 03/03/2012   Overview:  Diagnosed 02/2012.  Now insulin-dependent  . Proteinuria 12/14/2015     Past Surgical History: Past Surgical History:  Procedure Laterality Date  . LEG AMPUTATION ABOVE KNEE Right      Family History: Family History  Problem Relation Age of Onset  . Diabetes Mother      Social History: Social History   Social History  . Marital status: Single    Spouse name: N/A  . Number of children: N/A  . Years of education: N/A   Occupational History  . Not on file.   Social History Main Topics  . Smoking status: Never Smoker  . Smokeless tobacco: Never Used  . Alcohol use No  . Drug use: No  . Sexual activity: Not on file   Other Topics Concern  . Not on file   Social History Narrative  . No narrative on file      Review of Systems: As per HPI  Vital Signs: Blood pressure (!) 162/96, pulse 93, temperature 97.7 F (36.5 C), temperature source Oral, resp. rate 17, height  (1.702 m), weight 105 kg (231 lb 6.4 oz), SpO2 100 %.  Weight trends: Filed Weights   04/14/17 1422  Weight: 105 kg (  231 lb 6.4 oz)    Physical Exam: General: NAD, laying in bed  Head: Normocephalic, atraumatic.  Eyes: Periorbital edema noted  Nose: Mucous membranes moist, not inflammed, nonerythematous.  Throat: Oropharynx nonerythematous, no exudate appreciated.   Neck: Supple, trachea midline.  Lungs:  Normal respiratory effort. Clear to auscultation BL without crackles or wheezes.  Heart: RRR. S1 and S2 normal without gallop, murmur, or rubs.  Abdomen:  BS normoactive. Soft, Nondistended, non-tender.  No masses or organomegaly.  Extremities: 3+ LLE edema, R AKA noted  Neurologic: A&O X3, Motor strength is 5/5 in the all 4 extremities  Skin: Ulcer on LLE noted    Lab results: Basic Metabolic Panel:  Recent Labs Lab 04/14/17 1123 04/15/17 0254  NA 134* 137  K 3.1* 3.0*  CL 101 102  CO2 28 28  GLUCOSE 349* 109*  BUN 24* 21*  CREATININE 1.26* 1.27*  CALCIUM 7.9* 7.8*  MG 1.9  --     Liver Function Tests:  Recent Labs Lab 04/14/17 1123  AST 46*  ALT 31  ALKPHOS 142*  BILITOT 0.4  PROT 5.4*  ALBUMIN 1.3*   No results for input(s): LIPASE, AMYLASE in the last 168 hours. No results for input(s): AMMONIA in the last 168 hours.  CBC:  Recent Labs Lab 04/14/17 1123 04/15/17 0254  WBC 5.9 5.6  NEUTROABS 4.0  --   HGB 10.2* 9.3*  HCT 30.4* 27.6*  MCV 78.3* 77.5*  PLT 320 314    Cardiac Enzymes:  Recent Labs Lab 04/14/17 1123  TROPONINI 0.04*  0.04*    BNP: Invalid input(s): POCBNP  CBG:  Recent Labs Lab 04/14/17 1442 04/14/17 1708 04/14/17 2055 04/15/17 0759 04/15/17 0905  GLUCAP 277* 318* 245* 60* 98    Microbiology: Results for orders placed or performed  during the hospital encounter of 09/09/16  MRSA PCR Screening     Status: None   Collection Time: 09/09/16  7:50 PM  Result Value Ref Range Status   MRSA by PCR NEGATIVE NEGATIVE Final    Comment:        The GeneXpert MRSA Assay (FDA approved for NASAL specimens only), is one component of a comprehensive MRSA colonization surveillance program. It is not intended to diagnose MRSA infection nor to guide or monitor treatment for MRSA infections.     Coagulation Studies: No results for input(s): LABPROT, INR in the last 72 hours.  Urinalysis:  Recent Labs  04/14/17 1233  COLORURINE STRAW*  LABSPEC 1.014  PHURINE 7.0  GLUCOSEU >=500*  HGBUR SMALL*  BILIRUBINUR NEGATIVE  KETONESUR NEGATIVE  PROTEINUR >=300*  NITRITE NEGATIVE  LEUKOCYTESUR NEGATIVE      Imaging: US Renal  Result Date: 04/14/2017 CLINICAL DATA:  Anasarca. EXAM: RENAL / URINARY TRACT ULTRASOUND COMPLETE COMPARISON:  None FINDINGS: Right Kidney: Length: 12.2 cm. No hydronephrosis. Possible mild increased renal echogenicity. Left Kidney: Length: 12.0 cm. No hydronephrosis. Possible mild increased renal echogenicity. Bladder: Apparent mild bladder wall thickening is favored to be due to underdistention. IMPRESSION: 1.  No hydronephrosis. 2. Possible mild increased renal cortical echogenicity which can be seen with medical renal disease. Electronically Signed   By: Jeronimo Greaves M.D.   On: 04/14/2017 10:48      Assessment & Plan: Pt is a 31 y.o. male with a PMHx of Diabetes mellitus type 1, hypertension, prior episodes of DKA, history of non-ST elevation myocardial infarction, peripheral vascular disease with right above-the-knee amputation, hyperlipidemia who was admitted to [facility] on 04/14/2017 for evaluation of  severe anasarca.   1.  Generalized edema due to nephrotic syndrome. 2.  Nephrotic syndrome/proteinuria likely due to Diabetes Mellitus. 3.  CKD stage II 4.  Diabetes mellitus type I with CKD.  5.   Hypertension.  Plan:  The patient presented with an interesting case. He has significant generalized edema now. We suspect that he does have nephrotic syndrome most likely from diabetes mellitus type 1 however we will need to perform additional serologic workup included checking for HIV. For now we will discontinue bolus insulin and start him on furosemide drip. In addition we will add albumin 25 g IV every 8 hours. Serum potassium also noted to be low therefore we will provide the patient potassium chloride 40 mEq IV today. He will need improved glycemic control as he is at high risk for further deterioration of his renal function over time. Thanks for consultation.

## 2017-04-15 NOTE — Consult Note (Signed)
WOC Nurse wound consult note Reason for Consult:Nonhealing wound unknown etiology to left lateral thigh.  Dm poorly controlled Wound type:Unknown, patient unaware of boils, insect bites or trauma Pressure Injury POA: NA Measurement: 2 cm x 1 cm x 0.3 cm  Wound ZOX:WRUEA red, friable Drainage (amount, consistency, odor) minimal serosanguinous  No odor Periwound:intact Dressing procedure/placement/frequency:Cleanse wound to left lateral thigh with NS.  Pat dry.  Apply mupirocin to wound bed.  Cover with dry dressing.  Twice daily.  Will not follow at this time.  Please re-consult if needed.  Maple Hudson RN BSN CWON Pager [phone]

## 2017-04-15 NOTE — Progress Notes (Signed)
[facility] - [place] at [facility]   PATIENT NAME: [name]    MR#:  [id number]  DATE OF BIRTH:  09/09/1985  SUBJECTIVE:  CHIEF COMPLAINT:  Patient is feeling better  REVIEW OF SYSTEMS:  CONSTITUTIONAL: No fever, fatigue or weakness. Reporting weight gain EYES: No blurred or double vision.  EARS, NOSE, AND THROAT: No tinnitus or ear pain.  RESPIRATORY: No cough, shortness of breath, wheezing or hemoptysis.  CARDIOVASCULAR: No chest pain, orthopnea, edema.  GASTROINTESTINAL: No nausea, vomiting, diarrhea or abdominal pain.  GENITOURINARY: No dysuria, hematuria.  ENDOCRINE: No polyuria, nocturia,  HEMATOLOGY: No anemia, easy bruising or bleeding SKIN: Reporting swelling of the legs No rash or lesion. MUSCULOSKELETAL: No joint pain or arthritis.   NEUROLOGIC: No tingling, numbness, weakness.  PSYCHIATRY: No anxiety or depression.   DRUG ALLERGIES:  No Known Allergies  VITALS:  Blood pressure (!) 162/96, pulse 93, temperature 97.7 F (36.5 C), temperature source Oral, resp. rate 17, height  (1.702 m), weight 105 kg (231 lb 6.4 oz), SpO2 100 %.  PHYSICAL EXAMINATION:  GENERAL:  31 y.o.-year-old patient lying in the bed with no acute distress.  EYES: Pupils equal, round, reactive to light and accommodation. No scleral icterus. Extraocular muscles intact.  HEENT: Head atraumatic, normocephalic. Oropharynx and nasopharynx clear.  NECK:  Supple, no jugular venous distention. No thyroid enlargement, no tenderness.  LUNGS: Normal breath sounds bilaterally, no wheezing, rales,rhonchi or crepitation. No use of accessory muscles of respiration.  CARDIOVASCULAR: S1, S2 normal. No murmurs, rubs, or gallops.  ABDOMEN: Soft, nontender, nondistended. Bowel sounds present. No organomegaly or mass.  EXTREMITIES: has 3+ pedal edema, no cyanosis, or clubbing. Right AKA NEUROLOGIC: Cranial nerves II through XII are intact. Muscle strength 5/5 in all extremities.  Sensation intact. Gait not checked.  PSYCHIATRIC: The patient is alert and oriented x 3.  SKIN: Ulcers on left lower extremity  LABORATORY PANEL:   CBC  Recent Labs Lab 04/15/17 0254  WBC 5.6  HGB 9.3*  HCT 27.6*  PLT 314   ------------------------------------------------------------------------------------------------------------------  Chemistries   Recent Labs Lab 04/14/17 1123 04/15/17 0254  NA 134* 137  K 3.1* 3.0*  CL 101 102  CO2 28 28  GLUCOSE 349* 109*  BUN 24* 21*  CREATININE 1.26* 1.27*  CALCIUM 7.9* 7.8*  MG 1.9  --   AST 46*  --   ALT 31  --   ALKPHOS 142*  --   BILITOT 0.4  --    ------------------------------------------------------------------------------------------------------------------  Cardiac Enzymes  Recent Labs Lab 04/14/17 1123  TROPONINI 0.04*  0.04*   ------------------------------------------------------------------------------------------------------------------  RADIOLOGY:  US Renal  Result Date: 04/15/2017 CLINICAL DATA:  Renal insufficiency stage II, anasarca, hypertension, diabetes. EXAM: RENAL / URINARY TRACT ULTRASOUND COMPLETE COMPARISON:  Renal ultrasound of April 14, 2017 FINDINGS: Right Kidney: Length: 13.5 cm. The renal cortical echotexture remains slightly lower than that of the adjacent liver. There is no hydronephrosis. Left Kidney: Length: 13 cm. The renal cortical echotexture is similar to that on the right. There is no hydronephrosis. Bladder: Appears normal for degree of bladder distention. IMPRESSION: No hydronephrosis. Mildly increased renal cortical echotexture still lower than that of the liver. This likely reflects medical renal disease. Electronically Signed   By: David  [place] M.D.   On: 04/15/2017 13:02   US Renal  Result Date: 04/14/2017 CLINICAL DATA:  Anasarca. EXAM: RENAL / URINARY TRACT ULTRASOUND COMPLETE COMPARISON:  None FINDINGS: Right Kidney: Length: 12.2 cm. No hydronephrosis. Possible mild  increased renal echogenicity. Left Kidney: Length: 12.0 cm. No hydronephrosis. Possible mild increased renal echogenicity. Bladder: Apparent mild bladder wall thickening is favored to be due to underdistention. IMPRESSION: 1.  No hydronephrosis. 2. Possible mild increased renal cortical echogenicity which can be seen with medical renal disease. Electronically Signed   By: Jeronimo Greaves M.D.   On: 04/14/2017 10:48    EKG:   Orders placed or performed during the hospital encounter of 04/14/17  . ED EKG  . ED EKG  . EKG 12-Lead  . EKG 12-Lead  . EKG    ASSESSMENT AND PLAN:    Anasarca, 2/2 nephrotic syndrome. With hypoalbuminemia The patient will be admitted to medical floor. Patient is started on Lasix drip IV albumin Check BMP Follow-up with nephrology  Left lower extremity ulcer  Will get wound culture and sensitivity, wound care. If necessary will put a consult to Gen. Surgery  Chronic kidney disease stage II  Avoid nephrotoxins and follow up with nephrology. Monitor renal function  DM -ISS  Hypokalemia. Give potassium supplement, follow-up K level and magnesium level.  Elevated troponin.  Patient denies any chest pain today Troponins 0.042  Started aspirin and Lipitor. LDL 347   Hyperlipidemia in the setting of nephrotic syndrome LDL 347 start patient on statin  Hypertension. Continue Lasix and lisinopril, IV hydralazine when necessary.  Diabetes. Continue Levemir, NovoLog before meals and sliding scale. Check A1c  Anemia of chronic disease. Stable.    All the records are reviewed and case discussed with Care Management/Social Workerr. Management plans discussed with the patient, family and they are in agreement.  CODE STATUS: fc  TOTAL TIME TAKING CARE OF THIS PATIENT: 36 minutes.   POSSIBLE D/C IN 2 DAYS, DEPENDING ON CLINICAL CONDITION.  Note: This dictation was prepared with Dragon dictation along with smaller phrase technology. Any transcriptional  errors that result from this process are unintentional.   Ramonita Lab M.D on 04/15/2017 at 5:19 PM  Between 7am to 6pm - Pager - [phone] After 6pm go to [email or web address] - password [email or web address] [facility]  [facility]  Office  [phone]  CC: Primary care physician; Oswaldo Conroy, MD

## 2017-04-16 LAB — BASIC METABOLIC PANEL
ANION GAP: 5 (ref 5–15)
BUN: 21 mg/dL — ABNORMAL HIGH (ref 6–20)
CALCIUM: 7.6 mg/dL — AB (ref 8.9–10.3)
CO2: 29 mmol/L (ref 22–32)
Chloride: 102 mmol/L (ref 101–111)
Creatinine, Ser: 1.13 mg/dL (ref 0.61–1.24)
GLUCOSE: 147 mg/dL — AB (ref 65–99)
Potassium: 3.4 mmol/L — ABNORMAL LOW (ref 3.5–5.1)
Sodium: 136 mmol/L (ref 135–145)

## 2017-04-16 LAB — CBC
HCT: 26.9 % — ABNORMAL LOW (ref 40.0–52.0)
Hemoglobin: 9.3 g/dL — ABNORMAL LOW (ref 13.0–18.0)
MCH: 27 pg (ref 26.0–34.0)
MCHC: 34.5 g/dL (ref 32.0–36.0)
MCV: 78.3 fL — ABNORMAL LOW (ref 80.0–100.0)
Platelets: 292 10*3/uL (ref 150–440)
RBC: 3.44 MIL/uL — ABNORMAL LOW (ref 4.40–5.90)
RDW: 16.1 % — ABNORMAL HIGH (ref 11.5–14.5)
WBC: 4.5 10*3/uL (ref 3.8–10.6)

## 2017-04-16 LAB — PROTEIN ELECTRO, RANDOM URINE
Albumin ELP, Urine: 32.7 %
Alpha-1-Globulin, U: 9.8 %
Alpha-2-Globulin, U: 18.8 %
BETA GLOBULIN, U: 19.6 %
GAMMA GLOBULIN, U: 19.1 %
Total Protein, Urine: 793.5 mg/dL

## 2017-04-16 LAB — PROTEIN ELECTROPHORESIS, SERUM
A/G Ratio: 0.3 — ABNORMAL LOW (ref 0.7–1.7)
Albumin ELP: 1.1 g/dL — ABNORMAL LOW (ref 2.9–4.4)
Alpha-1-Globulin: 0.2 g/dL (ref 0.0–0.4)
Alpha-2-Globulin: 1.2 g/dL — ABNORMAL HIGH (ref 0.4–1.0)
Beta Globulin: 1.2 g/dL (ref 0.7–1.3)
GAMMA GLOBULIN: 0.7 g/dL (ref 0.4–1.8)
Globulin, Total: 3.3 g/dL (ref 2.2–3.9)
TOTAL PROTEIN ELP: 4.4 g/dL — AB (ref 6.0–8.5)

## 2017-04-16 LAB — HIV ANTIBODY (ROUTINE TESTING W REFLEX): HIV SCREEN 4TH GENERATION: NONREACTIVE

## 2017-04-16 LAB — C3 COMPLEMENT: C3 Complement: 188 mg/dL — ABNORMAL HIGH (ref 82–167)

## 2017-04-16 LAB — MPO/PR-3 (ANCA) ANTIBODIES
ANCA Proteinase 3: <3.5 U/mL (ref 0.0–3.5)
Myeloperoxidase Abs: <9 U/mL (ref 0.0–9.0)

## 2017-04-16 LAB — HEPATITIS B SURFACE ANTIGEN: Hepatitis B Surface Ag: NEGATIVE

## 2017-04-16 LAB — HCV COMMENT:

## 2017-04-16 LAB — GLUCOSE, CAPILLARY
GLUCOSE-CAPILLARY: 161 mg/dL — AB (ref 65–99)
Glucose-Capillary: 65 mg/dL (ref 65–99)
Glucose-Capillary: 131 mg/dL — ABNORMAL HIGH (ref 65–99)
Glucose-Capillary: 98 mg/dL (ref 65–99)
Glucose-Capillary: 154 mg/dL — ABNORMAL HIGH (ref 65–99)

## 2017-04-16 LAB — HEMOGLOBIN A1C
Hgb A1c MFr Bld: 14.1 % — ABNORMAL HIGH (ref 4.8–5.6)
Mean Plasma Glucose: 357.97 mg/dL

## 2017-04-16 LAB — GLOMERULAR BASEMENT MEMBRANE ANTIBODIES: GBM AB: 2 U (ref 0–20)

## 2017-04-16 LAB — HEPATITIS C ANTIBODY (REFLEX): HCV Ab: <0.1 s/co ratio (ref 0.0–0.9)

## 2017-04-16 LAB — ALBUMIN: ALBUMIN: 1.8 g/dL — AB (ref 3.5–5.0)

## 2017-04-16 LAB — ANA W/REFLEX IF POSITIVE: ANA: NEGATIVE

## 2017-04-16 LAB — C4 COMPLEMENT: COMPLEMENT C4, BODY FLUID: 40 mg/dL (ref 14–44)

## 2017-04-16 LAB — HEPATITIS B SURFACE ANTIBODY,QUALITATIVE: Hep B S Ab: NONREACTIVE

## 2017-04-16 LAB — POTASSIUM: Potassium: 3.9 mmol/L (ref 3.5–5.1)

## 2017-04-16 MED ORDER — POTASSIUM CHLORIDE CRYS ER 20 MEQ PO TBCR
20.0000 meq | EXTENDED_RELEASE_TABLET | Freq: Once | ORAL | Status: AC
  Administered 2017-04-16: 20 meq via ORAL
  Filled 2017-04-16: qty 1

## 2017-04-16 MED ORDER — POTASSIUM CHLORIDE CRYS ER 10 MEQ PO TBCR
30.0000 meq | EXTENDED_RELEASE_TABLET | Freq: Two times a day (BID) | ORAL | Status: DC
  Administered 2017-04-16: 10:00:00 30 meq via ORAL
  Filled 2017-04-16: qty 3

## 2017-04-16 NOTE — Progress Notes (Signed)
[place] at [place] NAME: [name]    MR#:  [id number]  DATE OF BIRTH:  10/04/1985  SUBJECTIVE:  CHIEF COMPLAINT:  Patient is feeling better, diuresing  REVIEW OF SYSTEMS:  CONSTITUTIONAL: No fever, fatigue or weakness. Reporting weight gain EYES: No blurred or double vision.  EARS, NOSE, AND THROAT: No tinnitus or ear pain.  RESPIRATORY: No cough, shortness of breath, wheezing or hemoptysis.  CARDIOVASCULAR: No chest pain, orthopnea, edema.  GASTROINTESTINAL: No nausea, vomiting, diarrhea or abdominal pain.  GENITOURINARY: No dysuria, hematuria.  ENDOCRINE: No polyuria, nocturia,  HEMATOLOGY: No anemia, easy bruising or bleeding SKIN: Reporting swelling of the legs  , Left thigh with a wound MUSCULOSKELETAL: No joint pain or arthritis.   NEUROLOGIC: No tingling, numbness, weakness.  PSYCHIATRY: No anxiety or depression.   DRUG ALLERGIES:  No Known Allergies  VITALS:  Blood pressure 136/80, pulse 89, temperature 98.6 F (37 C), temperature source Oral, resp. rate 20, height 5\' 7"  (1.702 m), weight 105 kg (231 lb 6.4 oz), SpO2 100 %.  PHYSICAL EXAMINATION:  GENERAL:  31 y.o.-year-old patient lying in the bed with no acute distress.  EYES: Pupils equal, round, reactive to light and accommodation. No scleral icterus. Extraocular muscles intact.  HEENT: Head atraumatic, normocephalic. Oropharynx and nasopharynx clear.  NECK:  Supple, no jugular venous distention. No thyroid enlargement, no tenderness.  LUNGS: Normal breath sounds bilaterally, no wheezing, rales,rhonchi or crepitation. No use of accessory muscles of respiration.  CARDIOVASCULAR: S1, S2 normal. No murmurs, rubs, or gallops.  ABDOMEN: Soft, nontender, nondistended. Bowel sounds present. No organomegaly or mass.  EXTREMITIES: has 2+ pedal edema, no cyanosis, or clubbing. Right AKA; left lower extremity with stage II wound, no purulent discharge  nontender NEUROLOGIC: Cranial nerves II through XII are intact. Muscle strength 5/5 in all extremities. Sensation intact. Gait not checked.  PSYCHIATRIC: The patient is alert and oriented x 3.  SKIN: Ulcers on left lower extremity  LABORATORY PANEL:   CBC  Recent Labs Lab 04/16/17 0654  WBC 4.5  HGB 9.3*  HCT 26.9*  PLT 292   ------------------------------------------------------------------------------------------------------------------  Chemistries   Recent Labs Lab 04/14/17 1123  04/16/17 0654  NA 134*  < > 136  K 3.1*  < > 3.4*  CL 101  < > 102  CO2 28  < > 29  GLUCOSE 349*  < > 147*  BUN 24*  < > 21*  CREATININE 1.26*  < > 1.13  CALCIUM 7.9*  < > 7.6*  MG 1.9  --   --   AST 46*  --   --   ALT 31  --   --   ALKPHOS 142*  --   --   BILITOT 0.4  --   --   < > = values in this interval not displayed. ------------------------------------------------------------------------------------------------------------------  Cardiac Enzymes  Recent Labs Lab 04/14/17 1123  TROPONINI 0.04*  0.04*   ------------------------------------------------------------------------------------------------------------------  RADIOLOGY:  US Renal  Result Date: 04/15/2017 CLINICAL DATA:  Renal insufficiency stage II, anasarca, hypertension, diabetes. EXAM: RENAL / URINARY TRACT ULTRASOUND COMPLETE COMPARISON:  Renal ultrasound of April 14, 2017 FINDINGS: Right Kidney: Length: 13.5 cm. The renal cortical echotexture remains slightly lower than that of the adjacent liver. There is no hydronephrosis. Left Kidney: Length: 13 cm. The renal cortical echotexture is similar to that on the right. There is no hydronephrosis. Bladder: Appears normal for degree of bladder distention. IMPRESSION: No hydronephrosis. Mildly increased renal  cortical echotexture still lower than that of the liver. This likely reflects medical renal disease. Electronically Signed   By: David  Martinique M.D.   On: 04/15/2017  13:02    EKG:   Orders placed or performed during the hospital encounter of 04/14/17  . ED EKG  . ED EKG  . EKG 12-Lead  . EKG 12-Lead  . EKG    ASSESSMENT AND PLAN:    Anasarca, 2/2 nephrotic syndrome. 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 If necessary will put a consult to Gen. Surgery  Chronic kidney disease stage II  Avoid nephrotoxins and follow up with nephrology. Monitor renal function  DM -ISS  Hypokalemia. Give potassium supplement, follow-up K level and magnesium level.  Elevated troponin.  Patient denies any chest pain today Troponins 0.042  Started aspirin and Lipitor. LDL 347   Hyperlipidemia in the setting of nephrotic syndrome LDL 347 start patient on statin  Hypertension. Continue Lasix and lisinopril, IV hydralazine when necessary.  Diabetes. Continue Levemir, NovoLog before meals and sliding scale. Check A1c  Anemia of chronic disease. Stable.  Left lower extremity wound-wound care and wound culture and sensitivity ordered; doesn't seem to be infected no antibiotics are needed at this time    All the records are reviewed and case discussed with Care Management/Social Workerr. Management plans discussed with the patient, family and they are in agreement.  CODE STATUS: fc  TOTAL TIME TAKING CARE OF THIS PATIENT: 36 minutes.   POSSIBLE D/C IN 2 DAYS, DEPENDING ON CLINICAL CONDITION.  Note: This dictation was prepared with Dragon dictation along with smaller phrase technology. Any transcriptional errors that result from this process are unintentional.   Nicholes Mango M.D on 04/16/2017 at 3:34 PM  Between 7am to 6pm - Pager -  [phone] After 6pm go to [email or web address] - password [email or web address] [place] [facility]  Office  [phone]  CC: Primary care physician; Letta Median, MD

## 2017-04-16 NOTE — Progress Notes (Addendum)
MEDICATION RELATED CONSULT NOTE - INITIAL   Pharmacy Consult for electrolyte replacement per protocol  Indication: hypokalemia, poss hypocalcemia (no albumin resulted yet).   No Known Allergies  Patient Measurements: Height:  (170.2 cm) Weight: 231 lb 6.4 oz (105 kg) IBW/kg (Calculated) : 66.1   Vital Signs: Temp: 98.6 F (37 C) (10/10 1154) Temp Source: Oral (10/10 1154) BP: 136/80 (10/10 1154) Pulse Rate: 89 (10/10 1154) Intake/Output from previous day: 10/09 0701 - 10/10 0700 In: 997.5 [P.O.:720; I.V.:77.5; IV Piggyback:200] Out: 2450 [Urine:2450] Intake/Output from this shift: No intake/output data recorded.  Labs:  Recent Labs  04/14/17 1123 04/15/17 0254 04/15/17 1630 04/16/17 0654 04/16/17 1848  WBC 5.9 5.6  --  4.5  --   HGB 10.2* 9.3*  --  9.3*  --   HCT 30.4* 27.6*  --  26.9*  --   PLT 320 314  --  292  --   CREATININE 1.26* 1.27*  --  1.13  --   LABCREA  --   --  75  --   --   MG 1.9  --   --   --   --   ALBUMIN 1.3*  --   --   --  1.8*  PROT 5.4*  --   --   --   --   AST 46*  --   --   --   --   ALT 31  --   --   --   --   ALKPHOS 142*  --   --   --   --   BILITOT 0.4  --   --   --   --    Estimated Creatinine Clearance: 107.4 mL/min (by C-G formula based on SCr of 1.13 mg/dL).   Microbiology: Recent Results (from the past 720 hour(s))  Aerobic/Anaerobic Culture (surgical/deep wound)     Status: None (Preliminary result)   Collection Time: 04/15/17  1:46 PM  Result Value Ref Range Status   Specimen Description LEG RIGHT LEG  Final   Special Requests NONE  Final   Gram Stain   Final    RARE WBC PRESENT, PREDOMINANTLY PMN MODERATE GRAM POSITIVE COCCI IN PAIRS IN CLUSTERS RARE GRAM NEGATIVE DIPLOCOCCI    Culture   Final    TOO YOUNG TO READ Performed at [facility], [address] [place], [place] [address]    Report Status PENDING  Incomplete    Medical History: Past Medical History:  Diagnosis Date  . Abscess of groin,  right 09/09/2016  . 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 Acute gastroenteritis 09/11/2016  . AKI (acute kidney injury) (HCC) 02/19/2013  . Anemia 12/14/2015  . Dehydration 09/11/2016  . Diabetes mellitus without complication (HCC)   . Diabetic acidosis without coma (HCC) 06/01/2016  . Elevated bilirubin 09/11/2016  . Essential hypertension with goal blood pressure less than 130/80 02/07/2015  . Hypercholesterolemia 02/25/2013  . Hyponatremia 09/11/2016  . Hypophosphatemia 12/14/2015  . Hypovitaminosis D 12/16/2015  . Mass of right inguinal region   . Microcytic anemia 11/17/2014  . Non-ST elevation myocardial infarction (NSTEMI) (HCC) 02/19/2013  . Poorly controlled type 1 diabetes mellitus (HCC) 03/03/2012   Overview:  Diagnosed 02/2012.  Now insulin-dependent  . Proteinuria 12/14/2015    Medications:  Prescriptions Prior to Admission  Medication Sig Dispense Refill Last Dose  . furosemide (LASIX) 40 MG tablet Take 1 tablet (40 mg total) by mouth daily. 7 tablet  0 04/13/2017 at 0800  . insulin aspart (NOVOLOG) 100 UNIT/ML injection Inject 20 Units into the skin 3 (three) times daily before meals. (Patient taking differently: Inject 10 Units into the skin 3 (three) times daily before meals. ) 10 mL 11 04/13/2017 at 0800  . Insulin Detemir (LEVEMIR FLEXPEN [place]) Inject 30 Units into the skin at bedtime.   04/13/2017 at 2100  . amoxicillin-clavulanate (AUGMENTIN) 875-125 MG tablet Take 1 tablet by mouth 2 (two) times daily. (Patient not taking: Reported on 04/14/2017) 20 tablet 0 Not Taking at Unknown time  . insulin glargine (LANTUS) 100 UNIT/ML injection Inject 0.1 mLs (10 Units total) into the skin 2 (two) times daily. (Patient not taking: Reported on 04/14/2017) 10 mL 11 Not Taking at 0800   Scheduled:  . aspirin EC  325 mg Oral Daily  . atorvastatin  40 mg Oral q1800  . enoxaparin (LOVENOX) injection  40 mg Subcutaneous Q24H  . Influenza vac split quadrivalent PF  0.5 mL  Intramuscular Tomorrow-1000  . insulin aspart  0-5 Units Subcutaneous QHS  . insulin aspart  0-9 Units Subcutaneous TID WC  . insulin aspart  6 Units Subcutaneous TID AC  . insulin glargine  30 Units Subcutaneous QHS  . lisinopril  10 mg Oral Daily  . mupirocin cream   Topical BID   Infusions:  . albumin human 25 g (04/16/17 1401)  . furosemide (LASIX) infusion 8 mg/hr (04/16/17 1252)   PRN: acetaminophen **OR** acetaminophen, albuterol, bisacodyl, hydrALAZINE, HYDROcodone-acetaminophen, ondansetron **OR** ondansetron (ZOFRAN) IV, senna-docusate Anti-infectives    None      Assessment: Hypokalemia; K+ 3.4 this morning patient has been on KCl po bid since 10/10am Goal of Therapy:  Electrolytes WNL  Plan:  Re-check potassium at 1700 today, check albumin to determine corrected calcium. BMP tomorrow with magnesium. Hypokalemia; K+ 3.4 this morning patient has been on KCl po bid since 10/10am, will give additional KCl now and recheck in a couple hours.   10/10@1945  Lab Results  Component Value Date   K 3.9 04/16/2017   Will recheck K+ in am, d/c KCl po bid at this time.   Samil Mecham 04/16/2017,7:44 PM

## 2017-04-16 NOTE — Progress Notes (Signed)
Page received from [employer].  Patient is ordered Novolog 6 units with meals and CBG is 96.  RN wonders if he should give insulin.  Per order Novolog 6 units should be held if CBG<80 or if patient eats less than 50%.  Encouraged him to make sure that patient eats meal prior to giving meal coverage and to call MD if he is concerned about dose of insulin to be given.   Thanks, Beryl Meager, RN, BC-ADM Inpatient Diabetes Coordinator Pager [phone] (8a-5p)

## 2017-04-16 NOTE — Progress Notes (Signed)
[facility] [place] [facility]  ROUNDING NOTE   Subjective:  Patient continues to diurese fairly well. Yesterday he had 2.4 L of urine output. Thus far today he's had 1.2 L of urine output.    Objective:  Vital signs in last 24 hours:  Temp:  [98.2 F (36.8 C)-98.3 F (36.8 C)] 98.2 F (36.8 C) (10/10 1026) Pulse Rate:  [92-99] 99 (10/10 1026) Resp:  [22] 22 (10/10 0436) BP: (133-148)/(81-91) 133/81 (10/10 1026) SpO2:  [100 %] 100 % (10/10 1026)  Weight change:  Filed Weights   04/14/17 1422  Weight: 105 kg (231 lb 6.4 oz)    Intake/Output: I/O last 3 completed shifts: In: 1237.5 [P.O.:960; I.V.:77.5; IV Piggyback:200] Out: 4825 [Urine:4825]   Intake/Output this shift:  Total I/O In: 480 [P.O.:480] Out: 1200 [Urine:1200]  Physical Exam: General: No acute distress  Head: Normocephalic, atraumatic. Moist oral mucosal membranes  Eyes: Mild periorbital edema  Neck: Supple, trachea midline  Lungs:  Clear to auscultation, normal effort  Heart: S1S2 no rubs  Abdomen:  Soft, nontender, bowel sounds present  Extremities: 3+ edema LLE, R AKA, 1+ edema in UE's  Neurologic: Awake, alert, following commands  Skin: No lesions  GU:  Penile and scrotal edema noted    Basic Metabolic Panel:  Recent Labs Lab 04/14/17 1123 04/15/17 0254 04/16/17 0654  NA 134* 137 136  K 3.1* 3.0* 3.4*  CL 101 102 102  CO2 28 28 29   GLUCOSE 349* 109* 147*  BUN 24* 21* 21*  CREATININE 1.26* 1.27* 1.13  CALCIUM 7.9* 7.8* 7.6*  MG 1.9  --   --     Liver Function Tests:  Recent Labs Lab 04/14/17 1123  AST 46*  ALT 31  ALKPHOS 142*  BILITOT 0.4  PROT 5.4*  ALBUMIN 1.3*   No results for input(s): LIPASE, AMYLASE in the last 168 hours. No results for input(s): AMMONIA in the last 168 hours.  CBC:  Recent Labs Lab 04/14/17 1123 04/15/17 0254 04/16/17 0654  WBC 5.9 5.6 4.5  NEUTROABS 4.0  --   --   HGB 10.2* 9.3* 9.3*  HCT 30.4* 27.6* 26.9*  MCV 78.3* 77.5* 78.3*  PLT 320  314 292    Cardiac Enzymes:  Recent Labs Lab 04/14/17 1123  TROPONINI 0.04*  0.04*    BNP: Invalid input(s): POCBNP  CBG:  Recent Labs Lab 04/15/17 0905 04/15/17 1149 04/15/17 1649 04/15/17 2142 04/16/17 0758  GLUCAP 98 204* 211* 161* 131*    Microbiology: Results for orders placed or performed during the hospital encounter of 04/14/17  Aerobic/Anaerobic Culture (surgical/deep wound)     Status: None (Preliminary result)   Collection Time: 04/15/17  1:46 PM  Result Value Ref Range Status   Specimen Description LEG RIGHT LEG  Final   Special Requests NONE  Final   Gram Stain   Final    RARE WBC PRESENT, PREDOMINANTLY PMN MODERATE GRAM POSITIVE COCCI IN PAIRS IN CLUSTERS RARE GRAM NEGATIVE DIPLOCOCCI    Culture   Final    TOO YOUNG TO READ Performed at [facility], [place] [address], [place], [place] [address]    Report Status PENDING  Incomplete    Coagulation Studies: No results for input(s): LABPROT, INR in the last 72 hours.  Urinalysis:  Recent Labs  04/14/17 1233  COLORURINE STRAW*  LABSPEC 1.014  PHURINE 7.0  GLUCOSEU >=500*  HGBUR SMALL*  BILIRUBINUR NEGATIVE  KETONESUR NEGATIVE  PROTEINUR >=300*  NITRITE NEGATIVE  LEUKOCYTESUR NEGATIVE  Imaging: US Renal  Result Date: 04/15/2017 CLINICAL DATA:  Renal insufficiency stage II, anasarca, hypertension, diabetes. EXAM: RENAL / URINARY TRACT ULTRASOUND COMPLETE COMPARISON:  Renal ultrasound of April 14, 2017 FINDINGS: Right Kidney: Length: 13.5 cm. The renal cortical echotexture remains slightly lower than that of the adjacent liver. There is no hydronephrosis. Left Kidney: Length: 13 cm. The renal cortical echotexture is similar to that on the right. There is no hydronephrosis. Bladder: Appears normal for degree of bladder distention. IMPRESSION: No hydronephrosis. Mildly increased renal cortical echotexture still lower than that of the liver. This likely reflects medical renal disease.  Electronically Signed   By: David  Martinique M.D.   On: 04/15/2017 13:02     Medications:   . albumin human    . furosemide (LASIX) infusion 6 mg/hr (04/15/17 1406)   . aspirin EC  325 mg Oral Daily  . atorvastatin  40 mg Oral q1800  . enoxaparin (LOVENOX) injection  40 mg Subcutaneous Q24H  . Influenza vac split quadrivalent PF  0.5 mL Intramuscular Tomorrow-1000  . insulin aspart  0-5 Units Subcutaneous QHS  . insulin aspart  0-9 Units Subcutaneous TID WC  . insulin aspart  6 Units Subcutaneous TID AC  . insulin glargine  30 Units Subcutaneous QHS  . lisinopril  10 mg Oral Daily  . mupirocin cream   Topical BID  . potassium chloride  30 mEq Oral BID   acetaminophen **OR** acetaminophen, albuterol, bisacodyl, hydrALAZINE, HYDROcodone-acetaminophen, ondansetron **OR** ondansetron (ZOFRAN) IV, senna-docusate  Assessment/ Plan:  31 y.o. male  with a PMHx of Diabetes mellitus type 1, hypertension, prior episodes of DKA, history of non-ST elevation myocardial infarction, peripheral vascular disease with right above-the-knee amputation, hyperlipidemia who was admitted to [facility] on 04/14/2017 for evaluation of severe anasarca.   1.  Generalized edema due to nephrotic syndrome. 2.  Nephrotic syndrome/proteinuria likely due to Diabetes Mellitus.  Negative GBM/HIV screen/hepatitis screen 3.  CKD stage II 4.  Diabetes mellitus type I with CKD.  5.  Hypertension.  Plan:  Patient did have fairly good urine output yesterday. We will increase Lasix drip to 8 mg IV per hour. I encouraged patient to limit his by mouth fluid intake to no more than 1000 mL per day. He verbalized understanding of this. Continue to suspect that he most likely has diabetic nephropathy. Thus far serologic workup that is available is negative. Therefore no urgent indication for renal biopsy at the moment. We will continue to monitor his progress closely.   LOS: 2 Travin Marik 10/10/201811:33 AM

## 2017-04-16 NOTE — Progress Notes (Signed)
MEDICATION RELATED CONSULT NOTE - INITIAL   Pharmacy Consult for electrolyte replacement per protocol  Indication: hypokalemia, poss hypocalcemia (no albumin resulted yet).   No Known Allergies  Patient Measurements: Height:  (170.2 cm) Weight: 231 lb 6.4 oz (105 kg) IBW/kg (Calculated) : 66.1   Vital Signs: Temp: 98.6 F (37 C) (10/10 1154) Temp Source: Oral (10/10 1154) BP: 136/80 (10/10 1154) Pulse Rate: 89 (10/10 1154) Intake/Output from previous day: 10/09 0701 - 10/10 0700 In: 997.5 [P.O.:720; I.V.:77.5; IV Piggyback:200] Out: 2450 [Urine:2450] Intake/Output from this shift: Total I/O In: 1200 [P.O.:1200] Out: 1200 [Urine:1200]  Labs:  Recent Labs  04/14/17 1123 04/15/17 0254 04/15/17 1630 04/16/17 0654  WBC 5.9 5.6  --  4.5  HGB 10.2* 9.3*  --  9.3*  HCT 30.4* 27.6*  --  26.9*  PLT 320 314  --  292  CREATININE 1.26* 1.27*  --  1.13  LABCREA  --   --  75  --   MG 1.9  --   --   --   ALBUMIN 1.3*  --   --   --   PROT 5.4*  --   --   --   AST 46*  --   --   --   ALT 31  --   --   --   ALKPHOS 142*  --   --   --   BILITOT 0.4  --   --   --    Estimated Creatinine Clearance: 107.4 mL/min (by C-G formula based on SCr of 1.13 mg/dL).   Microbiology: Recent Results (from the past 720 hour(s))  Aerobic/Anaerobic Culture (surgical/deep wound)     Status: None (Preliminary result)   Collection Time: 04/15/17  1:46 PM  Result Value Ref Range Status   Specimen Description LEG RIGHT LEG  Final   Special Requests NONE  Final   Gram Stain   Final    RARE WBC PRESENT, PREDOMINANTLY PMN MODERATE GRAM POSITIVE COCCI IN PAIRS IN CLUSTERS RARE GRAM NEGATIVE DIPLOCOCCI    Culture   Final    TOO YOUNG TO READ Performed at [facility], [address] [place], [place] [address]    Report Status PENDING  Incomplete    Medical History: Past Medical History:  Diagnosis Date  . Abscess of groin, right 09/09/2016  . Acquired absence of right lower  extremity above knee (HCC) 02/15/2015  . Acute gastroenteritis 09/11/2016  . AKI (acute kidney injury) (HCC) 02/19/2013  . Anemia 12/14/2015  . Dehydration 09/11/2016  . Diabetes mellitus without complication (HCC)   . Diabetic acidosis without coma (HCC) 06/01/2016  . Elevated bilirubin 09/11/2016  . Essential hypertension with goal blood pressure less than 130/80 02/07/2015  . Hypercholesterolemia 02/25/2013  . Hyponatremia 09/11/2016  . Hypophosphatemia 12/14/2015  . Hypovitaminosis D 12/16/2015  . Mass of right inguinal region   . Microcytic anemia 11/17/2014  . Non-ST elevation myocardial infarction (NSTEMI) (HCC) 02/19/2013  . Poorly controlled type 1 diabetes mellitus (HCC) 03/03/2012   Overview:  Diagnosed 02/2012.  Now insulin-dependent  . Proteinuria 12/14/2015    Medications:  Prescriptions Prior to Admission  Medication Sig Dispense Refill Last Dose  . furosemide (LASIX) 40 MG tablet Take 1 tablet (40 mg total) by mouth daily. 7 tablet 0 04/13/2017 at 0800  . insulin aspart (NOVOLOG) 100 UNIT/ML injection Inject 20 Units into the skin 3 (three) times daily before meals. (Patient taking differently: Inject 10 Units into the skin 3 (three)  times daily before meals. ) 10 mL 11 04/13/2017 at 0800  . Insulin Detemir (LEVEMIR FLEXPEN [place]) Inject 30 Units into the skin at bedtime.   04/13/2017 at 2100  . amoxicillin-clavulanate (AUGMENTIN) 875-125 MG tablet Take 1 tablet by mouth 2 (two) times daily. (Patient not taking: Reported on 04/14/2017) 20 tablet 0 Not Taking at Unknown time  . insulin glargine (LANTUS) 100 UNIT/ML injection Inject 0.1 mLs (10 Units total) into the skin 2 (two) times daily. (Patient not taking: Reported on 04/14/2017) 10 mL 11 Not Taking at 0800   Scheduled:  . aspirin EC  325 mg Oral Daily  . atorvastatin  40 mg Oral q1800  . enoxaparin (LOVENOX) injection  40 mg Subcutaneous Q24H  . Influenza vac split quadrivalent PF  0.5 mL Intramuscular Tomorrow-1000  . insulin aspart  0-5  Units Subcutaneous QHS  . insulin aspart  0-9 Units Subcutaneous TID WC  . insulin aspart  6 Units Subcutaneous TID AC  . insulin glargine  30 Units Subcutaneous QHS  . lisinopril  10 mg Oral Daily  . mupirocin cream   Topical BID  . potassium chloride  30 mEq Oral BID  . potassium chloride  20 mEq Oral Once   Infusions:  . albumin human 25 g (04/16/17 1401)  . furosemide (LASIX) infusion 8 mg/hr (04/16/17 1252)   PRN: acetaminophen **OR** acetaminophen, albuterol, bisacodyl, hydrALAZINE, HYDROcodone-acetaminophen, ondansetron **OR** ondansetron (ZOFRAN) IV, senna-docusate Anti-infectives    None      Assessment: Hypokalemia; K+ 3.4 this morning patient has been on KCl po bid since 10/10am Goal of Therapy:  Electrolytes WNL  Plan:  Re-check potassium at 1700 today, check albumin to determine corrected calcium. BMP tomorrow with magnesium. Hypokalemia; K+ 3.4 this morning patient has been on KCl po bid since 10/10am, will give additional KCl now and recheck in a couple hours.   Jatasia Gundrum 04/16/2017,4:05 PM

## 2017-04-17 LAB — BASIC METABOLIC PANEL
Anion gap: 7 (ref 5–15)
BUN: 27 mg/dL — AB (ref 6–20)
CALCIUM: 8.3 mg/dL — AB (ref 8.9–10.3)
CO2: 32 mmol/L (ref 22–32)
CREATININE: 1.23 mg/dL (ref 0.61–1.24)
Chloride: 101 mmol/L (ref 101–111)
GFR calc Af Amer: >60 mL/min (ref >60)
Glucose, Bld: 100 mg/dL — ABNORMAL HIGH (ref 65–99)
Potassium: 3.4 mmol/L — ABNORMAL LOW (ref 3.5–5.1)
SODIUM: 140 mmol/L (ref 135–145)

## 2017-04-17 LAB — GLUCOSE, CAPILLARY
GLUCOSE-CAPILLARY: 302 mg/dL — AB (ref 65–99)
Glucose-Capillary: 72 mg/dL (ref 65–99)
Glucose-Capillary: 212 mg/dL — ABNORMAL HIGH (ref 65–99)
Glucose-Capillary: 288 mg/dL — ABNORMAL HIGH (ref 65–99)
Glucose-Capillary: 227 mg/dL — ABNORMAL HIGH (ref 65–99)

## 2017-04-17 LAB — MAGNESIUM: MAGNESIUM: 1.7 mg/dL (ref 1.7–2.4)

## 2017-04-17 MED ORDER — INSULIN ASPART 100 UNIT/ML ~~LOC~~ SOLN
3.0000 [IU] | Freq: Three times a day (TID) | SUBCUTANEOUS | Status: DC
  Administered 2017-04-17 – 2017-04-18 (×4): 3 [IU] via SUBCUTANEOUS
  Filled 2017-04-17 (×4): qty 1

## 2017-04-17 MED ORDER — INSULIN GLARGINE 100 UNIT/ML ~~LOC~~ SOLN
25.0000 [IU] | Freq: Every day | SUBCUTANEOUS | Status: DC
  Administered 2017-04-17: 25 [IU] via SUBCUTANEOUS
  Filled 2017-04-17 (×3): qty 0.25

## 2017-04-17 NOTE — Progress Notes (Signed)
[facility] NAME: [name]    MR#:  [id number]  DATE OF BIRTH:  1985/12/21  SUBJECTIVE:  CHIEF COMPLAINT:  Patient is feeling better, diuresing, accucheck at 70 this am  REVIEW OF SYSTEMS:  CONSTITUTIONAL: No fever, fatigue or weakness. Reporting weight gain EYES: No blurred or double vision.  EARS, NOSE, AND THROAT: No tinnitus or ear pain.  RESPIRATORY: No cough, shortness of breath, wheezing or hemoptysis.  CARDIOVASCULAR: No chest pain, orthopnea, edema.  GASTROINTESTINAL: No nausea, vomiting, diarrhea or abdominal pain.  GENITOURINARY: No dysuria, hematuria.  ENDOCRINE: No polyuria, nocturia,  HEMATOLOGY: No anemia, easy bruising or bleeding SKIN: Reporting swelling of the legs  , Left thigh with a wound MUSCULOSKELETAL: No joint pain or arthritis.   NEUROLOGIC: No tingling, numbness, weakness.  PSYCHIATRY: No anxiety or depression.   DRUG ALLERGIES:  No Known Allergies  VITALS:  Blood pressure (!) 159/83, pulse (!) 103, temperature 98.5 F (36.9 C), resp. rate 18, height 5\' 7"  (1.702 m), weight 105 kg (231 lb 6.4 oz), SpO2 99 %.  PHYSICAL EXAMINATION:  GENERAL:  31 y.o.-year-old patient lying in the bed with no acute distress.  EYES: Pupils equal, round, reactive to light and accommodation. No scleral icterus. Extraocular muscles intact.  HEENT: Head atraumatic, normocephalic. Oropharynx and nasopharynx clear.  NECK:  Supple, no jugular venous distention. No thyroid enlargement, no tenderness.  LUNGS: Normal breath sounds bilaterally, no wheezing, rales,rhonchi or crepitation. No use of accessory muscles of respiration.  CARDIOVASCULAR: S1, S2 normal. No murmurs, rubs, or gallops.  ABDOMEN: Soft, nontender, nondistended. Bowel sounds present. No organomegaly or mass.  EXTREMITIES: has 2+ pedal edema, no cyanosis, or clubbing. Right AKA; left lower extremity with stage II wound, no purulent discharge  nontender NEUROLOGIC: Cranial nerves II through XII are intact. Muscle strength 5/5 in all extremities. Sensation intact. Gait not checked.  PSYCHIATRIC: The patient is alert and oriented x 3.  SKIN: Ulcers on left lower extremity  LABORATORY PANEL:   CBC  Recent Labs Lab 04/16/17 0654  WBC 4.5  HGB 9.3*  HCT 26.9*  PLT 292   ------------------------------------------------------------------------------------------------------------------  Chemistries   Recent Labs Lab 04/14/17 1123  04/17/17 0534  NA 134*  < > 140  K 3.1*  < > 3.4*  CL 101  < > 101  CO2 28  < > 32  GLUCOSE 349*  < > 100*  BUN 24*  < > 27*  CREATININE 1.26*  < > 1.23  CALCIUM 7.9*  < > 8.3*  MG 1.9  --  1.7  AST 46*  --   --   ALT 31  --   --   ALKPHOS 142*  --   --   BILITOT 0.4  --   --   < > = values in this interval not displayed. ------------------------------------------------------------------------------------------------------------------  Cardiac Enzymes  Recent Labs Lab 04/14/17 1123  TROPONINI 0.04*  0.04*   ------------------------------------------------------------------------------------------------------------------  RADIOLOGY:  US Renal  Result Date: 04/15/2017 CLINICAL DATA:  Renal insufficiency stage II, anasarca, hypertension, diabetes. EXAM: RENAL / URINARY TRACT ULTRASOUND COMPLETE COMPARISON:  Renal ultrasound of April 14, 2017 FINDINGS: Right Kidney: Length: 13.5 cm. The renal cortical echotexture remains slightly lower than that of the adjacent liver. There is no hydronephrosis. Left Kidney: Length: 13 cm. The renal cortical echotexture is similar to that on the right. There is no hydronephrosis. Bladder: Appears normal for degree of bladder distention. IMPRESSION: No hydronephrosis. Mildly  increased renal cortical echotexture still lower than that of the liver. This likely reflects medical renal disease. Electronically Signed   By: David  Martinique M.D.   On: 04/15/2017  13:02    EKG:   Orders placed or performed during the hospital encounter of 04/14/17  . ED EKG  . ED EKG  . EKG 12-Lead  . EKG 12-Lead  . EKG    ASSESSMENT AND PLAN:    Anasarca, 2/2 nephrotic syndrome. With hypoalbuminemia Nephrotic syndrome probably secondary to diabetic nephropathy Patient is on Lasix drip, increased to 8 mg per hour; fluid restriction to 1 L per day IV albumin Check BMP Negative GBM ,HIV screen, hepatitis screen Follow-up with nephrology  Left lower extremity ulcer  Will get wound culture and sensitivity, wound care. If necessary will put a consult to Gen. Surgery  Chronic kidney disease stage II  Avoid nephrotoxins and follow up with nephrology. Monitor renal function  DM -ISS Nighttime snack advised as patient is becoming hypoglycemic in the early hours Lantus dose decreased to 25 units daily at bedtime   Hypokalemia. Give potassium supplement, follow-up K level and magnesium level.  Elevated troponin.  Patient denies any chest pain today Troponins 0.042  Started aspirin and Lipitor. LDL 347   Hyperlipidemia in the setting of nephrotic syndrome LDL 347 start patient on statin  Hypertension. Continue Lasix and lisinopril, IV hydralazine when necessary.  Diabetes. Continue Levemir, NovoLog before meals and sliding scale. Check A1c  Anemia of chronic disease. Stable.  Left lower extremity wound-wound care and wound culture and sensitivity ordered; doesn't seem to be infected no antibiotics are needed at this time    All the records are reviewed and case discussed with Care Management/Social Workerr. Management plans discussed with the patient, family and they are in agreement.  CODE STATUS: fc  TOTAL TIME TAKING CARE OF THIS PATIENT: 36 minutes.   POSSIBLE D/C IN 2 DAYS, DEPENDING ON CLINICAL CONDITION.  Note: This dictation was prepared with Dragon dictation along with smaller phrase technology. Any transcriptional errors that  result from this process are unintentional.   Nicholes Mango M.D on 04/17/2017 at 9:02 AM  Between 7am to 6pm - Pager - [phone] After 6pm go to [email or web address] - password [email or web address] [facility]  Office  [phone]  CC: Primary care physician; Letta Median, MD

## 2017-04-17 NOTE — Progress Notes (Signed)
MEDICATION RELATED CONSULT NOTE - FOLLOW UP    Pharmacy Consult for electrolyte replacement per protocol  Indication: hypokalemia, poss hypocalcemia (no albumin resulted yet).   No Known Allergies  Patient Measurements: Height:  (170.2 cm) Weight: 231 lb 6.4 oz (105 kg) IBW/kg (Calculated) : 66.1   Vital Signs: Temp: 98.5 F (36.9 C) (10/11 0456) BP: 159/83 (10/11 0456) Pulse Rate: 103 (10/11 0456) Intake/Output from previous day: 10/10 0701 - 10/11 0700 In: 1440 [P.O.:1440] Out: 9800 [Urine:9800] Intake/Output from this shift: Total I/O In: -  Out: 800 [Urine:800]  Labs:  Recent Labs  04/14/17 1123 04/15/17 0254 04/15/17 1630 04/16/17 0654 04/16/17 1848 04/17/17 0534  WBC 5.9 5.6  --  4.5  --   --   HGB 10.2* 9.3*  --  9.3*  --   --   HCT 30.4* 27.6*  --  26.9*  --   --   PLT 320 314  --  292  --   --   CREATININE 1.26* 1.27*  --  1.13  --  1.23  LABCREA  --   --  75  --   --   --   MG 1.9  --   --   --   --  1.7  ALBUMIN 1.3*  --   --   --  1.8*  --   PROT 5.4*  --   --   --   --   --   AST 46*  --   --   --   --   --   ALT 31  --   --   --   --   --   ALKPHOS 142*  --   --   --   --   --   BILITOT 0.4  --   --   --   --   --    Estimated Creatinine Clearance: 98.7 mL/min (by C-G formula based on SCr of 1.23 mg/dL).   Microbiology: Recent Results (from the past 720 hour(s))  Aerobic/Anaerobic Culture (surgical/deep wound)     Status: None (Preliminary result)   Collection Time: 04/15/17  1:46 PM  Result Value Ref Range Status   Specimen Description LEG RIGHT LEG  Final   Special Requests NONE  Final   Gram Stain   Final    RARE WBC PRESENT, PREDOMINANTLY PMN MODERATE GRAM POSITIVE COCCI IN PAIRS IN CLUSTERS RARE GRAM NEGATIVE DIPLOCOCCI    Culture   Final    TOO YOUNG TO READ Performed at [facility], [address] [place], [place] [address]    Report Status PENDING  Incomplete    Medical History: Past Medical History:   Diagnosis Date  . Abscess of groin, right 09/09/2016  . 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 Acute gastroenteritis 09/11/2016  . AKI (acute kidney injury) (HCC) 02/19/2013  . Anemia 12/14/2015  . Dehydration 09/11/2016  . Diabetes mellitus without complication (HCC)   . Diabetic acidosis without coma (HCC) 06/01/2016  . Elevated bilirubin 09/11/2016  . Essential hypertension with goal blood pressure less than 130/80 02/07/2015  . Hypercholesterolemia 02/25/2013  . Hyponatremia 09/11/2016  . Hypophosphatemia 12/14/2015  . Hypovitaminosis D 12/16/2015  . Mass of right inguinal region   . Microcytic anemia 11/17/2014  . Non-ST elevation myocardial infarction (NSTEMI) (HCC) 02/19/2013  . Poorly controlled type 1 diabetes mellitus (HCC) 03/03/2012   Overview:  Diagnosed 02/2012.  Now insulin-dependent  . Proteinuria  12/14/2015    Medications:  Prescriptions Prior to Admission  Medication Sig Dispense Refill Last Dose  . furosemide (LASIX) 40 MG tablet Take 1 tablet (40 mg total) by mouth daily. 7 tablet 0 04/13/2017 at 0800  . insulin aspart (NOVOLOG) 100 UNIT/ML injection Inject 20 Units into the skin 3 (three) times daily before meals. (Patient taking differently: Inject 10 Units into the skin 3 (three) times daily before meals. ) 10 mL 11 04/13/2017 at 0800  . Insulin Detemir (LEVEMIR FLEXPEN Viola) Inject 30 Units into the skin at bedtime.   04/13/2017 at 2100  . amoxicillin-clavulanate (AUGMENTIN) 875-125 MG tablet Take 1 tablet by mouth 2 (two) times daily. (Patient not taking: Reported on 04/14/2017) 20 tablet 0 Not Taking at Unknown time  . insulin glargine (LANTUS) 100 UNIT/ML injection Inject 0.1 mLs (10 Units total) into the skin 2 (two) times daily. (Patient not taking: Reported on 04/14/2017) 10 mL 11 Not Taking at 0800   Scheduled:  . aspirin EC  325 mg Oral Daily  . atorvastatin  40 mg Oral q1800  . enoxaparin (LOVENOX) injection  40 mg Subcutaneous Q24H  . Influenza  vac split quadrivalent PF  0.5 mL Intramuscular Tomorrow-1000  . insulin aspart  0-5 Units Subcutaneous QHS  . insulin aspart  0-9 Units Subcutaneous TID WC  . insulin aspart  3 Units Subcutaneous TID AC  . insulin glargine  30 Units Subcutaneous QHS  . lisinopril  10 mg Oral Daily  . mupirocin cream   Topical BID   Infusions:  . albumin human 25 g (04/16/17 1401)  . furosemide (LASIX) infusion 8 mg/hr (04/17/17 0300)   PRN: acetaminophen **OR** acetaminophen, albuterol, bisacodyl, hydrALAZINE, HYDROcodone-acetaminophen, ondansetron **OR** ondansetron (ZOFRAN) IV, senna-docusate Anti-infectives    None      Assessment: Hypokalemia; K+ 3.4 this morning patient has been on KCl po bid since 10/10am Goal of Therapy:  Electrolytes WNL  Plan:  Will give KCl 40 mEq PO x 1.   [name] D 04/17/2017,9:00 AM

## 2017-04-17 NOTE — Progress Notes (Signed)
Inpatient Diabetes Program Recommendations  AACE/ADA: New Consensus Statement on Inpatient Glycemic Control (2015)  Target Ranges:  Prepandial:   less than 140 mg/dL      Peak postprandial:   less than 180 mg/dL (1-2 hours)      Critically ill patients:  140 - 180 mg/dL   Lab Results  Component Value Date   GLUCAP 72 04/17/2017   HGBA1C 14.1 (H) 04/16/2017    Review of Glycemic Control   Results for JAHQUEZ, STEFFLER (MRN [id number]) as of 04/17/2017 09:06  Ref. Range 04/16/2017 07:58 04/16/2017 11:54 04/16/2017 16:35 04/16/2017 20:19 04/17/2017 07:26  Glucose-Capillary Latest Ref Range: 65 - 99 mg/dL 782 (H) 98 956 (H) 213 (H) 72    Home DM Meds: Levemir 30 units QHS                             Novolog 10 units TID with meals  Current Insulin Orders: Lantus 25 units QHS                                       Novolog Sensitive Correction Scale/ SSI (0-9 units) TID AC + HS                                       Novolog 3 units TID with meals   Changes to insulin made this am after consultation with MD.  Susette Racer, RN, BA, MHA, CDE Diabetes Coordinator Inpatient Diabetes Program  [phone] (Team Pager) [phone] [facility] Office) 04/17/2017 9:12 AM

## 2017-04-17 NOTE — Progress Notes (Signed)
[facility] [place] [facility]  ROUNDING NOTE   Subjective:  Patient had massive diuresis yesterday. Urine output was 9.8 L yesterday. He was -8.3 L. Thus far today he's had 4.3 L of urine output.  Objective:  Vital signs in last 24 hours:  Temp:  [98 F (36.7 C)-98.7 F (37.1 C)] 98 F (36.7 C) (10/11 1321) Pulse Rate:  [92-107] 107 (10/11 1503) Resp:  [18-20] 18 (10/11 0904) BP: (137-183)/(81-98) 170/84 (10/11 1503) SpO2:  [99 %-100 %] 100 % (10/11 1321)  Weight change:  Filed Weights   04/14/17 1422  Weight: 105 kg (231 lb 6.4 oz)    Intake/Output: I/O last 3 completed shifts: In: 1846.1 [P.O.:1680; I.V.:66.1; IV Piggyback:100] Out: 12250 [Urine:12250]   Intake/Output this shift:  Total I/O In: 480 [P.O.:480] Out: 4300 [Urine:4300]  Physical Exam: General: No acute distress  Head: Normocephalic, atraumatic. Moist oral mucosal membranes  Eyes: Mild periorbital edema  Neck: Supple, trachea midline  Lungs:  Clear to auscultation, normal effort  Heart: S1S2 no rubs  Abdomen:  Soft, nontender, bowel sounds present  Extremities: 2+ edema LLE, R AKA, 1+ edema in UE's  Neurologic: Awake, alert, following commands  Skin: Skin wrinking noted  GU:  Penile and scrotal edema noted    Basic Metabolic Panel:  Recent Labs Lab 04/14/17 1123 04/15/17 0254 04/16/17 0654 04/16/17 1848 04/17/17 0534  NA 134* 137 136  --  140  K 3.1* 3.0* 3.4* 3.9 3.4*  CL 101 102 102  --  101  CO2 28 28 29   --  32  GLUCOSE 349* 109* 147*  --  100*  BUN 24* 21* 21*  --  27*  CREATININE 1.26* 1.27* 1.13  --  1.23  CALCIUM 7.9* 7.8* 7.6*  --  8.3*  MG 1.9  --   --   --  1.7    Liver Function Tests:  Recent Labs Lab 04/14/17 1123 04/16/17 1848  AST 46*  --   ALT 31  --   ALKPHOS 142*  --   BILITOT 0.4  --   PROT 5.4*  --   ALBUMIN 1.3* 1.8*   No results for input(s): LIPASE, AMYLASE in the last 168 hours. No results for input(s): AMMONIA in the last 168  hours.  CBC:  Recent Labs Lab 04/14/17 1123 04/15/17 0254 04/16/17 0654  WBC 5.9 5.6 4.5  NEUTROABS 4.0  --   --   HGB 10.2* 9.3* 9.3*  HCT 30.4* 27.6* 26.9*  MCV 78.3* 77.5* 78.3*  PLT 320 314 292    Cardiac Enzymes:  Recent Labs Lab 04/14/17 1123  TROPONINI 0.04*  0.04*    BNP: Invalid input(s): POCBNP  CBG:  Recent Labs Lab 04/16/17 1154 04/16/17 1635 04/16/17 2019 04/17/17 0726 04/17/17 1128  GLUCAP 98 154* 161* 27 212*    Microbiology: Results for orders placed or performed during the hospital encounter of 04/14/17  Aerobic/Anaerobic Culture (surgical/deep wound)     Status: None (Preliminary result)   Collection Time: 04/15/17  1:46 PM  Result Value Ref Range Status   Specimen Description LEG RIGHT LEG  Final   Special Requests NONE  Final   Gram Stain   Final    RARE WBC PRESENT, PREDOMINANTLY PMN MODERATE GRAM POSITIVE COCCI IN PAIRS IN CLUSTERS RARE GRAM NEGATIVE DIPLOCOCCI Performed at [facility], [place] [address], [place], [place] [address]    Culture MODERATE GRAM NEGATIVE RODS  Final   Report Status PENDING  Incomplete    Coagulation  Studies: No results for input(s): LABPROT, INR in the last 72 hours.  Urinalysis: No results for input(s): COLORURINE, LABSPEC, PHURINE, GLUCOSEU, HGBUR, BILIRUBINUR, KETONESUR, PROTEINUR, UROBILINOGEN, NITRITE, LEUKOCYTESUR in the last 72 hours.  Invalid input(s): APPERANCEUR    Imaging: No results found.   Medications:   . albumin human Stopped (04/17/17 1510)  . furosemide (LASIX) infusion 8 mg/hr (04/17/17 1510)   . aspirin EC  325 mg Oral Daily  . atorvastatin  40 mg Oral q1800  . enoxaparin (LOVENOX) injection  40 mg Subcutaneous Q24H  . Influenza vac split quadrivalent PF  0.5 mL Intramuscular Tomorrow-1000  . insulin aspart  0-5 Units Subcutaneous QHS  . insulin aspart  0-9 Units Subcutaneous TID WC  . insulin aspart  3 Units Subcutaneous TID AC  . insulin glargine  25 Units  Subcutaneous QHS  . lisinopril  10 mg Oral Daily  . mupirocin cream   Topical BID   acetaminophen **OR** acetaminophen, albuterol, bisacodyl, hydrALAZINE, HYDROcodone-acetaminophen, ondansetron **OR** ondansetron (ZOFRAN) IV, senna-docusate  Assessment/ Plan:  31 y.o. male  with a PMHx of Diabetes mellitus type 1, hypertension, prior episodes of DKA, history of non-ST elevation myocardial infarction, peripheral vascular disease with right above-the-knee amputation, hyperlipidemia who was admitted to [facility] on 04/14/2017 for evaluation of severe anasarca.   1.  Generalized edema due to nephrotic syndrome. 2.  Nephrotic syndrome/proteinuria likely due to Diabetes Mellitus.  Negative GBM/HIV/ANCA/GBM/hepatitis screen/ANA/SPEP/UPEP 3.  CKD stage II 4.  Diabetes mellitus type I with CKD.  5.  Hypertension.  Plan:  Overall the patient was found to have excellent diuresis yesterday.  His urine output was 9.8 L and he was net negative 8.3 liters.  We will reduce Lasix drip to 6 mg per hour.  Also continue albumin 25 g IV every 8 hours for now.  Serologic workup found to be negative thus far.  We will consider transitioning the patient to by mouth diuretics in the next day or 2.  Overall he is feeling much better.   LOS: 3 Manasvini Whatley 10/11/20183:24 PM

## 2017-04-18 LAB — MAGNESIUM: Magnesium: 1.7 mg/dL (ref 1.7–2.4)

## 2017-04-18 LAB — GLUCOSE, CAPILLARY
Glucose-Capillary: 144 mg/dL — ABNORMAL HIGH (ref 65–99)
Glucose-Capillary: 172 mg/dL — ABNORMAL HIGH (ref 65–99)

## 2017-04-18 LAB — BASIC METABOLIC PANEL
Anion gap: 8 (ref 5–15)
BUN: 28 mg/dL — AB (ref 6–20)
CHLORIDE: 97 mmol/L — AB (ref 101–111)
CO2: 33 mmol/L — AB (ref 22–32)
CREATININE: 1.35 mg/dL — AB (ref 0.61–1.24)
Calcium: 8.6 mg/dL — ABNORMAL LOW (ref 8.9–10.3)
GFR calc Af Amer: >60 mL/min (ref >60)
GFR calc non Af Amer: >60 mL/min (ref >60)
GLUCOSE: 194 mg/dL — AB (ref 65–99)
POTASSIUM: 3.3 mmol/L — AB (ref 3.5–5.1)
SODIUM: 138 mmol/L (ref 135–145)

## 2017-04-18 MED ORDER — ASPIRIN EC 81 MG PO TBEC: 81.0000 mg | DELAYED_RELEASE_TABLET | Freq: Every day | ORAL | 2 refills | Status: DC

## 2017-04-18 MED ORDER — INSULIN GLARGINE 100 UNIT/ML ~~LOC~~ SOLN: 25.0000 [IU] | Freq: Every day | SUBCUTANEOUS | 0 refills | Status: DC

## 2017-04-18 MED ORDER — INSULIN ASPART 100 UNIT/ML ~~LOC~~ SOLN: 5.0000 [IU] | Freq: Three times a day (TID) | SUBCUTANEOUS | 0 refills | Status: DC

## 2017-04-18 MED ORDER — POTASSIUM CHLORIDE CRYS ER 20 MEQ PO TBCR: 40.0000 meq | EXTENDED_RELEASE_TABLET | Freq: Two times a day (BID) | ORAL | 0 refills | Status: DC

## 2017-04-18 MED ORDER — POTASSIUM CHLORIDE CRYS ER 20 MEQ PO TBCR
40.0000 meq | EXTENDED_RELEASE_TABLET | Freq: Two times a day (BID) | ORAL | Status: DC
  Administered 2017-04-18: 40 meq via ORAL
  Filled 2017-04-18: qty 2

## 2017-04-18 MED ORDER — ATORVASTATIN CALCIUM 40 MG PO TABS: 40.0000 mg | ORAL_TABLET | Freq: Every day | ORAL | 0 refills | Status: DC

## 2017-04-18 MED ORDER — SULFAMETHOXAZOLE-TRIMETHOPRIM 800-160 MG PO TABS: 1.0000 | ORAL_TABLET | Freq: Two times a day (BID) | ORAL | 0 refills | Status: DC

## 2017-04-18 MED ORDER — POTASSIUM CHLORIDE CRYS ER 20 MEQ PO TBCR: 40.0000 meq | EXTENDED_RELEASE_TABLET | Freq: Every day | ORAL | 0 refills | Status: DC

## 2017-04-18 MED ORDER — HYDROCODONE-ACETAMINOPHEN 5-325 MG PO TABS: 1.0000 | ORAL_TABLET | Freq: Four times a day (QID) | ORAL | 0 refills | Status: DC | PRN

## 2017-04-18 MED ORDER — FUROSEMIDE 40 MG PO TABS
80.0000 mg | ORAL_TABLET | Freq: Two times a day (BID) | ORAL | Status: DC
  Administered 2017-04-18: 10:00:00 80 mg via ORAL
  Filled 2017-04-18: qty 2

## 2017-04-18 MED ORDER — HYDRALAZINE HCL 50 MG PO TABS
25.0000 mg | ORAL_TABLET | Freq: Three times a day (TID) | ORAL | Status: DC
  Administered 2017-04-18: 16:00:00 25 mg via ORAL
  Filled 2017-04-18: qty 1

## 2017-04-18 MED ORDER — POTASSIUM CHLORIDE 20 MEQ PO PACK
40.0000 meq | PACK | Freq: Once | ORAL | Status: AC
  Administered 2017-04-18: 40 meq via ORAL
  Filled 2017-04-18: qty 2

## 2017-04-18 MED ORDER — MUPIROCIN CALCIUM 2 % EX CREA: TOPICAL_CREAM | Freq: Two times a day (BID) | CUTANEOUS | 0 refills | Status: DC

## 2017-04-18 MED ORDER — HYDRALAZINE HCL 25 MG PO TABS: 25.0000 mg | ORAL_TABLET | Freq: Three times a day (TID) | ORAL | 0 refills | Status: DC

## 2017-04-18 MED ORDER — FUROSEMIDE 80 MG PO TABS: 80.0000 mg | ORAL_TABLET | Freq: Two times a day (BID) | ORAL | 0 refills | Status: DC

## 2017-04-18 MED ORDER — LISINOPRIL 20 MG PO TABS: 20.0000 mg | ORAL_TABLET | Freq: Every day | ORAL | Status: DC

## 2017-04-18 MED ORDER — LISINOPRIL 20 MG PO TABS: 20.0000 mg | ORAL_TABLET | Freq: Every day | ORAL | 0 refills | Status: DC

## 2017-04-18 MED ORDER — SENNOSIDES-DOCUSATE SODIUM 8.6-50 MG PO TABS: 1.0000 | ORAL_TABLET | Freq: Every evening | ORAL | Status: DC | PRN

## 2017-04-18 NOTE — Progress Notes (Signed)
[facility]  ROUNDING NOTE   Subjective:  Patient has had almost 30 pound weight loss since diuresis. Patient feels much better. Significant improvement in status.  Objective:  Vital signs in last 24 hours:  Temp:  [98.6 F (37 C)-98.7 F (37.1 C)] 98.7 F (37.1 C) (10/12 0454) Pulse Rate:  [91-107] 91 (10/12 0815) Resp:  [18-20] 18 (10/12 0815) BP: (155-172)/(70-95) 160/70 (10/12 0815) SpO2:  [96 %-100 %] 100 % (10/12 0815) Weight:  [91.3 kg (201 lb 3 oz)] 91.3 kg (201 lb 3 oz) (10/11 2048)  Weight change:  Filed Weights   04/14/17 1422 04/17/17 2048  Weight: 105 kg (231 lb 6.4 oz) 91.3 kg (201 lb 3 oz)    Intake/Output: I/O last 3 completed shifts: In: 480 [P.O.:480] Out: 95093 [Urine:15650]   Intake/Output this shift:  Total I/O In: -  Out: 500 [Urine:500]  Physical Exam: General: No acute distress  Head: Normocephalic, atraumatic. Moist oral mucosal membranes  Eyes: anicteric  Neck: Supple, trachea midline  Lungs:  Clear to auscultation, normal effort  Heart: S1S2 no rubs  Abdomen:  Soft, nontender, bowel sounds present  Extremities: 1+ edema LLE, R AKA, trace edema in UE's  Neurologic: Awake, alert, following commands  Skin: Skin wrinking noted  GU:  Penile and scrotal edema noted    Basic Metabolic Panel:  Recent Labs Lab 04/14/17 1123 04/15/17 0254 04/16/17 0654 04/16/17 1848 04/17/17 0534 04/18/17 0509  NA 134* 137 136  --  140 138  K 3.1* 3.0* 3.4* 3.9 3.4* 3.3*  CL 101 102 102  --  101 97*  CO2 28 28 29   --  32 33*  GLUCOSE 349* 109* 147*  --  100* 194*  BUN 24* 21* 21*  --  27* 28*  CREATININE 1.26* 1.27* 1.13  --  1.23 1.35*  CALCIUM 7.9* 7.8* 7.6*  --  8.3* 8.6*  MG 1.9  --   --   --  1.7 1.7    Liver Function Tests:  Recent Labs Lab 04/14/17 1123 04/16/17 1848  AST 46*  --   ALT 31  --   ALKPHOS 142*  --   BILITOT 0.4  --   PROT 5.4*  --   ALBUMIN 1.3* 1.8*   No results for input(s): LIPASE, AMYLASE in the  last 168 hours. No results for input(s): AMMONIA in the last 168 hours.  CBC:  Recent Labs Lab 04/14/17 1123 04/15/17 0254 04/16/17 0654  WBC 5.9 5.6 4.5  NEUTROABS 4.0  --   --   HGB 10.2* 9.3* 9.3*  HCT 30.4* 27.6* 26.9*  MCV 78.3* 77.5* 78.3*  PLT 320 314 292    Cardiac Enzymes:  Recent Labs Lab 04/14/17 1123  TROPONINI 0.04*  0.04*    BNP: Invalid input(s): POCBNP  CBG:  Recent Labs Lab 04/17/17 1640 04/17/17 1701 04/17/17 2143 04/18/17 0752 04/18/17 1139  GLUCAP 302* 288* 227* 144* 172*    Microbiology: Results for orders placed or performed during the hospital encounter of 04/14/17  Aerobic/Anaerobic Culture (surgical/deep wound)     Status: None (Preliminary result)   Collection Time: 04/15/17  1:46 PM  Result Value Ref Range Status   Specimen Description LEG RIGHT LEG  Final   Special Requests NONE  Final   Gram Stain   Final    RARE WBC PRESENT, PREDOMINANTLY PMN MODERATE GRAM POSITIVE COCCI IN PAIRS IN CLUSTERS RARE GRAM NEGATIVE DIPLOCOCCI    Culture   Final    MODERATE  ACINETOBACTER CALCOACETICUS/BAUMANNII COMPLEX ABUNDANT STAPHYLOCOCCUS AUREUS CULTURE REINCUBATED FOR BETTER GROWTH Performed at [facility], [place] [address], [place], [place] [address]    Report Status PENDING  Incomplete   Organism ID, Bacteria ACINETOBACTER CALCOACETICUS/BAUMANNII COMPLEX  Final      Susceptibility   Acinetobacter calcoaceticus/baumannii complex - MIC*    CEFTAZIDIME 8 SENSITIVE Sensitive     CEFTRIAXONE 16 INTERMEDIATE Intermediate     CIPROFLOXACIN <=0.25 SENSITIVE Sensitive     GENTAMICIN <=1 SENSITIVE Sensitive     IMIPENEM <=0.25 SENSITIVE Sensitive     PIP/TAZO <=4 SENSITIVE Sensitive     TRIMETH/SULFA <=20 SENSITIVE Sensitive     CEFEPIME 4 SENSITIVE Sensitive     AMPICILLIN/SULBACTAM <=2 SENSITIVE Sensitive     * MODERATE ACINETOBACTER CALCOACETICUS/BAUMANNII COMPLEX    Coagulation Studies: No results for input(s): LABPROT, INR in  the last 72 hours.  Urinalysis: No results for input(s): COLORURINE, LABSPEC, PHURINE, GLUCOSEU, HGBUR, BILIRUBINUR, KETONESUR, PROTEINUR, UROBILINOGEN, NITRITE, LEUKOCYTESUR in the last 72 hours.  Invalid input(s): APPERANCEUR    Imaging: No results found.   Medications:    . aspirin EC  325 mg Oral Daily  . atorvastatin  40 mg Oral q1800  . enoxaparin (LOVENOX) injection  40 mg Subcutaneous Q24H  . furosemide  80 mg Oral BID  . Influenza vac split quadrivalent PF  0.5 mL Intramuscular Tomorrow-1000  . insulin aspart  0-5 Units Subcutaneous QHS  . insulin aspart  0-9 Units Subcutaneous TID WC  . insulin aspart  3 Units Subcutaneous TID AC  . insulin glargine  25 Units Subcutaneous QHS  . lisinopril  10 mg Oral Daily  . mupirocin cream   Topical BID  . potassium chloride  40 mEq Oral BID   acetaminophen **OR** acetaminophen, albuterol, bisacodyl, hydrALAZINE, HYDROcodone-acetaminophen, ondansetron **OR** ondansetron (ZOFRAN) IV, senna-docusate  Assessment/ Plan:  31 y.o. male  with a PMHx of Diabetes mellitus type 1, hypertension, prior episodes of DKA, history of non-ST elevation myocardial infarction, peripheral vascular disease with right above-the-knee amputation, hyperlipidemia who was admitted to [facility] on 04/14/2017 for evaluation of severe anasarca.   1.  Generalized edema due to nephrotic syndrome. 2.  Nephrotic syndrome/proteinuria likely due to Diabetes Mellitus.  Negative GBM/HIV/ANCA/GBM/hepatitis screen/ANA/SPEP/UPEP 3.  CKD stage II 4.  Diabetes mellitus type I with CKD.  5.  Hypertension.  Plan:  atient has done remarkably well with diuresis. He has experienced an almost 30 pound weight loss since admission. Patient reports feeling significantly better. We will transition the patient to Lasix 80 mg by mouth twice a day.I plan to see him back in the office in the next week or 2. Thank you for allowing [place] to participate in the patient's care.   LOS: 4 [name],  [name] 10/12/20181:27 PM

## 2017-04-18 NOTE — Progress Notes (Signed)
Inpatient Diabetes Program Recommendations  AACE/ADA: New Consensus Statement on Inpatient Glycemic Control (2015)  Target Ranges:  Prepandial:   less than 140 mg/dL      Peak postprandial:   less than 180 mg/dL (1-2 hours)      Critically ill patients:  140 - 180 mg/dL   Lab Results  Component Value Date   GLUCAP 144 (H) 04/18/2017   HGBA1C 14.1 (H) 04/16/2017    Review of Glycemic Control  Results for DAMON, HARGROVE (MRN [id number]) as of 04/18/2017 08:52  Ref. Range 04/17/2017 11:28 04/17/2017 16:40 04/17/2017 17:01 04/17/2017 21:43 04/18/2017 07:52  Glucose-Capillary Latest Ref Range: 65 - 99 mg/dL 865 (H) 784 (H) 696 (H) 227 (H) 144 (H)   Home DM Meds: Levemir 30 units QHS Novolog 10 units TID with meals  Current Insulin Orders: Lantus 25 units QHS Novolog Sensitive Correction Scale/ SSI (0-9 units) TID AC + HS Novolog 3 units TID with meals   Consider increasing Novolog mealtime to Novolog 5 units tid (post prandial numbers still elevated)- continue Novolog correction (0-9 units) as ordered.  Susette Racer, RN, BA, MHA, CDE Diabetes Coordinator Inpatient Diabetes Program  [phone] (Team Pager) [phone] [facility] Office) 04/18/2017 8:55 AM

## 2017-04-18 NOTE — Care Management Note (Signed)
Case Management Note  Patient Details  Name: [name] MRN: [id number] Date of Birth: 1986/07/06  Subjective/Objective:  Admitted to [facility] with the diagnosis of anasarca. Lives alone. Brother is [place] [phone]). Last seen Dr, Hessie Diener 08/26/16. Goes to the [facility]? (patient sleeping).  [facility] information given 09/11/16. Used MATCH program 05/2016. Rolling Walkers x 2 and cane in the home. Takes care of all basic activities of daily living himself.   Medicaid potential is listed as insurance              Action/Plan: Unsure of discharge plans at this time   Expected Discharge Date:  04/16/17               Expected Discharge Plan:     In-House Referral:     Discharge planning Services     Post Acute Care Choice:    Choice offered to:     DME Arranged:    DME Agency:     HH Arranged:    HH Agency:     Status of Service:     If discussed at [employer], dates discussed:    Additional Comments:  Gwenette Greet, RN MSN CCM Care Management [phone] 04/18/2017, 9:40 AM

## 2017-04-18 NOTE — Progress Notes (Signed)
MEDICATION RELATED CONSULT NOTE - FOLLOW UP    Pharmacy Consult for electrolyte replacement per protocol  Indication: hypokalemia, poss hypocalcemia (no albumin resulted yet).   No Known Allergies  Patient Measurements: Height:  (170.2 cm) Weight: 201 lb 3 oz (91.3 kg) IBW/kg (Calculated) : 66.1   Vital Signs: Temp: 98.7 F (37.1 C) (10/12 0454) Temp Source: Oral (10/12 0454) BP: 160/70 (10/12 0815) Pulse Rate: 91 (10/12 0815) Intake/Output from previous day: 10/11 0701 - 10/12 0700 In: 480 [P.O.:480] Out: 9800 [Urine:9800] Intake/Output from this shift: Total I/O In: -  Out: 500 [Urine:500]  Labs:  Recent Labs  04/15/17 1630 04/16/17 0654 04/16/17 1848 04/17/17 0534 04/18/17 0509  WBC  --  4.5  --   --   --   HGB  --  9.3*  --   --   --   HCT  --  26.9*  --   --   --   PLT  --  292  --   --   --   CREATININE  --  1.13  --  1.23 1.35*  LABCREA 75  --   --   --   --   MG  --   --   --  1.7 1.7  ALBUMIN  --   --  1.8*  --   --    Estimated Creatinine Clearance: 83.9 mL/min (A) (by C-G formula based on SCr of 1.35 mg/dL (H)).   Microbiology: Recent Results (from the past 720 hour(s))  Aerobic/Anaerobic Culture (surgical/deep wound)     Status: None (Preliminary result)   Collection Time: 04/15/17  1:46 PM  Result Value Ref Range Status   Specimen Description LEG RIGHT LEG  Final   Special Requests NONE  Final   Gram Stain   Final    RARE WBC PRESENT, PREDOMINANTLY PMN MODERATE GRAM POSITIVE COCCI IN PAIRS IN CLUSTERS RARE GRAM NEGATIVE DIPLOCOCCI    Culture   Final    MODERATE ACINETOBACTER CALCOACETICUS/BAUMANNII COMPLEX ABUNDANT STAPHYLOCOCCUS AUREUS CULTURE REINCUBATED FOR BETTER GROWTH Performed at [facility], [address] [place], [place] [address]    Report Status PENDING  Incomplete   Organism ID, Bacteria ACINETOBACTER CALCOACETICUS/BAUMANNII COMPLEX  Final      Susceptibility   Acinetobacter calcoaceticus/baumannii complex -  MIC*    CEFTAZIDIME 8 SENSITIVE Sensitive     CEFTRIAXONE 16 INTERMEDIATE Intermediate     CIPROFLOXACIN <=0.25 SENSITIVE Sensitive     GENTAMICIN <=1 SENSITIVE Sensitive     IMIPENEM <=0.25 SENSITIVE Sensitive     PIP/TAZO <=4 SENSITIVE Sensitive     TRIMETH/SULFA <=20 SENSITIVE Sensitive     CEFEPIME 4 SENSITIVE Sensitive     AMPICILLIN/SULBACTAM <=2 SENSITIVE Sensitive     * MODERATE ACINETOBACTER CALCOACETICUS/BAUMANNII COMPLEX    Medical History: Past Medical History:  Diagnosis Date  . Abscess of groin, right 09/09/2016  . 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 Acute gastroenteritis 09/11/2016  . AKI (acute kidney injury) (HCC) 02/19/2013  . Anemia 12/14/2015  . Dehydration 09/11/2016  . Diabetes mellitus without complication (HCC)   . Diabetic acidosis without coma (HCC) 06/01/2016  . Elevated bilirubin 09/11/2016  . Essential hypertension with goal blood pressure less than 130/80 02/07/2015  . Hypercholesterolemia 02/25/2013  . Hyponatremia 09/11/2016  . Hypophosphatemia 12/14/2015  . Hypovitaminosis D 12/16/2015  . Mass of right inguinal region   . Microcytic anemia 11/17/2014  . Non-ST elevation myocardial infarction (NSTEMI) (HCC) 02/19/2013  .  Poorly controlled type 1 diabetes mellitus (HCC) 03/03/2012   Overview:  Diagnosed 02/2012.  Now insulin-dependent  . Proteinuria 12/14/2015    Medications:  Prescriptions Prior to Admission  Medication Sig Dispense Refill Last Dose  . furosemide (LASIX) 40 MG tablet Take 1 tablet (40 mg total) by mouth daily. 7 tablet 0 04/13/2017 at 0800  . insulin aspart (NOVOLOG) 100 UNIT/ML injection Inject 20 Units into the skin 3 (three) times daily before meals. (Patient taking differently: Inject 10 Units into the skin 3 (three) times daily before meals. ) 10 mL 11 04/13/2017 at 0800  . Insulin Detemir (LEVEMIR FLEXPEN [place]) Inject 30 Units into the skin at bedtime.   04/13/2017 at 2100  . amoxicillin-clavulanate (AUGMENTIN)  875-125 MG tablet Take 1 tablet by mouth 2 (two) times daily. (Patient not taking: Reported on 04/14/2017) 20 tablet 0 Not Taking at Unknown time  . insulin glargine (LANTUS) 100 UNIT/ML injection Inject 0.1 mLs (10 Units total) into the skin 2 (two) times daily. (Patient not taking: Reported on 04/14/2017) 10 mL 11 Not Taking at 0800   Scheduled:  . aspirin EC  325 mg Oral Daily  . atorvastatin  40 mg Oral q1800  . enoxaparin (LOVENOX) injection  40 mg Subcutaneous Q24H  . furosemide  80 mg Oral BID  . Influenza vac split quadrivalent PF  0.5 mL Intramuscular Tomorrow-1000  . insulin aspart  0-5 Units Subcutaneous QHS  . insulin aspart  0-9 Units Subcutaneous TID WC  . insulin aspart  3 Units Subcutaneous TID AC  . insulin glargine  25 Units Subcutaneous QHS  . lisinopril  10 mg Oral Daily  . mupirocin cream   Topical BID   Infusions:   PRN: acetaminophen **OR** acetaminophen, albuterol, bisacodyl, hydrALAZINE, HYDROcodone-acetaminophen, ondansetron **OR** ondansetron (ZOFRAN) IV, senna-docusate Anti-infectives    None      Assessment: Hypokalemia; K+ 3.3. Patient currently on Lasix 80 mg  Goal of Therapy:  Electrolytes WNL  Plan: Will start KCl 40 mEq PO BID. Will recheck on 10/13 am.    Oluwaseyi Tull D 04/18/2017,10:15 AM

## 2017-04-18 NOTE — Discharge Instructions (Signed)
Follow-up with primary care physician in 5 days Follow-up with nephrology Dr. Cherylann Ratel in a week

## 2017-04-18 NOTE — Progress Notes (Signed)
Discharge paperwork reviewed with patient who verbalized understanding. New prescriptions attached and educated. Patient blood pressure has come down and pt started on hydralazine this afternoon. Pt blood sugar stable- pt understands new insulin dose changes. Pt friend to transport home.

## 2017-04-18 NOTE — Discharge Summary (Signed)
[facility] NAME: [name]    MR#:  [id number]  DATE OF BIRTH:  Apr 13, 1986  DATE OF ADMISSION:  04/14/2017 ADMITTING PHYSICIAN: Demetrios Loll, MD  DATE OF DISCHARGE: 04/18/17  PRIMARY CARE PHYSICIAN: Letta Median, MD    ADMISSION DIAGNOSIS:  Anasarca [R60.1] Elevated troponin I level [R74.8]  DISCHARGE DIAGNOSIS:  Active Problems:   Anasarca  Nephrotic syndrome  SECONDARY DIAGNOSIS:   Past Medical History:  Diagnosis Date  . Abscess of groin, right 09/09/2016  . 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 Acute gastroenteritis 09/11/2016  . AKI (acute kidney injury) ([place]) 02/19/2013  . Anemia 12/14/2015  . Dehydration 09/11/2016  . Diabetes mellitus without complication ([place])   . Diabetic acidosis without coma ([place]) 06/01/2016  . Elevated bilirubin 09/11/2016  . Essential hypertension with goal blood pressure less than 130/80 02/07/2015  . Hypercholesterolemia 02/25/2013  . Hyponatremia 09/11/2016  . Hypophosphatemia 12/14/2015  . Hypovitaminosis D 12/16/2015  . Mass of right inguinal region   . Microcytic anemia 11/17/2014  . Non-ST elevation myocardial infarction (NSTEMI) ([place]) 02/19/2013  . Poorly controlled type 1 diabetes mellitus ([place]) 03/03/2012   Overview:  Diagnosed 02/2012.  Now insulin-dependent  . Proteinuria 12/14/2015    HOSPITAL COURSE:  hpi [name]  is a 31 y.o. male with a known history of Hypertension, diabetes type 1, acute renal failure, DKA, non-STEMI and anemia. The patient presents in the ED with the above chief complaints. He started to have leg edema and then increased to abdominal wall and the scrotal edema. He was started Lasix one week ago but the diffuse body swelling has been worsening, he also feels facial swelling for the past few days. He also complains of weight gain and shortness of breath on exertion. Dr. Holley Raring suspect this patient has nephrotic syndrome  and suggested admitting the patient  Anasarca, 2/2 nephrotic syndrome. 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 Discharge with Lasix 80 mg by mouth twice a day IV albumin given during the hospital course Negative GBM ,HIV screen, hepatitis screen Follow-up with nephrology outpatient-  Left lower extremity ulcer   wound culture and sensitivities growing gram-positive cocci in clusters and pairs; reveals Staphylococcus aureus and actinobacter sensitive to Bactrim . Discharge patient with by mouth Bactrim and PCP to follow up on the final culture result  Chronic kidney disease stage II  Avoid nephrotoxins and follow up with nephrology. Monitor renal function  DM -ISS Nighttime snack advised as patient is becoming hypoglycemic in the early hours Lantus dose decreased to 25 units daily at bedtime; NovoLog 5 units 3 times a day   Hypokalemia. Give potassium supplement,   Elevated troponin.  Patient denies any chest pain today Troponins 0.042  Started aspirin and Lipitor.  Hyperlipidemia in the setting of nephrotic syndrome LDL 347 start patient on statin  Hypertension. Continue Lasix and lisinopril,as well as L dose increased to 20 mg and hydralazine is added to the regimen Titrate as needed   Diabetes. Continue Levemir 25 units and NovoLog 5 units 3 times a day with meals Needs tight control of the sugar. PCP to follow-up   Anemia of chronic disease. Stable.       DISCHARGE CONDITIONS:  stable  CONSULTS OBTAINED:  Treatment Team:  Anthonette Legato, MD   PROCEDURES none  DRUG ALLERGIES:  No Known Allergies  DISCHARGE MEDICATIONS:   Current Discharge Medication List    START taking these medications   Details  aspirin EC 81 MG tablet Take 1 tablet (81 mg total) by mouth daily. Qty: 150 tablet, Refills: 2    atorvastatin (LIPITOR)  40 MG tablet Take 1 tablet (40 mg total) by mouth daily at 6 PM. Qty: 30 tablet, Refills: 0    hydrALAZINE (APRESOLINE) 25 MG tablet Take 1 tablet (25 mg total) by mouth every 8 (eight) hours. Qty: 90 tablet, Refills: 0    HYDROcodone-acetaminophen (NORCO/VICODIN) 5-325 MG tablet Take 1 tablet by mouth every 6 (six) hours as needed for moderate pain. Qty: 30 tablet, Refills: 0    lisinopril (PRINIVIL,ZESTRIL) 20 MG tablet Take 1 tablet (20 mg total) by mouth daily. Qty: 30 tablet, Refills: 0    mupirocin cream (BACTROBAN) 2 % Apply topically 2 (two) times daily. Qty: 15 g, Refills: 0    potassium chloride SA (K-DUR,KLOR-CON) 20 MEQ tablet Take 2 tablets (40 mEq total) by mouth 2 (two) times daily. Qty: 60 tablet, Refills: 0    senna-docusate (SENOKOT-S) 8.6-50 MG tablet Take 1 tablet by mouth at bedtime as needed for mild constipation.    sulfamethoxazole-trimethoprim (BACTRIM DS,SEPTRA DS) 800-160 MG tablet Take 1 tablet by mouth 2 (two) times daily. Qty: 20 tablet, Refills: 0      CONTINUE these medications which have CHANGED   Details  furosemide (LASIX) 80 MG tablet Take 1 tablet (80 mg total) by mouth 2 (two) times daily. Qty: 60 tablet, Refills: 0    insulin aspart (NOVOLOG) 100 UNIT/ML injection Inject 5 Units into the skin 3 (three) times daily before meals. Qty: 100 mL, Refills: 0    insulin glargine (LANTUS) 100 UNIT/ML injection Inject 0.25 mLs (25 Units total) into the skin at bedtime. Qty: 100 mL, Refills: 0      STOP taking these medications     Insulin Detemir (LEVEMIR FLEXPEN [place])      amoxicillin-clavulanate (AUGMENTIN) 875-125 MG tablet          DISCHARGE INSTRUCTIONS:   Follow-up with primary care physician in 5 days Follow-up with nephrology Dr. Holley Raring in a week   DIET:  Renal diet, restrict fluids to 1 L per day   DISCHARGE CONDITION:  Stable  ACTIVITY:  Activity as tolerated  OXYGEN:  Home Oxygen: No.   Oxygen Delivery: room  air  DISCHARGE LOCATION:  home   If you experience worsening of your admission symptoms, develop shortness of breath, life threatening emergency, suicidal or homicidal thoughts you must seek medical attention immediately by calling 911 or calling your MD immediately  if symptoms less severe.  You Must read complete instructions/literature along with all the possible adverse reactions/side effects for all the Medicines you take and that have been prescribed to you. Take any new Medicines after you have completely understood and accpet all the possible adverse reactions/side effects.   Please note  You were cared for by a hospitalist during your hospital stay. If you have any questions about your discharge medications or the care you received while you were in the hospital after you are discharged, you can call the unit and asked to speak with the hospitalist on call if the hospitalist that took care of you is not available. Once you are discharged, your primary care physician will handle any further medical issues. Please note that NO REFILLS for any discharge medications will be authorized once you are discharged, as  it is imperative that you return to your primary care physician (or establish a relationship with a primary care physician if you do not have one) for your aftercare needs so that they can reassess your need for medications and monitor your lab values.     Today  Chief Complaint  Patient presents with  . Groin Swelling    Patient is feeling much better. Denies any shortness of breath. Feels comfortable to go home  ROS:  CONSTITUTIONAL: Denies fevers, chills. Denies any fatigue, weakness.  EYES: Denies blurry vision, double vision, eye pain. EARS, NOSE, THROAT: Denies tinnitus, ear pain, hearing loss. RESPIRATORY: Denies cough, wheeze, shortness of breath.  CARDIOVASCULAR: Denies chest pain, palpitations, edema.  GASTROINTESTINAL: Denies nausea, vomiting, diarrhea, abdominal  pain. Denies bright red blood per rectum. GENITOURINARY: Denies dysuria, hematuria. ENDOCRINE: Denies nocturia or thyroid problems. HEMATOLOGIC AND LYMPHATIC: Denies easy bruising or bleeding. SKIN: Denies rash or lesion. MUSCULOSKELETAL: Denies pain in neck, back, shoulder, knees, hips or arthritic symptoms.  NEUROLOGIC: Denies paralysis, paresthesias.  PSYCHIATRIC: Denies anxiety or depressive symptoms.   VITAL SIGNS:  Blood pressure (!) 156/84, pulse (!) 106, temperature 97.9 F (36.6 C), temperature source Oral, resp. rate 16, height  (1.702 m), weight 91.3 kg (201 lb 3 oz), SpO2 100 %.  I/O:    Intake/Output Summary (Last 24 hours) at 04/18/17 1602 Last data filed at 04/18/17 0815  Gross per 24 hour  Intake                0 ml  Output             5000 ml  Net            -5000 ml    PHYSICAL EXAMINATION:  GENERAL:  31 y.o.-year-old patient lying in the bed with no acute distress.  EYES: Pupils equal, round, reactive to light and accommodation. No scleral icterus. Extraocular muscles intact.  HEENT: Head atraumatic, normocephalic. Oropharynx and nasopharynx clear.  NECK:  Supple, no jugular venous distention. No thyroid enlargement, no tenderness.  LUNGS: Normal breath sounds bilaterally, no wheezing, rales,rhonchi or crepitation. No use of accessory muscles of respiration.  CARDIOVASCULAR: S1, S2 normal. No murmurs, rubs, or gallops.  ABDOMEN: Soft, nontender, nondistended. Bowel sounds present. No organomegaly or mass.  EXTREMITIES: has 1+ pedal edema, no cyanosis, or clubbing. Right AKA; left lower extremity with stage II wound, no purulent discharge nontender NEUROLOGIC: Cranial nerves II through XII are intact. Muscle strength 5/5 in all extremities. Sensation intact. Gait not checked.  PSYCHIATRIC: The patient is alert and oriented x 3.  SKIN: Ulcers on left lower extremity  DATA REVIEW:   CBC  Recent Labs Lab 04/16/17 0654  WBC 4.5  HGB 9.3*  HCT 26.9*   PLT 292    Chemistries   Recent Labs Lab 04/14/17 1123  04/18/17 0509  NA 134*  < > 138  K 3.1*  < > 3.3*  CL 101  < > 97*  CO2 28  < > 33*  GLUCOSE 349*  < > 194*  BUN 24*  < > 28*  CREATININE 1.26*  < > 1.35*  CALCIUM 7.9*  < > 8.6*  MG 1.9  < > 1.7  AST 46*  --   --   ALT 31  --   --   ALKPHOS 142*  --   --   BILITOT 0.4  --   --   < > = values in this interval not displayed.  Cardiac Enzymes  Recent Labs Lab 04/14/17 1123  TROPONINI 0.04*  0.04*    Microbiology Results  Results for orders placed or performed during the hospital encounter of 04/14/17  Aerobic/Anaerobic Culture (surgical/deep wound)     Status: None (Preliminary result)   Collection Time: 04/15/17  1:46 PM  Result Value Ref Range Status   Specimen Description LEG RIGHT LEG  Final   Special Requests NONE  Final   Gram Stain   Final    RARE WBC PRESENT, PREDOMINANTLY PMN MODERATE GRAM POSITIVE COCCI IN PAIRS IN CLUSTERS RARE GRAM NEGATIVE DIPLOCOCCI    Culture   Final    MODERATE ACINETOBACTER CALCOACETICUS/BAUMANNII COMPLEX ABUNDANT STAPHYLOCOCCUS AUREUS CULTURE REINCUBATED FOR BETTER GROWTH Performed at [facility], [address] [place], [place] [address]    Report Status PENDING  Incomplete   Organism ID, Bacteria ACINETOBACTER CALCOACETICUS/BAUMANNII COMPLEX  Final      Susceptibility   Acinetobacter calcoaceticus/baumannii complex - MIC*    CEFTAZIDIME 8 SENSITIVE Sensitive     CEFTRIAXONE 16 INTERMEDIATE Intermediate     CIPROFLOXACIN <=0.25 SENSITIVE Sensitive     GENTAMICIN <=1 SENSITIVE Sensitive     IMIPENEM <=0.25 SENSITIVE Sensitive     PIP/TAZO <=4 SENSITIVE Sensitive     TRIMETH/SULFA <=20 SENSITIVE Sensitive     CEFEPIME 4 SENSITIVE Sensitive     AMPICILLIN/SULBACTAM <=2 SENSITIVE Sensitive     * MODERATE ACINETOBACTER CALCOACETICUS/BAUMANNII COMPLEX    RADIOLOGY:  US Renal  Result Date: 04/15/2017 CLINICAL DATA:  Renal insufficiency stage II, anasarca,  hypertension, diabetes. EXAM: RENAL / URINARY TRACT ULTRASOUND COMPLETE COMPARISON:  Renal ultrasound of April 14, 2017 FINDINGS: Right Kidney: Length: 13.5 cm. The renal cortical echotexture remains slightly lower than that of the adjacent liver. There is no hydronephrosis. Left Kidney: Length: 13 cm. The renal cortical echotexture is similar to that on the right. There is no hydronephrosis. Bladder: Appears normal for degree of bladder distention. IMPRESSION: No hydronephrosis. Mildly increased renal cortical echotexture still lower than that of the liver. This likely reflects medical renal disease. Electronically Signed   By: David  [place] M.D.   On: 04/15/2017 13:02    EKG:   Orders placed or performed during the hospital encounter of 04/14/17  . ED EKG  . ED EKG  . EKG 12-Lead  . EKG 12-Lead  . EKG      Management plans discussed with the patient, family and they are in agreement.  CODE STATUS:     Code Status Orders        Start     Ordered   04/14/17 1406  Full code  Continuous     04/14/17 1405    Code Status History    Date Active Date Inactive Code Status Order ID Comments User Context   09/09/2016  7:55 PM 09/11/2016  6:59 PM Full Code [id number]  Altamese Dilling, MD Inpatient   06/01/2016  7:26 PM 06/02/2016  6:35 PM Full Code [id number]  Ramonita Lab, MD Inpatient      TOTAL TIME TAKING CARE OF THIS PATIENT: 45 minutes.   Note: This dictation was prepared with Dragon dictation along with smaller phrase technology. Any transcriptional errors that result from this process are unintentional.   @  on 04/18/2017 at 4:02 PM  Between 7am to 6pm - Pager - [phone]  After 6pm go to [email or web address] - password [email or web address] [facility]  [facility]  Office  [phone]  CC: Primary care physician; Oswaldo Conroy, MD

## 2017-04-20 LAB — AEROBIC/ANAEROBIC CULTURE (SURGICAL/DEEP WOUND)

## 2017-04-20 LAB — AEROBIC/ANAEROBIC CULTURE W GRAM STAIN (SURGICAL/DEEP WOUND)

## 2017-06-09 ENCOUNTER — Other Ambulatory Visit: Payer: Self-pay

## 2017-06-09 ENCOUNTER — Encounter: Payer: Self-pay | Admitting: Emergency Medicine

## 2017-06-09 ENCOUNTER — Inpatient Hospital Stay: Payer: Medicare Other

## 2017-06-09 ENCOUNTER — Inpatient Hospital Stay
Admission: EM | Admit: 2017-06-09 | Discharge: 2017-06-12 | DRG: 699 | Disposition: A | Discharge status: Home / Self Care | Payer: Medicare Other | Attending: Internal Medicine | Admitting: Internal Medicine

## 2017-06-09 DIAGNOSIS — E871 Hypo-osmolality and hyponatremia: Secondary | ICD-10-CM | POA: Diagnosis present

## 2017-06-09 DIAGNOSIS — E785 Hyperlipidemia, unspecified: Secondary | ICD-10-CM | POA: Diagnosis present

## 2017-06-09 DIAGNOSIS — I5041 Acute combined systolic (congestive) and diastolic (congestive) heart failure: Secondary | ICD-10-CM

## 2017-06-09 DIAGNOSIS — Z23 Encounter for immunization: Secondary | ICD-10-CM | POA: Diagnosis present

## 2017-06-09 DIAGNOSIS — E1021 Type 1 diabetes mellitus with diabetic nephropathy: Secondary | ICD-10-CM | POA: Diagnosis not present

## 2017-06-09 DIAGNOSIS — Z89611 Acquired absence of right leg above knee: Secondary | ICD-10-CM

## 2017-06-09 DIAGNOSIS — Z7982 Long term (current) use of aspirin: Secondary | ICD-10-CM

## 2017-06-09 DIAGNOSIS — E8770 Fluid overload, unspecified: Secondary | ICD-10-CM | POA: Diagnosis present

## 2017-06-09 DIAGNOSIS — E876 Hypokalemia: Secondary | ICD-10-CM | POA: Diagnosis present

## 2017-06-09 DIAGNOSIS — E7800 Pure hypercholesterolemia, unspecified: Secondary | ICD-10-CM | POA: Diagnosis present

## 2017-06-09 DIAGNOSIS — E10649 Type 1 diabetes mellitus with hypoglycemia without coma: Secondary | ICD-10-CM | POA: Diagnosis present

## 2017-06-09 DIAGNOSIS — E869 Volume depletion, unspecified: Secondary | ICD-10-CM | POA: Diagnosis present

## 2017-06-09 DIAGNOSIS — R601 Generalized edema: Secondary | ICD-10-CM | POA: Diagnosis not present

## 2017-06-09 DIAGNOSIS — N179 Acute kidney failure, unspecified: Secondary | ICD-10-CM | POA: Diagnosis present

## 2017-06-09 DIAGNOSIS — K2900 Acute gastritis without bleeding: Secondary | ICD-10-CM | POA: Diagnosis present

## 2017-06-09 DIAGNOSIS — I129 Hypertensive chronic kidney disease with stage 1 through stage 4 chronic kidney disease, or unspecified chronic kidney disease: Secondary | ICD-10-CM | POA: Diagnosis present

## 2017-06-09 DIAGNOSIS — N182 Chronic kidney disease, stage 2 (mild): Secondary | ICD-10-CM | POA: Diagnosis present

## 2017-06-09 DIAGNOSIS — Z833 Family history of diabetes mellitus: Secondary | ICD-10-CM

## 2017-06-09 DIAGNOSIS — I5031 Acute diastolic (congestive) heart failure: Secondary | ICD-10-CM

## 2017-06-09 DIAGNOSIS — Z87441 Personal history of nephrotic syndrome: Secondary | ICD-10-CM

## 2017-06-09 DIAGNOSIS — E1022 Type 1 diabetes mellitus with diabetic chronic kidney disease: Secondary | ICD-10-CM | POA: Diagnosis present

## 2017-06-09 DIAGNOSIS — I252 Old myocardial infarction: Secondary | ICD-10-CM

## 2017-06-09 DIAGNOSIS — Z794 Long term (current) use of insulin: Secondary | ICD-10-CM

## 2017-06-09 DIAGNOSIS — E8809 Other disorders of plasma-protein metabolism, not elsewhere classified: Secondary | ICD-10-CM

## 2017-06-09 DIAGNOSIS — R739 Hyperglycemia, unspecified: Secondary | ICD-10-CM

## 2017-06-09 LAB — BLOOD GAS, VENOUS
ACID-BASE DEFICIT: 4.7 mmol/L — AB (ref 0.0–2.0)
BICARBONATE: 22.1 mmol/L (ref 20.0–28.0)
O2 SAT: 59.1 %
PATIENT TEMPERATURE: 37
PCO2 VEN: 46 mmHg (ref 44.0–60.0)
PO2 VEN: 35 mmHg (ref 32.0–45.0)
pH, Ven: 7.29 (ref 7.250–7.430)

## 2017-06-09 LAB — HEPATIC FUNCTION PANEL
ALBUMIN: 1.1 g/dL — AB (ref 3.5–5.0)
ALT: 19 U/L (ref 17–63)
AST: 25 U/L (ref 15–41)
Alkaline Phosphatase: 133 U/L — ABNORMAL HIGH (ref 38–126)
Total Bilirubin: 1 mg/dL (ref 0.3–1.2)
Total Protein: 5.3 g/dL — ABNORMAL LOW (ref 6.5–8.1)

## 2017-06-09 LAB — GLUCOSE, CAPILLARY
GLUCOSE-CAPILLARY: 432 mg/dL — AB (ref 65–99)
GLUCOSE-CAPILLARY: 275 mg/dL — AB (ref 65–99)
Glucose-Capillary: 582 mg/dL (ref 65–99)

## 2017-06-09 LAB — CBC
HCT: 27.4 % — ABNORMAL LOW (ref 40.0–52.0)
Hemoglobin: 8.8 g/dL — ABNORMAL LOW (ref 13.0–18.0)
MCH: 26.3 pg (ref 26.0–34.0)
MCHC: 32.2 g/dL (ref 32.0–36.0)
MCV: 81.5 fL (ref 80.0–100.0)
PLATELETS: 284 10*3/uL (ref 150–440)
RBC: 3.36 MIL/uL — AB (ref 4.40–5.90)
RDW: 14.6 % — AB (ref 11.5–14.5)
WBC: 8.3 10*3/uL (ref 3.8–10.6)

## 2017-06-09 LAB — URINALYSIS, COMPLETE (UACMP) WITH MICROSCOPIC
Bacteria, UA: NONE SEEN
Bilirubin Urine: NEGATIVE
KETONES UR: 20 mg/dL — AB
LEUKOCYTES UA: NEGATIVE
Nitrite: NEGATIVE
PH: 6 (ref 5.0–8.0)
Protein, ur: 100 mg/dL — AB
SPECIFIC GRAVITY, URINE: 1.016 (ref 1.005–1.030)

## 2017-06-09 LAB — BASIC METABOLIC PANEL
Anion gap: 14 (ref 5–15)
BUN: 27 mg/dL — AB (ref 6–20)
CALCIUM: 8 mg/dL — AB (ref 8.9–10.3)
CO2: 21 mmol/L — ABNORMAL LOW (ref 22–32)
CREATININE: 1.69 mg/dL — AB (ref 0.61–1.24)
Chloride: 93 mmol/L — ABNORMAL LOW (ref 101–111)
GFR calc non Af Amer: 52 mL/min — ABNORMAL LOW (ref >60)
GFR, EST AFRICAN AMERICAN: 60 mL/min — AB (ref >60)
Glucose, Bld: 677 mg/dL (ref 65–99)
Potassium: 3.4 mmol/L — ABNORMAL LOW (ref 3.5–5.1)
SODIUM: 128 mmol/L — AB (ref 135–145)

## 2017-06-09 LAB — MAGNESIUM: MAGNESIUM: 2 mg/dL (ref 1.7–2.4)

## 2017-06-09 MED ORDER — INSULIN ASPART 100 UNIT/ML ~~LOC~~ SOLN
0.0000 [IU] | Freq: Every day | SUBCUTANEOUS | Status: DC
  Administered 2017-06-09: 3 [IU] via SUBCUTANEOUS
  Administered 2017-06-11: 2 [IU] via SUBCUTANEOUS
  Filled 2017-06-09 (×2): qty 1

## 2017-06-09 MED ORDER — ALBUMIN HUMAN 25 % IV SOLN: 25.0000 g | Freq: Once | INTRAVENOUS | Status: DC

## 2017-06-09 MED ORDER — INSULIN ASPART 100 UNIT/ML ~~LOC~~ SOLN
5.0000 [IU] | Freq: Three times a day (TID) | SUBCUTANEOUS | Status: DC
  Administered 2017-06-09 – 2017-06-12 (×7): 5 [IU] via SUBCUTANEOUS
  Filled 2017-06-09 (×7): qty 1

## 2017-06-09 MED ORDER — ONDANSETRON HCL 4 MG/2ML IJ SOLN: 4.0000 mg | Freq: Four times a day (QID) | INTRAMUSCULAR | Status: DC | PRN

## 2017-06-09 MED ORDER — ACETAMINOPHEN 325 MG PO TABS
650.0000 mg | ORAL_TABLET | Freq: Four times a day (QID) | ORAL | Status: DC | PRN
  Administered 2017-06-10 – 2017-06-11 (×2): 650 mg via ORAL
  Filled 2017-06-09 (×2): qty 2

## 2017-06-09 MED ORDER — INSULIN ASPART 100 UNIT/ML ~~LOC~~ SOLN
8.0000 [IU] | Freq: Once | SUBCUTANEOUS | Status: AC
  Administered 2017-06-09: 8 [IU] via INTRAVENOUS
  Filled 2017-06-09: qty 1

## 2017-06-09 MED ORDER — HYDRALAZINE HCL 50 MG PO TABS
25.0000 mg | ORAL_TABLET | Freq: Three times a day (TID) | ORAL | Status: DC
  Administered 2017-06-09 – 2017-06-12 (×8): 25 mg via ORAL
  Filled 2017-06-09 (×8): qty 1

## 2017-06-09 MED ORDER — ASPIRIN EC 81 MG PO TBEC
81.0000 mg | DELAYED_RELEASE_TABLET | Freq: Every day | ORAL | Status: DC
  Administered 2017-06-09 – 2017-06-12 (×4): 81 mg via ORAL
  Filled 2017-06-09 (×4): qty 1

## 2017-06-09 MED ORDER — INSULIN DETEMIR 100 UNIT/ML ~~LOC~~ SOLN
30.0000 [IU] | Freq: Every day | SUBCUTANEOUS | Status: DC
  Administered 2017-06-09 – 2017-06-12 (×4): 30 [IU] via SUBCUTANEOUS
  Filled 2017-06-09 (×4): qty 0.3

## 2017-06-09 MED ORDER — SENNOSIDES-DOCUSATE SODIUM 8.6-50 MG PO TABS: 1.0000 | ORAL_TABLET | Freq: Every evening | ORAL | Status: DC | PRN

## 2017-06-09 MED ORDER — ACETAMINOPHEN 650 MG RE SUPP: 650.0000 mg | Freq: Four times a day (QID) | RECTAL | Status: DC | PRN

## 2017-06-09 MED ORDER — SODIUM CHLORIDE 0.9 % IV BOLUS (SEPSIS): 1000.0000 mL | Freq: Once | INTRAVENOUS | Status: DC

## 2017-06-09 MED ORDER — POTASSIUM CHLORIDE CRYS ER 20 MEQ PO TBCR
40.0000 meq | EXTENDED_RELEASE_TABLET | Freq: Two times a day (BID) | ORAL | Status: DC
  Administered 2017-06-09 – 2017-06-12 (×6): 40 meq via ORAL
  Filled 2017-06-09 (×6): qty 2

## 2017-06-09 MED ORDER — HYDROCODONE-ACETAMINOPHEN 5-325 MG PO TABS: 1.0000 | ORAL_TABLET | Freq: Four times a day (QID) | ORAL | Status: DC | PRN

## 2017-06-09 MED ORDER — INSULIN ASPART 100 UNIT/ML ~~LOC~~ SOLN
0.0000 [IU] | Freq: Three times a day (TID) | SUBCUTANEOUS | Status: DC
  Administered 2017-06-10 (×2): 1 [IU] via SUBCUTANEOUS
  Administered 2017-06-11 (×2): 2 [IU] via SUBCUTANEOUS
  Administered 2017-06-11: 3 [IU] via SUBCUTANEOUS
  Administered 2017-06-12: 1 [IU] via SUBCUTANEOUS
  Filled 2017-06-09 (×7): qty 1

## 2017-06-09 MED ORDER — SODIUM CHLORIDE 0.9 % IV BOLUS (SEPSIS)
1000.0000 mL | Freq: Once | INTRAVENOUS | Status: DC
  Administered 2017-06-09: 1000 mL via INTRAVENOUS

## 2017-06-09 MED ORDER — FUROSEMIDE 10 MG/ML IJ SOLN
80.0000 mg | Freq: Once | INTRAMUSCULAR | Status: AC
  Administered 2017-06-09: 17:00:00 80 mg via INTRAVENOUS
  Filled 2017-06-09: qty 8

## 2017-06-09 MED ORDER — ATORVASTATIN CALCIUM 20 MG PO TABS
40.0000 mg | ORAL_TABLET | Freq: Every day | ORAL | Status: DC
  Administered 2017-06-09 – 2017-06-11 (×3): 40 mg via ORAL
  Filled 2017-06-09 (×3): qty 2

## 2017-06-09 MED ORDER — INSULIN ASPART 100 UNIT/ML ~~LOC~~ SOLN
12.0000 [IU] | Freq: Once | SUBCUTANEOUS | Status: AC
  Administered 2017-06-09: 17:00:00 12 [IU] via SUBCUTANEOUS

## 2017-06-09 MED ORDER — ENOXAPARIN SODIUM 40 MG/0.4ML ~~LOC~~ SOLN
40.0000 mg | SUBCUTANEOUS | Status: DC
  Administered 2017-06-09 – 2017-06-11 (×3): 40 mg via SUBCUTANEOUS
  Filled 2017-06-09 (×3): qty 0.4

## 2017-06-09 MED ORDER — ALBUMIN HUMAN 5 % IV SOLN: 12.5000 g | Freq: Every day | INTRAVENOUS | Status: DC

## 2017-06-09 MED ORDER — PNEUMOCOCCAL VAC POLYVALENT 25 MCG/0.5ML IJ INJ
0.5000 mL | INJECTION | INTRAMUSCULAR | Status: AC
  Administered 2017-06-10: 0.5 mL via INTRAMUSCULAR
  Filled 2017-06-09: qty 0.5

## 2017-06-09 MED ORDER — ALBUMIN HUMAN 25 % IV SOLN
25.0000 g | Freq: Every day | INTRAVENOUS | Status: AC
  Administered 2017-06-10 – 2017-06-11 (×3): 25 g via INTRAVENOUS
  Filled 2017-06-09 (×5): qty 100

## 2017-06-09 MED ORDER — LIDOCAINE HCL (PF) 1 % IJ SOLN
INTRAMUSCULAR | Status: AC
  Administered 2017-06-09: 5 mL via INTRADERMAL
  Filled 2017-06-09: qty 5

## 2017-06-09 MED ORDER — POTASSIUM CHLORIDE CRYS ER 20 MEQ PO TBCR
20.0000 meq | EXTENDED_RELEASE_TABLET | Freq: Once | ORAL | Status: AC
  Administered 2017-06-09: 20 meq via ORAL
  Filled 2017-06-09: qty 1

## 2017-06-09 MED ORDER — PROMETHAZINE HCL 25 MG/ML IJ SOLN: 12.5000 mg | Freq: Four times a day (QID) | INTRAMUSCULAR | Status: DC | PRN

## 2017-06-09 MED ORDER — LIDOCAINE HCL (PF) 1 % IJ SOLN
5.0000 mL | Freq: Once | INTRAMUSCULAR | Status: AC
  Administered 2017-06-09: 5 mL via INTRADERMAL

## 2017-06-09 MED ORDER — FUROSEMIDE 10 MG/ML IJ SOLN
4.0000 mg/h | INTRAVENOUS | Status: AC
  Administered 2017-06-09: 4 mg/h via INTRAVENOUS
  Filled 2017-06-09: qty 25

## 2017-06-09 NOTE — Progress Notes (Addendum)
Pharmacy Electrolyte Monitoring Consult:  Pharmacy consulted to assist in monitoring and replacing electrolytes in this 31 y.o. male admitted on 06/09/2017 with Dizziness and Emesis   Labs:  Sodium (mmol/L)  Date Value  06/09/2017 128 (L)   Potassium (mmol/L)  Date Value  06/09/2017 3.4 (L)   Magnesium (mg/dL)  Date Value  64/33/295112/09/2016 2.0   Phosphorus (mg/dL)  Date Value  88/41/660603/12/2016 1.8 (L)   Calcium (mg/dL)  Date Value  30/16/010912/09/2016 8.0 (L)   Albumin (g/dL)  Date Value  32/35/573212/09/2016 1.1 (L)    Plan: Potassium replaced by MD. Add-on magnesium level WNL. Will f/u AM labs.   Luisa HartChristy, Krystyne Tewksbury D 06/09/2017 6:09 PM

## 2017-06-09 NOTE — ED Provider Notes (Signed)
[facility] Emergency Department Provider Note    First MD Initiated Contact with Patient 06/09/17 1059     (approximate)  I have reviewed the triage vital signs and the nursing notes.   HISTORY  Chief Complaint Dizziness and Emesis    HPI [name] is a 31 y.o. male who presents with dizziness lightheadedness and worsening edema over the past several days.  Patient with recent admission to the hospital for nephrotic syndrome with associated anasarca which improved after IV Lasix drip and significant diuresis of over 30 pounds.  Patient does have some mild shortness of breath but no chest pain.  Is complaining primarily of nausea and decreased oral intake and appetite.  States his been compliant with his home diuretic therapy.  Denies any fevers.  Is still making urine.  States that the urine is less frothy than previously.  Past Medical History:  Diagnosis Date  . Abscess of groin, right 09/09/2016  . 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 Acute gastroenteritis 09/11/2016  . AKI (acute kidney injury) (HCC) 02/19/2013  . Anemia 12/14/2015  . Dehydration 09/11/2016  . Diabetes mellitus without complication (HCC)   . Diabetic acidosis without coma (HCC) 06/01/2016  . Elevated bilirubin 09/11/2016  . Essential hypertension with goal blood pressure less than 130/80 02/07/2015  . Hypercholesterolemia 02/25/2013  . Hyponatremia 09/11/2016  . Hypophosphatemia 12/14/2015  . Hypovitaminosis D 12/16/2015  . Mass of right inguinal region   . Microcytic anemia 11/17/2014  . Non-ST elevation myocardial infarction (NSTEMI) (HCC) 02/19/2013  . Poorly controlled type 1 diabetes mellitus (HCC) 03/03/2012   Overview:  Diagnosed 02/2012.  Now insulin-dependent  . Proteinuria 12/14/2015   Family History  Problem Relation Age of Onset  . Diabetes Mother    Past Surgical History:  Procedure Laterality Date  . LEG AMPUTATION ABOVE KNEE Right     Patient Active Problem List   Diagnosis Date Noted  . Anasarca 04/14/2017  . Dehydration 09/11/2016  . Elevated bilirubin 09/11/2016  . Acute gastroenteritis 09/11/2016  . Abscess of groin, right 09/09/2016  . Diabetic acidosis without coma (HCC) 06/01/2016  . Hypovitaminosis D 12/16/2015  . Anemia 12/14/2015  . Hypophosphatemia 12/14/2015  . 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 Essential hypertension with goal blood pressure less than 130/80 02/07/2015  . Microcytic anemia 11/17/2014  . Hypercholesterolemia 02/25/2013  . AKI (acute kidney injury) (HCC) 02/19/2013  . Non-ST elevation myocardial infarction (NSTEMI) (HCC) 02/19/2013  . Poorly controlled type 1 diabetes mellitus (HCC) 03/03/2012      Prior to Admission medications   Medication Sig Start Date End Date Taking? Authorizing Provider  aspirin EC 81 MG tablet Take 1 tablet (81 mg total) by mouth daily. 04/18/17 04/18/18  Ramonita Lab, MD  atorvastatin (LIPITOR) 40 MG tablet Take 1 tablet (40 mg total) by mouth daily at 6 PM. 04/18/17   Gouru, Aruna, MD  furosemide (LASIX) 80 MG tablet Take 1 tablet (80 mg total) by mouth 2 (two) times daily. 04/18/17   Ramonita Lab, MD  hydrALAZINE (APRESOLINE) 25 MG tablet Take 1 tablet (25 mg total) by mouth every 8 (eight) hours. 04/18/17   Ramonita Lab, MD  HYDROcodone-acetaminophen (NORCO/VICODIN) 5-325 MG tablet Take 1 tablet by mouth every 6 (six) hours as needed for moderate pain. 04/18/17   Gouru, Deanna Artis, MD  insulin aspart (NOVOLOG) 100 UNIT/ML injection Inject 5 Units into the skin 3 (three) times daily before  meals. 04/18/17   Gouru, Deanna ArtisAruna, MD  insulin glargine (LANTUS) 100 UNIT/ML injection Inject 0.25 mLs (25 Units total) into the skin at bedtime. 04/18/17   Gouru, Deanna ArtisAruna, MD  lisinopril (PRINIVIL,ZESTRIL) 20 MG tablet Take 1 tablet (20 mg total) by mouth daily. 04/19/17   Ramonita LabGouru, Aruna, MD  mupirocin cream (BACTROBAN) 2 % Apply topically 2  (two) times daily. 04/18/17   Gouru, Deanna ArtisAruna, MD  potassium chloride SA (K-DUR,KLOR-CON) 20 MEQ tablet Take 2 tablets (40 mEq total) by mouth 2 (two) times daily. 04/18/17   Gouru, Deanna ArtisAruna, MD  senna-docusate (SENOKOT-S) 8.6-50 MG tablet Take 1 tablet by mouth at bedtime as needed for mild constipation. 04/18/17   Ramonita LabGouru, Aruna, MD  sulfamethoxazole-trimethoprim (BACTRIM DS,SEPTRA DS) 800-160 MG tablet Take 1 tablet by mouth 2 (two) times daily. 04/18/17   Ramonita LabGouru, Aruna, MD    Allergies Patient has no known allergies.    Social History Social History   Tobacco Use  . Smoking status: Never Smoker  . Smokeless tobacco: Never Used  Substance Use Topics  . Alcohol use: No  . Drug use: No    Review of Systems Patient denies headaches, rhinorrhea, blurry vision, numbness, shortness of breath, chest pain, edema, cough, abdominal pain, nausea, vomiting, diarrhea, dysuria, fevers, rashes or hallucinations unless otherwise stated above in HPI. ____________________________________________   PHYSICAL EXAM:  VITAL SIGNS: Vitals:   06/09/17 0938  BP: (!) 171/98  Pulse: (!) 106  Resp: 18  Temp: 98.5 F (36.9 C)  SpO2: 100%    Constitutional: Alert and oriented. Chronically ill appearing and in no acute distress. Eyes: Conjunctivae are normal.  Head: Atraumatic. Nose: No congestion/rhinnorhea. Mouth/Throat: Mucous membranes are moist.   Neck: No stridor. Painless ROM.  Cardiovascular: Normal rate, regular rhythm. Grossly normal heart sounds.  Good peripheral circulation. Respiratory: Normal respiratory effort.  No retractions. Lungs CTAB. Gastrointestinal: Soft and nontender. No distention. No abdominal bruits. No CVA tenderness. Genitourinary:  Musculoskeletal: s/p right bka, left leg with 3+ pitting edema.  Chronic venous stasis changes in the left lower extremity. Neurologic:  Normal speech and language. No gross focal neurologic deficits are appreciated. No facial droop Skin:  Skin  is warm, dry and intact. No rash noted. Psychiatric: Mood and affect are normal. Speech and behavior are normal.  ____________________________________________   LABS (all labs ordered are listed, but only abnormal results are displayed)  Results for orders placed or performed during the hospital encounter of 06/09/17 (from the past 24 hour(s))  Glucose, capillary     Status: Abnormal   Collection Time: 06/09/17  9:54 AM  Result Value Ref Range   Glucose-Capillary 582 (HH) 65 - 99 mg/dL   Comment 1 Notify RN    Comment 2 Document in Chart   Urinalysis, Complete w Microscopic     Status: Abnormal   Collection Time: 06/09/17 10:31 AM  Result Value Ref Range   Color, Urine STRAW (A) YELLOW   APPearance CLEAR (A) CLEAR   Specific Gravity, Urine 1.016 1.005 - 1.030   pH 6.0 5.0 - 8.0   Glucose, UA >=500 (A) NEGATIVE mg/dL   Hgb urine dipstick SMALL (A) NEGATIVE   Bilirubin Urine NEGATIVE NEGATIVE   Ketones, ur 20 (A) NEGATIVE mg/dL   Protein, ur 425100 (A) NEGATIVE mg/dL   Nitrite NEGATIVE NEGATIVE   Leukocytes, UA NEGATIVE NEGATIVE   RBC / HPF 0-5 0 - 5 RBC/hpf   WBC, UA 0-5 0 - 5 WBC/hpf   Bacteria, UA NONE SEEN NONE SEEN  Squamous Epithelial / LPF 0-5 (A) NONE SEEN   Mucus PRESENT    ____________________________________________  EKG My review and personal interpretation at Time: 9:42   Indication: dizziness  Rate: 105  Rhythm: sinus Axis: normal  Other: no stemi, normal intervals ____________________________________________  RADIOLOGY  I personally reviewed all radiographic images ordered to evaluate for the above acute complaints and reviewed radiology reports and findings.  These findings were personally discussed with the patient.  Please see medical record for radiology report.  ____________________________________________   PROCEDURES  Procedure(s) performed:  Procedures  Due to difficulty with obtaining IV access, a 20G peripheral IV catheter was inserted using  US guidance into the left arm.  The site was prepped with chlorhexidine and allowed to dry.  The patient tolerated the procedure without any complications.     Critical Care performed: no ____________________________________________   INITIAL IMPRESSION / ASSESSMENT AND PLAN / ED COURSE  Pertinent labs & imaging results that were available during my care of the patient were reviewed by me and considered in my medical decision making (see chart for details).  DDX: AK I, electrolyte abnormality, other, anasarca, acute renal failure, infectious process, medication noncompliance, DKA  [name] is a 31 y.o. who presents to the ED with as described above.  His abdominal exam is soft and benign.  Patient with over 30 pound weight gain since his previous admission.  Renal function is slightly increased as compared to previous but he is got profound hypoalbuminemia and low protein making his presentation concerning for anasarca and persistent nephrotic syndrome.  This will be failing outpatient therapy as he has been compliant with his medications.  Patient also with significant hyperglycemia but no evidence of DKA.  Will give IV insulin.  Patient will require IV insulin and and likely insulin drip.  Based on his significant weight gain I spoke with hospitalist service who kindly agrees to admit patient for further medical management.      ____________________________________________   FINAL CLINICAL IMPRESSION(S) / ED DIAGNOSES  Final diagnoses:  Anasarca  Hypoalbuminemia  Hyperglycemia      NEW MEDICATIONS STARTED DURING THIS VISIT:  This SmartLink is deprecated. Use AVSMEDLIST instead to display the medication list for a patient.   Note:  This document was prepared using Dragon voice recognition software and may include unintentional dictation errors.    Willy Eddyobinson, Braley Luckenbaugh, MD 06/09/17 [phone]

## 2017-06-09 NOTE — ED Notes (Signed)
Date and time results received: 06/09/17 1303 (use smartphrase ".now" to insert current time)  Test: Glucose Critical Value: 677  Name of Provider Notified: Roxan Hockeyobinson

## 2017-06-09 NOTE — ED Triage Notes (Signed)
Pt reports dizziness and vomiting that began two days ago. Pt states he makes his own medical decisions and does not have a legal guardian. No apparent distress noted in triage.

## 2017-06-09 NOTE — H&P (Signed)
[facility]   PATIENT NAME: [name]    MR#:  [id number]  DATE OF BIRTH:  03/08/1986  DATE OF ADMISSION:  06/09/2017  PRIMARY CARE PHYSICIAN: [name], [name], MD   REQUESTING/REFERRING PHYSICIAN: Roxan Hockeyobinson  CHIEF COMPLAINT:    HISTORY OF PRESENT ILLNESS:  [name]  is a 31 y.o. male with a known history of nephrotic syndrome, diabetes mellitus, chronic kidney disease, essential hypertension, hyperlipidemia is presenting to the ED with a chief complaint of nausea and vomiting for 2 days started on Saturday associated with lightheadedness today. Denies any syncopal episodes. Patient also reports that he has gained 35 pounds since his previous hospital admission in October 2018. Reporting exertional dyspnea. Denies any chest pain. Resting comfortably. Feeling thirsty and wants to drink. Denies any abdominal pain.  PAST MEDICAL HISTORY:   Past Medical History:  Diagnosis Date  . Abscess of groin, right 09/09/2016  . 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 Acute gastroenteritis 09/11/2016  . AKI (acute kidney injury) (HCC) 02/19/2013  . Anemia 12/14/2015  . Dehydration 09/11/2016  . Diabetes mellitus without complication (HCC)   . Diabetic acidosis without coma (HCC) 06/01/2016  . Elevated bilirubin 09/11/2016  . Essential hypertension with goal blood pressure less than 130/80 02/07/2015  . Hypercholesterolemia 02/25/2013  . Hyponatremia 09/11/2016  . Hypophosphatemia 12/14/2015  . Hypovitaminosis D 12/16/2015  . Mass of right inguinal region   . Microcytic anemia 11/17/2014  . Non-ST elevation myocardial infarction (NSTEMI) (HCC) 02/19/2013  . Poorly controlled type 1 diabetes mellitus (HCC) 03/03/2012   Overview:  Diagnosed 02/2012.  Now insulin-dependent  . Proteinuria 12/14/2015    PAST SURGICAL HISTOIRY:   Past Surgical History:  Procedure Laterality Date  . LEG AMPUTATION ABOVE KNEE Right      SOCIAL HISTORY:   Social History   Tobacco Use  . Smoking status: Never Smoker  . Smokeless tobacco: Never Used  Substance Use Topics  . Alcohol use: No    FAMILY HISTORY:   Family History  Problem Relation Age of Onset  . Diabetes Mother     DRUG ALLERGIES:  No Known Allergies  REVIEW OF SYSTEMS:  CONSTITUTIONAL: No fever, fatigue or weakness. Reporting 35 pounds weight gain in the past 2 months EYES: No blurred or double vision.  EARS, NOSE, AND THROAT: No tinnitus or ear pain.  RESPIRATORY: No cough, reporting exertional shortness of breath, denies wheezing or hemoptysis.  CARDIOVASCULAR: No chest pain, orthopnea, edema.  GASTROINTESTINAL: Reporting intractable nausea, vomiting for 2 days, denies diarrhea or abdominal pain.  GENITOURINARY: No dysuria, hematuria.  ENDOCRINE: No polyuria, nocturia,  HEMATOLOGY: No anemia, easy bruising or bleeding SKIN: No rash or lesion. MUSCULOSKELETAL: No joint pain or arthritis.   NEUROLOGIC: No tingling, numbness, weakness.  PSYCHIATRY: No anxiety or depression.   MEDICATIONS AT HOME:   Prior to Admission medications   Medication Sig Start Date End Date Taking? Authorizing Provider  insulin aspart (NOVOLOG) 100 UNIT/ML injection Inject 5 Units into the skin 3 (three) times daily before meals. Patient taking differently: Inject 10 Units into the skin 3 (three) times daily with meals.  04/18/17  Yes Magally Vahle, MD  insulin detemir (LEVEMIR) 100 UNIT/ML injection Inject 30 Units into the skin at bedtime.   Yes [provider]  aspirin EC 81 MG tablet Take 1 tablet (81 mg total) by mouth daily. 04/18/17 04/18/18  Ramonita LabGouru, Harshal Sirmon, MD  atorvastatin (LIPITOR) 40 MG  tablet Take 1 tablet (40 mg total) by mouth daily at 6 PM. 04/18/17   Corliss Coggeshall, MD  furosemide (LASIX) 80 MG tablet Take 1 tablet (80 mg total) by mouth 2 (two) times daily. 04/18/17   Ramonita LabGouru, Kelicia Youtz, MD  hydrALAZINE (APRESOLINE) 25 MG tablet Take 1 tablet (25  mg total) by mouth every 8 (eight) hours. 04/18/17   Ramonita LabGouru, Ihsan Nomura, MD  HYDROcodone-acetaminophen (NORCO/VICODIN) 5-325 MG tablet Take 1 tablet by mouth every 6 (six) hours as needed for moderate pain. 04/18/17   Carolee Channell, Deanna ArtisAruna, MD  lisinopril (PRINIVIL,ZESTRIL) 20 MG tablet Take 1 tablet (20 mg total) by mouth daily. 04/19/17   Ramonita LabGouru, Belinda Bringhurst, MD  mupirocin cream (BACTROBAN) 2 % Apply topically 2 (two) times daily. Patient not taking: Reported on 06/09/2017 04/18/17   Ramonita LabGouru, Jamarquis Crull, MD  potassium chloride SA (K-DUR,KLOR-CON) 20 MEQ tablet Take 2 tablets (40 mEq total) by mouth 2 (two) times daily. 04/18/17   Danyon Mcginness, Deanna ArtisAruna, MD  senna-docusate (SENOKOT-S) 8.6-50 MG tablet Take 1 tablet by mouth at bedtime as needed for mild constipation. 04/18/17   Mossie Gilder, Deanna ArtisAruna, MD      VITAL SIGNS:  Blood pressure (!) 171/98, pulse (!) 106, temperature 98.5 F (36.9 C), temperature source Oral, resp. rate 18, height 5\' 6"  (1.676 m), weight 107.8 kg (237 lb 9.6 oz), SpO2 100 %.  PHYSICAL EXAMINATION:  GENERAL:  31 y.o.-year-old patient lying in the bed with no acute distress. Generalized edema EYES: Pupils equal, round, reactive to light and accommodation. No scleral icterus. Extraocular muscles intact.  HEENT: Head atraumatic, normocephalic. Oropharynx and nasopharynx clear.  NECK:  Supple, no jugular venous distention. No thyroid enlargement, no tenderness.  LUNGS: Normal breath sounds bilaterally, no wheezing, rales,rhonchi or crepitation. No use of accessory muscles of respiration.  CARDIOVASCULAR: S1, S2 normal. No murmurs, rubs, or gallops.  ABDOMEN: Soft, nontender, nondistended. Bowel sounds present. No organomegaly or mass.  EXTREMITIES: No pedal edema, cyanosis, or clubbing.  NEUROLOGIC: Cranial nerves II through XII are intact. Muscle strength 5/5 in all extremities. Sensation intact. Gait not checked.  PSYCHIATRIC: The patient is alert and oriented x 3.  SKIN: Anasarca, No obvious rash, lesion, or ulcer.    LABORATORY PANEL:   CBC Recent Labs  Lab 06/09/17 1030  WBC 8.3  HGB 8.8*  HCT 27.4*  PLT 284   ------------------------------------------------------------------------------------------------------------------  Chemistries  Recent Labs  Lab 06/09/17 1030  NA 128*  K 3.4*  CL 93*  CO2 21*  GLUCOSE 677*  BUN 27*  CREATININE 1.69*  CALCIUM 8.0*  AST 25  ALT 19  ALKPHOS 133*  BILITOT 1.0   ------------------------------------------------------------------------------------------------------------------  Cardiac Enzymes No results for input(s): TROPONINI in the last 168 hours. ------------------------------------------------------------------------------------------------------------------  RADIOLOGY:  No results found.  EKG:   Orders placed or performed during the hospital encounter of 06/09/17  . EKG 12-Lead  . EKG 12-Lead  . ED EKG  . ED EKG    IMPRESSION AND PLAN:   [name]  is a 31 y.o. male with a known history of nephrotic syndrome, diabetes mellitus, chronic kidney disease, essential hypertension, hyperlipidemia is presenting to the ED with a chief complaint of nausea and vomiting for 2 days started on Saturday associated with lightheadedness today  #Anasarca secondary to nephrotic syndrome Admit to med-surgical unit IV Lasix Monitor daily weights, intake and output Nephrology consulted  #Severe hypoalbuminemia secondary to proteinuria from nephrotic syndrome IV albumin Consult dietitian  #Acute gastritis with nausea and vomiting Supportive treatment with antiemetics ppi  #  Hyponatremia secondary to fluid overload Patient is started on IV Lasix GTT Monitor sodium levels Repeat a.m. Labs  #Hypokalemia Replete and recheck  #Diabetes mellitus Insulin sliding scale  DVT prophylaxis with Lovenox subcutaneous GI prophylaxis with Protonix  All the records are reviewed and case discussed with ED provider. Management plans  discussed with the patient, family and they are in agreement.  CODE STATUS: Full code, sister is the healthcare power of attorney  TOTAL TIME TAKING CARE OF THIS PATIENT: .   Note: This dictation was prepared with Dragon dictation along with smaller phrase technology. Any transcriptional errors that result from this process are unintentional.  Ramonita Lab M.D on 06/09/2017 at 2:58 PM  Between 7am to 6pm - Pager - [phone]  After 6pm go to [email or web address] - password [email or web address] [facility]  [facility]  Office  [phone]  CC: Primary care physician; Oswaldo Conroy, MD

## 2017-06-09 NOTE — ED Notes (Signed)
IV RN at bedside. Pt is A/O and NAD.

## 2017-06-09 NOTE — ED Notes (Signed)
IV team at bedside 

## 2017-06-09 NOTE — Progress Notes (Signed)
Anticoagulation monitoring(Lovenox):  31 yo male ordered Lovenox 30 mg Q24h  Filed Weights   06/09/17 0938 06/09/17 1411 06/09/17 1523  Weight: 201 lb (91.2 kg) 237 lb 9.6 oz (107.8 kg) 223 lb 6.4 oz (101.3 kg)   BMI    Lab Results  Component Value Date   CREATININE 1.69 (H) 06/09/2017   CREATININE 1.35 (H) 04/18/2017   CREATININE 1.23 04/17/2017   Estimated Creatinine Clearance: 69.3 mL/min (A) (by C-G formula based on SCr of 1.69 mg/dL (H)). Hemoglobin & Hematocrit     Component Value Date/Time   HGB 8.8 (L) 06/09/2017 1030   HCT 27.4 (L) 06/09/2017 1030     Per Protocol for Patient with estCrcl > 30 ml/min and BMI < 40, will transition to Lovenox 40 mg Q24h.

## 2017-06-09 NOTE — ED Notes (Signed)
IV team unable to get access after 3 attempts. EDP aware

## 2017-06-10 ENCOUNTER — Inpatient Hospital Stay (HOSPITAL_COMMUNITY)
Admit: 2017-06-10 | Discharge: 2017-06-10 | Disposition: A | Discharge status: Home / Self Care | Payer: Medicare Other | Attending: Internal Medicine | Admitting: Internal Medicine

## 2017-06-10 DIAGNOSIS — I340 Nonrheumatic mitral (valve) insufficiency: Secondary | ICD-10-CM

## 2017-06-10 LAB — GLUCOSE, CAPILLARY
GLUCOSE-CAPILLARY: 106 mg/dL — AB (ref 65–99)
GLUCOSE-CAPILLARY: 118 mg/dL — AB (ref 65–99)
GLUCOSE-CAPILLARY: 130 mg/dL — AB (ref 65–99)
GLUCOSE-CAPILLARY: 145 mg/dL — AB (ref 65–99)
GLUCOSE-CAPILLARY: 53 mg/dL — AB (ref 65–99)
GLUCOSE-CAPILLARY: 66 mg/dL (ref 65–99)
GLUCOSE-CAPILLARY: 99 mg/dL (ref 65–99)
Glucose-Capillary: 69 mg/dL (ref 65–99)

## 2017-06-10 LAB — CBC
HEMATOCRIT: 26.7 % — AB (ref 40.0–52.0)
HEMOGLOBIN: 9 g/dL — AB (ref 13.0–18.0)
MCH: 26.7 pg (ref 26.0–34.0)
MCHC: 33.7 g/dL (ref 32.0–36.0)
MCV: 79.1 fL — AB (ref 80.0–100.0)
Platelets: 275 10*3/uL (ref 150–440)
RBC: 3.38 MIL/uL — ABNORMAL LOW (ref 4.40–5.90)
RDW: 14 % (ref 11.5–14.5)
WBC: 7.2 10*3/uL (ref 3.8–10.6)

## 2017-06-10 LAB — COMPREHENSIVE METABOLIC PANEL
ALBUMIN: 1.4 g/dL — AB (ref 3.5–5.0)
ALT: 15 U/L — ABNORMAL LOW (ref 17–63)
ANION GAP: 9 (ref 5–15)
AST: 28 U/L (ref 15–41)
Alkaline Phosphatase: 68 U/L (ref 38–126)
BILIRUBIN TOTAL: 0.4 mg/dL (ref 0.3–1.2)
BUN: 22 mg/dL — ABNORMAL HIGH (ref 6–20)
CHLORIDE: 96 mmol/L — AB (ref 101–111)
CO2: 28 mmol/L (ref 22–32)
Calcium: 8.1 mg/dL — ABNORMAL LOW (ref 8.9–10.3)
Creatinine, Ser: 1.2 mg/dL (ref 0.61–1.24)
GFR calc Af Amer: >60 mL/min (ref >60)
GFR calc non Af Amer: >60 mL/min (ref >60)
GLUCOSE: 84 mg/dL (ref 65–99)
POTASSIUM: 2.6 mmol/L — AB (ref 3.5–5.1)
SODIUM: 133 mmol/L — AB (ref 135–145)
TOTAL PROTEIN: 5.1 g/dL — AB (ref 6.5–8.1)

## 2017-06-10 LAB — POTASSIUM: Potassium: 3.1 mmol/L — ABNORMAL LOW (ref 3.5–5.1)

## 2017-06-10 LAB — HEMOGLOBIN A1C
HEMOGLOBIN A1C: 14.1 % — AB (ref 4.8–5.6)
MEAN PLASMA GLUCOSE: 357.97 mg/dL

## 2017-06-10 LAB — MAGNESIUM: Magnesium: 1.9 mg/dL (ref 1.7–2.4)

## 2017-06-10 MED ORDER — FLUOXETINE HCL 20 MG PO CAPS
40.0000 mg | ORAL_CAPSULE | Freq: Every day | ORAL | Status: DC
  Administered 2017-06-10 – 2017-06-12 (×3): 40 mg via ORAL
  Filled 2017-06-10 (×3): qty 2

## 2017-06-10 MED ORDER — FUROSEMIDE 10 MG/ML IJ SOLN
4.0000 mg/h | INTRAVENOUS | Status: DC
  Filled 2017-06-10: qty 25

## 2017-06-10 MED ORDER — POTASSIUM CHLORIDE CRYS ER 20 MEQ PO TBCR
40.0000 meq | EXTENDED_RELEASE_TABLET | Freq: Once | ORAL | Status: AC
Start: 2017-06-10 — End: 2017-06-10
  Administered 2017-06-10: 40 meq via ORAL

## 2017-06-10 MED ORDER — FUROSEMIDE 10 MG/ML IJ SOLN
4.0000 mg/h | INTRAMUSCULAR | Status: DC
  Administered 2017-06-10: 4 mg/h via INTRAVENOUS
  Filled 2017-06-10 (×2): qty 25

## 2017-06-10 MED ORDER — POTASSIUM CHLORIDE CRYS ER 20 MEQ PO TBCR
20.0000 meq | EXTENDED_RELEASE_TABLET | Freq: Once | ORAL | Status: AC
  Administered 2017-06-10: 22:00:00 20 meq via ORAL
  Filled 2017-06-10: qty 1

## 2017-06-10 MED ORDER — LISINOPRIL 20 MG PO TABS
20.0000 mg | ORAL_TABLET | Freq: Every day | ORAL | Status: DC
  Administered 2017-06-10 – 2017-06-12 (×3): 20 mg via ORAL
  Filled 2017-06-10 (×3): qty 1

## 2017-06-10 MED ORDER — POTASSIUM CHLORIDE 10 MEQ/100ML IV SOLN
10.0000 meq | INTRAVENOUS | Status: DC
  Administered 2017-06-10: 10 meq via INTRAVENOUS
  Filled 2017-06-10: qty 100

## 2017-06-10 NOTE — Progress Notes (Addendum)
Pharmacy Electrolyte Monitoring Consult:  Pharmacy consulted to assist in monitoring and replacing electrolytes in this 31 y.o. male admitted on 06/09/2017 with Dizziness and Emesis   Labs:  Sodium (mmol/L)  Date Value  06/10/2017 133 (L)   Potassium (mmol/L)  Date Value  06/10/2017 2.6 (LL)   Magnesium (mg/dL)  Date Value  16/10/960412/09/2016 2.0   Phosphorus (mg/dL)  Date Value  54/09/811903/12/2016 1.8 (L)   Calcium (mg/dL)  Date Value  14/78/295612/10/2016 8.1 (L)   Albumin (g/dL)  Date Value  21/30/865712/10/2016 1.4 (L)   K has decreased further since admission (3.4 >>2.6) Mag level pending  Plan: Will order KCl 10 mEq IV x 4 runs Continue KCl 40 PO BID for today. Recheck K at 1800 Will f/u Mag and K AM labs.   Marty HeckWang, Morio Widen L 06/10/2017 8:34 AM                      Addendum Mag 1.9, no supp needed  Crist FatHannah Makih Stefanko, PharmD, BCPS Clinical Pharmacist 06/10/2017 9:58 AM

## 2017-06-10 NOTE — Clinical Social Work Note (Signed)
Clinical Social Work Assessment  Patient Details  Name: [name] MRN: [id number] Date of Birth: [birth year]  Date of referral:  06/10/17               Reason for consult:  [employer] sought to share information with:    Permission granted to share information::     Name::        Agency::     Relationship::     Contact Information:     Housing/Transportation Living arrangements for the past 2 months:  Single Family Home Source of Information:  Patient Patient Interpreter Needed:  None Criminal Activity/Legal Involvement Pertinent to Current Situation/Hospitalization:  No - Comment as needed Significant Relationships:  Friend Lives with:  Friends Do you feel safe going back to the place where you live?  Yes Need for family participation in patient care:  Yes (Comment)  Care giving concerns:  Patient reported that he lives with his best friend who is a male in [place].    Social Worker assessment / plan:  Holiday representative (CSW) received a consult to assess for needs. CSW met with patient alone at bedside to address consult. Patient was alert and oriented X4 and was laying in the bed with the covers over his head. Patient took the covers off his head when CSW entered the room. CSW introduced self and explained role of CSW department. Patient reported that he lives with a friend and does not work. Per patient he is not on disability. Per patient his friends and family provide him transport. Patient denied alcohol and drug use. CSW provided patient with a list of [employer]. Patient reported no needs at this time. Please reconsult if future social work needs arise. CSW signing off.    Employment status:  Unemployed Forensic scientist:  Self Pay (Medicaid Pending) PT Recommendations:  Not assessed at this time Information / Referral to community resources:  Other (Comment Required)([employer]. )  Patient/Family's Response to care:  Patient reported no needs at this time.   Patient/Family's Understanding of and Emotional Response to Diagnosis, Current Treatment, and Prognosis:  Patient was pleasant and thanked CSW for visit.   Emotional Assessment Appearance:  Appears stated age Attitude/Demeanor/Rapport:    Affect (typically observed):  Pleasant Orientation:  Oriented to Self, Oriented to Place, Oriented to  Time, Oriented to Situation Alcohol / Substance use:  Not Applicable Psych involvement (Current and /or in the community):  No (Comment)  Discharge Needs  Concerns to be addressed:  No discharge needs identified Readmission within the last 30 days:  No Current discharge risk:  None Barriers to Discharge:  Continued Medical Work up   [employer], Veronia Beets, LCSW 06/10/2017, 3:24 PM

## 2017-06-10 NOTE — Progress Notes (Signed)
MD Sheryle Hailiamond paged to make aware of Potassium level of 2.6

## 2017-06-10 NOTE — Progress Notes (Addendum)
Pharmacy Electrolyte Monitoring Consult:  Pharmacy consulted to assist in monitoring and replacing electrolytes in this 31 y.o. male admitted on 06/09/2017 with Dizziness and Emesis   Labs:  Sodium (mmol/L)  Date Value  06/10/2017 133 (L)   Potassium (mmol/L)  Date Value  06/10/2017 3.1 (L)   Magnesium (mg/dL)  Date Value  69/62/952812/10/2016 1.9   Phosphorus (mg/dL)  Date Value  41/32/440103/12/2016 1.8 (L)   Calcium (mg/dL)  Date Value  02/72/536612/10/2016 8.1 (L)   Albumin (g/dL)  Date Value  44/03/474212/10/2016 1.4 (L)   K up to 3.1, Mg WNL. It appears as though pt only received 1 run of 10 MEQ IV and a total of 80 MEQ po. Of note pt has been restarted on lasix drip  Plan: Pt already has a order for 40 MEQ at 2200. Will continue with this order but will also add another 20 MEQ once now and recheck in the AM  Kina Shiffman D Margarete Horace, Pharm.D, BCPS Clinical Pharmacist  06/10/2017 7:21 PM

## 2017-06-10 NOTE — Progress Notes (Signed)
Initial Nutrition Assessment  DOCUMENTATION CODES:   Obesity unspecified  INTERVENTION:  Reviewed "Nephrotic Syndrome Nutrition Therapy" handout from the Academy of Nutrition and Dietetics with patient. Encouraged moderate protein intake of 75-90 grams/day and reviewed serving sizes of protein. Encouraged low sodium diet to help prevent accumulation of fluid. Discussed strategies to decrease sodium intake including preparing fresh or frozen foods at home and reducing intake at restaurants. Showed patient amount of sodium in some of his favorite restaurant foods.  NUTRITION DIAGNOSIS:   Food and nutrition related knowledge deficit related to limited prior education as evidenced by per patient/family report.  GOAL:   Patient will meet greater than or equal to 90% of their needs  MONITOR:   PO intake, Labs, Weight trends, Skin, I & O's  REASON FOR ASSESSMENT:   Consult Assessment of nutrition requirement/status  ASSESSMENT:   31 year old male with PMHx of DM type 1, HTN, hx NSTEMI 2014, hypercholesterolemia, hypophosphatemia, vitamin D deficiency, microcytic anemia, nephrotic syndrome, hx right AKA who presented with 2 day history of N/V and lightheadedness, weight gain since last admission in October 2018.   Met with patient at bedside. When RD entered the room he had covers over his head, but woke up and was able to actively participate in assessment and education. He reports he has had a decreased appetite off and on for a while now. He reports it started before his recent admission in October, but he is not exactly sure when. Most days he is able to eat very well. He typically eats lunch and dinner daily because he sleeps too late to eat breakfast. For lunch he may have a sandwich or leftovers. Dinner is usually chicken, pork chops, McDonald's or [employer] (he reports he usually orders double portions of menu items). He snacks on sugar-free snacks during the day. Every now and then he  has a day where he cannot eat as well. He reports he has not previously received education on nutrition therapy for nephrotic syndrome and he is interested in learning about it. He reports his blood sugar is usually in "good" control but reports that to be "less than 300." Noted patient has HgbA1c of 14.1.  Patient reports his UBW used to be 181 lbs, which is what he reports he weighed before having issues with fluid retention (though weight in chart of 181 lbs in September appears to be stated and not a true measured weight). However, patient was 162.9 lbs on 09/09/2016. On 04/14/2017 he was 231.4 lbs when he presented with anasarca. On 04/17/2017 he was down to 201.2 lbs (approximately 91.5 kg). Will use 91.5 kg to estimate needs as it is likely close to his dry weight. Patient's right AKA was on 01/03/2015, so does not affect recent weight fluctuations (of note, at that time he was 196.9 lbs).  Discussed with Dr. Candiss Norse. Focus of diet education should be on low sodium diet. No fluid restriction needed at this time. Will also educate on moderate protein restriction of 0.8-1 grams/kg that is recommended with nephrotic syndrome.  Meal Completion: 100%  Medications reviewed and include: Novolog 0-9 units TID, Novolog 0-5 units QHS, Novolog 5 units TID before meals, Levemir 30 units daily, potassium chloride 40 mEq BID, human albumin 25 grams daily IV, Lasix.  Labs reviewed: CBG 53-432, Sodium 133, Potassium 2.6, Chloride 96, BUN 22, HgbA1c 14.1. Last time phosphorus was checked it was 1.8 on 09/10/2016 (unsure if that was supplemented or rechecked at an outside office).  Patient  does not meet criteria for malnutrition at this time.  NUTRITION - FOCUSED PHYSICAL EXAM:    Most Recent Value  Orbital Region  No depletion  Upper Arm Region  No depletion  Thoracic and Lumbar Region  No depletion  Buccal Region  No depletion  Temple Region  No depletion  Clavicle Bone Region  No depletion  Clavicle and  Acromion Bone Region  No depletion  Scapular Bone Region  No depletion  Dorsal Hand  No depletion  Patellar Region  Unable to assess [severe edema LLE,  s/p right AKA]  Anterior Thigh Region  Unable to assess [severe edema LLE,  s/p right AKA]  Posterior Calf Region  Unable to assess [severe edema LLE,  s/p right AKA]  Edema (RD Assessment)  Severe  Hair  Reviewed  Eyes  Reviewed  Mouth  Reviewed  Skin  Reviewed  Nails  Reviewed     Diet Order:  Diet Carb Modified Fluid consistency: Thin; Room service appropriate? Yes  EDUCATION NEEDS:   Education needs have been addressed  Skin:  Skin Assessment: Skin Integrity Issues: Skin Integrity Issues:: Other (Comment) Other: s/p right AKA; water blisters on left leg  Last BM:  PTA (06/08/2017)  Height:   Ht Readings from Last 1 Encounters:  06/09/17 5' 6"  (1.676 m)    Weight:   Wt Readings from Last 1 Encounters:  06/09/17 223 lb 6.4 oz (101.3 kg)    Ideal Body Weight:  59.4 kg(adjusted for right AKA)  BMI:  Body mass index is 36.06 kg/m.  Estimated Nutritional Needs:   Kcal:  1980-2160 (MSJ 1799 x 1.1-1.2)  Protein:  73-91 grams (0.8-1 grams/kg in setting of nephrotic syndrome)  Fluid:  1.9-2.2 L/day (30-35 mL/kg IBW); per nephrology no fluid restriction recommended at this time  Willey Blade, [place], [place], [place] Office: [phone] Pager: [phone] After Hours/Weekend Pager: [phone]

## 2017-06-10 NOTE — Progress Notes (Signed)
[facility] [place] [facility]   PATIENT NAME: [name]    MR#:  [id number]  DATE OF BIRTH:  10/14/1985  SUBJECTIVE:  CHIEF COMPLAINT:   Chief Complaint  Patient presents with  . Dizziness  . Emesis   - Admitted with fluid overload and started on Lasix drip. Still has significant edema. - voiding well   REVIEW OF SYSTEMS:  Review of Systems  Constitutional: Negative for chills, fever and malaise/fatigue.  HENT: Negative for congestion, ear discharge, hearing loss and nosebleeds.   Eyes: Negative for blurred vision and double vision.  Respiratory: Negative for cough, shortness of breath and wheezing.   Cardiovascular: Positive for leg swelling. Negative for chest pain and palpitations.  Gastrointestinal: Negative for abdominal pain, constipation, diarrhea, nausea and vomiting.  Genitourinary: Negative for dysuria.  Neurological: Negative for dizziness, speech change, focal weakness, seizures and headaches.  Psychiatric/Behavioral: Negative for depression.    DRUG ALLERGIES:  No Known Allergies  VITALS:  Blood pressure (!) 142/73, pulse 84, temperature 97.8 F (36.6 C), temperature source Oral, resp. rate 16, height 5\' 6"  (1.676 m), weight 101.3 kg (223 lb 6.4 oz), SpO2 99 %.  PHYSICAL EXAMINATION:  Physical Exam  GENERAL:  31 y.o.-year-old obese patient lying in the bed with no acute distress.  EYES: Pupils equal, round, reactive to light and accommodation. No scleral icterus. Extraocular muscles intact.  HEENT: Head atraumatic, normocephalic. Oropharynx and nasopharynx clear. Facial edema and eyelid edema NECK:  Supple, no jugular venous distention. No thyroid enlargement, no tenderness.  LUNGS: Normal breath sounds bilaterally, no wheezing, rales,rhonchi or crepitation. No use of accessory muscles of respiration. Decreased bibasilar breath sounds CARDIOVASCULAR: S1, S2 normal. No murmurs, rubs, or gallops.  ABDOMEN: Soft, nontender,  nondistended. Bowel sounds present. No organomegaly or mass.  EXTREMITIES: No cyanosis, or clubbing. Right AKA, left leg with 3+ edema NEUROLOGIC: Cranial nerves II through XII are intact. Muscle strength 5/5 in all extremities. Sensation intact. Gait not checked.  PSYCHIATRIC: The patient is alert and oriented x 3.  SKIN: No obvious rash, lesion, or ulcer.    LABORATORY PANEL:   CBC Recent Labs  Lab 06/10/17 0515  WBC 7.2  HGB 9.0*  HCT 26.7*  PLT 275   ------------------------------------------------------------------------------------------------------------------  Chemistries  Recent Labs  Lab 06/10/17 0515  NA 133*  K 2.6*  CL 96*  CO2 28  GLUCOSE 84  BUN 22*  CREATININE 1.20  CALCIUM 8.1*  MG 1.9  AST 28  ALT 15*  ALKPHOS 68  BILITOT 0.4   ------------------------------------------------------------------------------------------------------------------  Cardiac Enzymes No results for input(s): TROPONINI in the last 168 hours. ------------------------------------------------------------------------------------------------------------------  RADIOLOGY:  Dg Chest Portable 1 View  Result Date: 06/09/2017 CLINICAL DATA:  Lower extremity edema and dizziness.  Hypertension. EXAM: PORTABLE CHEST 1 VIEW COMPARISON:  None. FINDINGS: Lungs are clear. Heart size and pulmonary vascularity are normal. No pneumothorax. No adenopathy. No bone lesions. IMPRESSION: No edema or consolidation. Electronically Signed   By: Bretta BangWilliam  Woodruff III M.D.   On: 06/09/2017 14:58    EKG:   Orders placed or performed during the hospital encounter of 06/09/17  . EKG 12-Lead  . EKG 12-Lead  . ED EKG  . ED EKG    ASSESSMENT AND PLAN:   31 year old male with past medical history significant for hypertension, diabetes mellitus, nephrotic syndrome and CK-MB presents to hospital secondary to difficulty breathing and fluid overload.  #1 anasarca-patient has significant hypoalbuminemia  and also nephrotic syndrome likely from  diabetes -Empirically with Lasix drip. Continue Lasix for today -Significant hypoalbuminemia with albumin of 1.4. Agree with IV albumin for 3 days. -Monitor urine output. Change to oral Lasix once anasarca is improving.  #2 hypokalemia-secondary to being on Lasix drip, replace aggressively and recheck later today.  #3 diabetes mellitus-well controlled in the hospital, became hypoglycemic while on clear liquids early this morning. Continue home dose of Levemir and sliding scale insulin now that he is started on a diet.  #4 nephrotic syndrome-likely diabetes-induced renal damage. -Lisinopril for his proteinuria added. -Appreciate nephrology consult. Continue albumin infusion.  #5 DVT prophylaxis-on Lovenox    All the records are reviewed and case discussed with Care Management/Social Workerr. Management plans discussed with the patient, family and they are in agreement.  CODE STATUS: Full Code  TOTAL TIME TAKING CARE OF THIS PATIENT: 38 minutes.   POSSIBLE D/C IN 1-2 DAYS, DEPENDING ON CLINICAL CONDITION.   Jaynia Fendley M.D on 06/10/2017 at 12:51 PM  Between 7am to 6pm - Pager - [phone]  After 6pm go to [email or web address] - Social research officer, [email or web address] [email or web address]  [facility]  Office  [phone]  CC: Primary care physician; [name], [name], MD

## 2017-06-10 NOTE — Consult Note (Signed)
Date: 06/10/2017                  Patient Name:  [name]  MRN: [id number]  DOB: [birth year]  Age / Sex: 31 y.o., male         PCP: Oswaldo Conroy, MD                 Service Requesting Consult:  Hospitalist/ Enid Baas, MD                 Reason for Consult:  Volume overload and anasarca            History of Present Illness: Patient is a 31 y.o. male with medical problems of type 1 diabetes, nephrotic proteinuria, hypertension, who was admitted to [facility] on 06/09/2017 for evaluation of worsening volume overload.  Patient states that he was in his usual state of health until Saturday.  That evening, he starte Have uncontrolled nausea and vomiting.  He felt lightheaded.  He also reports that since his discharge in October, he has gained  a large amount of fluid.  His leg, arms and face are swollen Upon presentation, his blood pressure was significantly elevated at 171/98 Patient states that he did not miss any of his medications.  He states he is compliant with his insulin and other meds.  Medications: Outpatient medications: Medications Prior to Admission  Medication Sig Dispense Refill Last Dose  . FLUoxetine (PROZAC) 40 MG capsule Take 40 mg by mouth daily.   06/08/2017 at am  . insulin aspart (NOVOLOG) 100 UNIT/ML injection Inject 5 Units into the skin 3 (three) times daily before meals. (Patient taking differently: Inject 10 Units into the skin 3 (three) times daily with meals. ) 100 mL 0 06/08/2017 at pm  . insulin detemir (LEVEMIR) 100 UNIT/ML injection Inject 30 Units into the skin at bedtime.   06/08/2017 at pm  . lisinopril-hydrochlorothiazide (PRINZIDE,ZESTORETIC) 20-12.5 MG tablet Take 1 tablet by mouth daily.   06/08/2017 at am  . rosuvastatin (CRESTOR) 10 MG tablet Take 10 mg by mouth every evening.   06/08/2017 at pm    Current medications: Current Facility-Administered Medications  Medication Dose Route Frequency Provider Last Rate Last Dose  .  acetaminophen (TYLENOL) tablet 650 mg  650 mg Oral Q6H PRN Gouru, Aruna, MD   650 mg at 06/10/17 0112   Or  . acetaminophen (TYLENOL) suppository 650 mg  650 mg Rectal Q6H PRN Gouru, Aruna, MD      . albumin human 25 % solution 25 g  25 g Intravenous Daily Gouru, Aruna, MD   25 g at 06/10/17 0111  . aspirin EC tablet 81 mg  81 mg Oral Daily Gouru, Aruna, MD   81 mg at 06/10/17 1033  . atorvastatin (LIPITOR) tablet 40 mg  40 mg Oral q1800 Gouru, Aruna, MD   40 mg at 06/10/17 1302  . enoxaparin (LOVENOX) injection 40 mg  40 mg Subcutaneous Q24H Gouru, Aruna, MD   40 mg at 06/09/17 2212  . FLUoxetine (PROZAC) capsule 40 mg  40 mg Oral Daily Enid Baas, MD      . furosemide (LASIX) 250 mg in dextrose 5 % 250 mL (1 mg/mL) infusion  4 mg/hr Intravenous Continuous Enid Baas, MD      . hydrALAZINE (APRESOLINE) tablet 25 mg  25 mg Oral Q8H Gouru, Aruna, MD   25 mg at 06/10/17 1302  . HYDROcodone-acetaminophen (NORCO/VICODIN) 5-325 MG per tablet 1 tablet  1 tablet  Oral Q6H PRN Gouru, Aruna, MD      . insulin aspart (novoLOG) injection 0-5 Units  0-5 Units Subcutaneous QHS Ramonita Lab, MD   3 Units at 06/09/17 2212  . insulin aspart (novoLOG) injection 0-9 Units  0-9 Units Subcutaneous TID WC Gouru, Aruna, MD   1 Units at 06/10/17 1302  . insulin aspart (novoLOG) injection 5 Units  5 Units Subcutaneous TID Marga Melnick, MD   5 Units at 06/10/17 1303  . insulin detemir (LEVEMIR) injection 30 Units  30 Units Subcutaneous Daily Gouru, Aruna, MD   30 Units at 06/10/17 1303  . lisinopril (PRINIVIL,ZESTRIL) tablet 20 mg  20 mg Oral Daily Enid Baas, MD      . ondansetron [facility]) injection 4 mg  4 mg Intravenous Q6H PRN Gouru, Aruna, MD      . potassium chloride SA (K-DUR,KLOR-CON) CR tablet 40 mEq  40 mEq Oral BID Gouru, Aruna, MD   40 mEq at 06/10/17 1035  . senna-docusate (Senokot-S) tablet 1 tablet  1 tablet Oral QHS PRN Gouru, Aruna, MD          Allergies: No Known Allergies     Past Medical History: Past Medical History:  Diagnosis Date  . Abscess of groin, right 09/09/2016  . 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 Acute gastroenteritis 09/11/2016  . AKI (acute kidney injury) (HCC) 02/19/2013  . Anemia 12/14/2015  . Dehydration 09/11/2016  . Diabetes mellitus without complication (HCC)   . Diabetic acidosis without coma (HCC) 06/01/2016  . Elevated bilirubin 09/11/2016  . Essential hypertension with goal blood pressure less than 130/80 02/07/2015  . Hypercholesterolemia 02/25/2013  . Hyponatremia 09/11/2016  . Hypophosphatemia 12/14/2015  . Hypovitaminosis D 12/16/2015  . Mass of right inguinal region   . Microcytic anemia 11/17/2014  . Non-ST elevation myocardial infarction (NSTEMI) (HCC) 02/19/2013  . Poorly controlled type 1 diabetes mellitus (HCC) 03/03/2012   Overview:  Diagnosed 02/2012.  Now insulin-dependent  . Proteinuria 12/14/2015     Past Surgical History: Past Surgical History:  Procedure Laterality Date  . LEG AMPUTATION ABOVE KNEE Right      Family History: Family History  Problem Relation Age of Onset  . Diabetes Mother      Social History: Social History   Socioeconomic History  . Marital status: Single    Spouse name: Not on file  . Number of children: Not on file  . Years of education: Not on file  . Highest education level: Not on file  Social Needs  . Financial resource strain: Not on file  . Food insecurity - worry: Not on file  . Food insecurity - inability: Not on file  . Transportation needs - medical: Not on file  . Transportation needs - non-medical: Not on file  Occupational History  . Not on file  Tobacco Use  . Smoking status: Never Smoker  . Smokeless tobacco: Never Used  Substance and Sexual Activity  . Alcohol use: No  . Drug use: No  . Sexual activity: Not on file  Other Topics Concern  . Not on file  Social History Narrative  . Not on file     Review of Systems: Gen: No  headaches he does report weight gain associated with fluid.  HEENT: Has not had diabetic  eye exam in a long time.  Denies any blurry vision or hearing problems CV: Reports lower extremity edema and generalized edema over his body Resp: He does report  a cough; but does not have sputum production GI: Appetite was low while he was sick.  Appears to be improving GU : No problems reported MS: Has right leg above-the-knee amputation Derm:  No acute skin complaints Psych: No complaints Heme: No complaints Neuro: No complaints Endocrine.  Hemoglobin A1c was high in October  Vital Signs: Blood pressure (!) 142/73, pulse 84, temperature 97.8 F (36.6 C), temperature source Oral, resp. rate 16, height 5\' 6"  (1.676 m), weight 101.3 kg (223 lb 6.4 oz), SpO2 99 %.   Intake/Output Summary (Last 24 hours) at 06/10/2017 1315 Last data filed at 06/10/2017 0900 Gross per 24 hour  Intake 1051.2 ml  Output 3450 ml  Net -2398.8 ml    Weight trends: Filed Weights   06/09/17 [id number] 06/09/17 1411 06/09/17 1523  Weight: 91.2 kg (201 lb) 107.8 kg (237 lb 9.6 oz) 101.3 kg (223 lb 6.4 oz)    Physical Exam: General:  No acute distress, laying in the bed  HEENT  anicteric, moist oral mucous membranes  Neck:  Supple  Lungs:  Normal breathing effort, mild scattered rhonchi at bases  Heart::  Regular rhythm  Abdomen:  Soft, nontender, nondistended  Extremities:  Right above-the-knee amputation, left leg has pitting edema  Neurologic:  Alert, oriented  Skin:  Chronic stasis, dry skin over lower leg, furuncle on left thigh             Lab results: Basic Metabolic Panel: Recent Labs  Lab 06/09/17 1030 06/10/17 0515  NA 128* 133*  K 3.4* 2.6*  CL 93* 96*  CO2 21* 28  GLUCOSE 677* 84  BUN 27* 22*  CREATININE 1.69* 1.20  CALCIUM 8.0* 8.1*  MG 2.0 1.9    Liver Function Tests: Recent Labs  Lab 06/10/17 0515  AST 28  ALT 15*  ALKPHOS 68  BILITOT 0.4  PROT 5.1*  ALBUMIN 1.4*   No results  for input(s): LIPASE, AMYLASE in the last 168 hours. No results for input(s): AMMONIA in the last 168 hours.  CBC: Recent Labs  Lab 06/09/17 1030 06/10/17 0515  WBC 8.3 7.2  HGB 8.8* 9.0*  HCT 27.4* 26.7*  MCV 81.5 79.1*  PLT 284 275    Cardiac Enzymes: No results for input(s): CKTOTAL, TROPONINI in the last 168 hours.  BNP: Invalid input(s): POCBNP  CBG: Recent Labs  Lab 06/10/17 0303 06/10/17 0754 06/10/17 0842 06/10/17 0944 06/10/17 1225  GLUCAP 69 53* 66 118* 145*    Microbiology: No results found for this or any previous visit (from the past 720 hour(s)).   Coagulation Studies: No results for input(s): LABPROT, INR in the last 72 hours.  Urinalysis: Recent Labs    06/09/17 1031  COLORURINE STRAW*  LABSPEC 1.016  PHURINE 6.0  GLUCOSEU >=500*  HGBUR SMALL*  BILIRUBINUR NEGATIVE  KETONESUR 20*  PROTEINUR 100*  NITRITE NEGATIVE  LEUKOCYTESUR NEGATIVE        Imaging: Dg Chest Portable 1 View  Result Date: 06/09/2017 CLINICAL DATA:  Lower extremity edema and dizziness.  Hypertension. EXAM: PORTABLE CHEST 1 VIEW COMPARISON:  None. FINDINGS: Lungs are clear. Heart size and pulmonary vascularity are normal. No pneumothorax. No adenopathy. No bone lesions. IMPRESSION: No edema or consolidation. Electronically Signed   By: Bretta BangWilliam  Woodruff III M.D.   On: 06/09/2017 14:58      Assessment & Plan: Pt is a 31 y.o. African-American   male with long-standing type 1 diabetes with complications of peripheral vascular disease, right above-the-knee amputation, history of  non-STEMI in the past, hypertension, hyperlipidemia, nephrotic proteinuria, was admitted on 06/09/2017 with significant volume overload.   1.  Anasarca - Is likely related to nephrosis.  His serum albumin was 1.1 upon admission.  That is leading to third spacing of the fluid. Patient was given IV Lasix 80 mg yesterday x1.  He had good response .  He is currently being managed with IV Lasix  infusion at low dose of 4 mg/h-  -continue supplementation of IV albumin for oncotic support. -Patient is responding well to this regimen.  Continue for another day - Follow strict low salt diet  2.  Nephrotic proteinuria  - most likely related to poorly controlled diabetes -Hemoglobin A1c was 14.1% in October Serological workup in the past including ANA, ANCA antibodies, complements were negative-  3.  Hypokalemia Replace as necessary  4.  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 ARF with CKD st 2 Baseline creatinine is likely 1.20.  Presenting creatinine was 1.69. Acute kidney injury likely secondary to intravascular volume depletion from acute nausea and vomiting at home

## 2017-06-10 NOTE — Progress Notes (Signed)
Inpatient Diabetes Program Recommendations  AACE/ADA: New Consensus Statement on Inpatient Glycemic Control (2015)  Target Ranges:  Prepandial:   less than 140 mg/dL      Peak postprandial:   less than 180 mg/dL (1-2 hours)      Critically ill patients:  140 - 180 mg/dL   Results for [name], [place]ie (MRN [id number]) as of 06/10/2017 08:07  Ref. Range 06/09/2017 09:54 06/09/2017 16:27 06/09/2017 21:08 06/10/2017 03:03  Glucose-Capillary Latest Ref Range: 65 - 99 mg/dL 782582 (HH) 956432 (H) 213275 (H) 69  Results for [name], [name] (MRN [id number]) as of 06/10/2017 08:07  Ref. Range 06/09/2017 10:30 06/10/2017 05:15  Glucose Latest Ref Range: 65 - 99 mg/dL [id number] [facility](HH) 84   Results for [name], [name] (MRN [id number]) as of 06/10/2017 08:07  Ref. Range 04/14/2017 11:23  Hemoglobin A1C Latest Ref Range: 4.8 - 5.6 % 14.1 (H)   Review of Glycemic Control  Diabetes history: DM1 Outpatient Diabetes medications: Levemir 30 units QHS, Novolog 10 units TID with meals Current orders for Inpatient glycemic control: Levemir 30 units daily, Novolog 0-9 units TID with meals, Novolog 0-5 units QHS, Novolog 5 units TID with meals for meal coverage  Inpatient Diabetes Program Recommendations: Insulin - Basal: Fasting glucose 69 mg/dl this morning at 0:103:03 am. Please consider slightly decreasing Levemir to 27 units daily. HgbA1C: A1C 14.1% on 04/14/17 and patient was seen by an Inpatient Diabetes Coordinator on 04/15/17 (see note for details) during previous admission.  Thanks, Orlando PennerMarie Raekwan Spelman, RN, MSN, CDE Diabetes Coordinator Inpatient Diabetes Program [phone] (Team Pager from 8am to 5pm)

## 2017-06-10 NOTE — Progress Notes (Signed)
Pt states he does not have a legal guardian, he is compatent to make his own decisions

## 2017-06-11 LAB — RENAL FUNCTION PANEL
ANION GAP: 7 (ref 5–15)
Albumin: 1.4 g/dL — ABNORMAL LOW (ref 3.5–5.0)
BUN: 21 mg/dL — ABNORMAL HIGH (ref 6–20)
CO2: 27 mmol/L (ref 22–32)
Calcium: 7.6 mg/dL — ABNORMAL LOW (ref 8.9–10.3)
Chloride: 100 mmol/L — ABNORMAL LOW (ref 101–111)
Creatinine, Ser: 1.25 mg/dL — ABNORMAL HIGH (ref 0.61–1.24)
GFR calc Af Amer: >60 mL/min (ref >60)
GFR calc non Af Amer: >60 mL/min (ref >60)
GLUCOSE: 168 mg/dL — AB (ref 65–99)
POTASSIUM: 3.6 mmol/L (ref 3.5–5.1)
Phosphorus: 3.3 mg/dL (ref 2.5–4.6)
Sodium: 134 mmol/L — ABNORMAL LOW (ref 135–145)

## 2017-06-11 LAB — GLUCOSE, CAPILLARY
GLUCOSE-CAPILLARY: 164 mg/dL — AB (ref 65–99)
Glucose-Capillary: 154 mg/dL — ABNORMAL HIGH (ref 65–99)
Glucose-Capillary: 151 mg/dL — ABNORMAL HIGH (ref 65–99)
Glucose-Capillary: 206 mg/dL — ABNORMAL HIGH (ref 65–99)
Glucose-Capillary: 202 mg/dL — ABNORMAL HIGH (ref 65–99)

## 2017-06-11 LAB — ECHOCARDIOGRAM COMPLETE
HEIGHTINCHES: 66 in
Weight: 3574.4 oz

## 2017-06-11 LAB — BRAIN NATRIURETIC PEPTIDE: B Natriuretic Peptide: 45 pg/mL (ref 0.0–100.0)

## 2017-06-11 LAB — MAGNESIUM: Magnesium: 1.8 mg/dL (ref 1.7–2.4)

## 2017-06-11 NOTE — Progress Notes (Signed)
[facility] [place], [name] 06/11/17  Subjective:   Patient is doing fair Feels like edema is getting better Urine output recorded at 1150 cc Eating without nausea or vomiting  Objective:  Vital signs in last 24 hours:  Temp:  [98.1 F (36.7 C)-98.5 F (36.9 C)] 98.3 F (36.8 C) (12/05 1139) Pulse Rate:  [93-102] 101 (12/05 1139) Resp:  [13-20] 20 (12/05 1139) BP: (119-153)/(64-86) 149/80 (12/05 1139) SpO2:  [99 %-100 %] 100 % (12/05 1139)  Weight change:  Filed Weights   06/09/17 0938 06/09/17 1411 06/09/17 1523  Weight: 91.2 kg (201 lb) 107.8 kg (237 lb 9.6 oz) 101.3 kg (223 lb 6.4 oz)    Intake/Output:    Intake/Output Summary (Last 24 hours) at 06/11/2017 1630 Last data filed at 06/11/2017 1505 Gross per 24 hour  Intake 951.27 ml  Output 1150 ml  Net -198.73 ml     Physical Exam: General:  Laying in the bed, no acute distress  HEENT  anicteric, moist oral mucous membranes  Neck  supple  Pulm/lungs  clear to auscultation bilaterally,    CVS/Heart regular rhythm,   Abdomen:   Soft, nontender, nondistended  Extremities: + Dependent peripheral edema, right above-the-knee amputation  Neurologic:  Alert, oriented  Skin:  Chronic stasis, dry skin          Basic Metabolic Panel:  Recent Labs  Lab 06/09/17 1030 06/10/17 0515 06/10/17 1556 06/11/17 0518  NA 128* 133*  --  134*  K 3.4* 2.6* 3.1* 3.6  CL 93* 96*  --  100*  CO2 21* 28  --  27  GLUCOSE 677* 84  --  168*  BUN 27* 22*  --  21*  CREATININE 1.69* 1.20  --  1.25*  CALCIUM 8.0* 8.1*  --  7.6*  MG 2.0 1.9  --  1.8  PHOS  --   --   --  3.3     CBC: Recent Labs  Lab 06/09/17 1030 06/10/17 0515  WBC 8.3 7.2  HGB 8.8* 9.0*  HCT 27.4* 26.7*  MCV 81.5 79.1*  PLT 284 275      Lab Results  Component Value Date   HEPBSAG Negative 04/15/2017   HEPBSAB Non Reactive 04/15/2017      Microbiology:  No results found for this or any previous visit (from the past 240  hour(s)).  Coagulation Studies: No results for input(s): LABPROT, INR in the last 72 hours.  Urinalysis: Recent Labs    06/09/17 1031  COLORURINE STRAW*  LABSPEC 1.016  PHURINE 6.0  GLUCOSEU >=500*  HGBUR SMALL*  BILIRUBINUR NEGATIVE  KETONESUR 20*  PROTEINUR 100*  NITRITE NEGATIVE  LEUKOCYTESUR NEGATIVE      Imaging: No results found.   Medications:   . albumin human Stopped (06/11/17 0210)  . furosemide (LASIX) infusion 4 mg/hr (06/10/17 1721)   . aspirin EC  81 mg Oral Daily  . atorvastatin  40 mg Oral q1800  . enoxaparin (LOVENOX) injection  40 mg Subcutaneous Q24H  . FLUoxetine  40 mg Oral Daily  . hydrALAZINE  25 mg Oral Q8H  . insulin aspart  0-5 Units Subcutaneous QHS  . insulin aspart  0-9 Units Subcutaneous TID WC  . insulin aspart  5 Units Subcutaneous TID AC  . insulin detemir  30 Units Subcutaneous Daily  . lisinopril  20 mg Oral Daily  . potassium chloride SA  40 mEq Oral BID   acetaminophen **OR** acetaminophen, HYDROcodone-acetaminophen, ondansetron (ZOFRAN) IV, senna-docusate  Assessment/  Plan:  31 y.o. African-American  male with long-standing type 1 diabetes with complications of peripheral vascular disease, right above-the-knee amputation, history of non-STEMI in the past, hypertension, hyperlipidemia, nephrotic proteinuria, was admitted on 06/09/2017 with significant volume overload.   1.  Anasarca - Is likely related to nephrosis.  His serum albumin was 1.1 upon admission.  That is leading to third spacing of the fluid. Patient was given IV Lasix 80 mg  x1.  He had good response .  He is currently being managed with IV Lasix infusion at low dose of 4 mg/h-  -continue supplementation of IV albumin for oncotic support. -Patient is responding well to this regimen.  Continue for another day - Follow strict low salt diet -Consider switching to oral Lasix tomorrow at 40 mg twice a day  2.  Nephrotic proteinuria  - most likely related to poorly  controlled diabetes -Hemoglobin A1c was 14.1% in October Serological workup in the past including ANA, ANCA antibodies, complements were negative-  3.  Hypokalemia Replace as necessary  4.  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 ARF with CKD st 2 Baseline creatinine is likely 1.20.  Presenting creatinine was 1.69. Acute kidney injury likely secondary to intravascular volume depletion from acute nausea and vomiting at home     LOS: 2 [facility],Sarabelle Genson 12/5/20184:30 PM  [facility] [place], [name] [phone]

## 2017-06-11 NOTE — Progress Notes (Signed)
[facility] [place] [facility]   PATIENT NAME: [name]    MR#:  [id number]  DATE OF BIRTH:  08/01/1985  SUBJECTIVE:   Patient still reports shortness of breath Lower extremity edema is improving  REVIEW OF SYSTEMS:    Review of Systems  Constitutional: Negative for fever, chills weight loss HENT: Negative for ear pain, nosebleeds, congestion, facial swelling, rhinorrhea, neck pain, neck stiffness and ear discharge.   Respiratory: Negative for cough, ++shortness of breath, nowheezing  Cardiovascular: Negative for chest pain, palpitations and positive leg swelling.  Gastrointestinal: Negative for heartburn, abdominal pain, vomiting, diarrhea or consitpation Genitourinary: Negative for dysuria, urgency, frequency, hematuria Musculoskeletal: Negative for back pain or joint pain Neurological: Negative for dizziness, seizures, syncope, focal weakness,  numbness and headaches.  Hematological: Does not bruise/bleed easily.  Psychiatric/Behavioral: Negative for hallucinations, confusion, dysphoric mood    Tolerating Diet: yes      DRUG ALLERGIES:  No Known Allergies  VITALS:  Blood pressure (!) 153/86, pulse 93, temperature 98.1 F (36.7 C), temperature source Oral, resp. rate 13, height 5\' 6"  (1.676 m), weight 101.3 kg (223 lb 6.4 oz), SpO2 100 %.  PHYSICAL EXAMINATION:  Constitutional: Appears well-developed and well-nourished. No distress. HENT: Normocephalic. Marland Kitchen. Oropharynx is clear and moist.  Eyes: Conjunctivae and EOM are normal. PERRLA, no scleral icterus.  Neck: Normal ROM. Neck supple. No JVD. No tracheal deviation. CVS: RRR, S1/S2 +, no murmurs, no gallops, no carotid bruit.  Pulmonary: Effort and breath sounds normal, no stridor, rhonchi, wheezes, rales.  Abdominal: Soft. BS +,  no distension, tenderness, rebound or guarding.  Musculoskeletal:right AKA 1++LEE and no tenderness.  Neuro: Alert. CN 2-12 grossly intact. No focal  deficits. Skin: Skin is warm he has dry skin. No rash noted. Psychiatric: Normal mood and affect.      LABORATORY PANEL:   CBC Recent Labs  Lab 06/10/17 0515  WBC 7.2  HGB 9.0*  HCT 26.7*  PLT 275   ------------------------------------------------------------------------------------------------------------------  Chemistries  Recent Labs  Lab 06/10/17 0515  06/11/17 0518  NA 133*  --  134*  K 2.6*   < > 3.6  CL 96*  --  100*  CO2 28  --  27  GLUCOSE 84  --  168*  BUN 22*  --  21*  CREATININE 1.20  --  1.25*  CALCIUM 8.1*  --  7.6*  MG 1.9  --  1.8  AST 28  --   --   ALT 15*  --   --   ALKPHOS 68  --   --   BILITOT 0.4  --   --    < > = values in this interval not displayed.   ------------------------------------------------------------------------------------------------------------------  Cardiac Enzymes No results for input(s): TROPONINI in the last 168 hours. ------------------------------------------------------------------------------------------------------------------  RADIOLOGY:  Dg Chest Portable 1 View  Result Date: 06/09/2017 CLINICAL DATA:  Lower extremity edema and dizziness.  Hypertension. EXAM: PORTABLE CHEST 1 VIEW COMPARISON:  None. FINDINGS: Lungs are clear. Heart size and pulmonary vascularity are normal. No pneumothorax. No adenopathy. No bone lesions. IMPRESSION: No edema or consolidation. Electronically Signed   By: Bretta BangWilliam  Woodruff III M.D.   On: 06/09/2017 14:58     ASSESSMENT AND PLAN:   31 year old male with long-standing type 1 diabetes status post right AKA, CAD and nephrotic proteinuria who presented with volume overload.  1. Anasarca: This is related to nephrosis/nephrotic syndrome Continue Lasix drip as per recommendations by nephrology Continue supplementation of IV  albumin for oncotic support Continue low-salt diet Continue lisinopril 2. Diabetes: Continue Levemir with sliding scale and ADA diet  3. Hypokalemia: This  is being repleted when necessary    Management plans discussed with the patient and he is in agreement.  CODE STATUS: full  TOTAL TIME TAKING CARE OF THIS PATIENT: 26 minutes.     POSSIBLE D/C 1-2 days, DEPENDING ON CLINICAL CONDITION.   Denyse Fillion M.D on 06/11/2017 at 11:18 AM  Between 7am to 6pm - Pager - [phone] After 6pm go to [email or web address] - password [email or web address]  [facility] [place] [facility]  Office  [phone]  CC: Primary care physician; [name], [name], MD  Note: This dictation was prepared with Dragon dictation along with smaller phrase technology. Any transcriptional errors that result from this process are unintentional.

## 2017-06-11 NOTE — Progress Notes (Signed)
Pharmacy Electrolyte Monitoring Consult:  Pharmacy consulted to assist in monitoring and replacing electrolytes in this 31 y.o. male admitted on 06/09/2017 with Dizziness and Emesis   Labs:  Sodium (mmol/L)  Date Value  06/11/2017 134 (L)   Potassium (mmol/L)  Date Value  06/11/2017 3.6   Magnesium (mg/dL)  Date Value  16/10/960412/11/2016 1.8   Phosphorus (mg/dL)  Date Value  54/09/811912/11/2016 3.3   Calcium (mg/dL)  Date Value  14/78/295612/11/2016 7.6 (L)   Albumin (g/dL)  Date Value  21/30/865712/11/2016 1.4 (L)   K up to 3.1, Mg WNL. It appears as though pt only received 1 run of 10 MEQ IV and a total of 80 MEQ po. Of note pt has been restarted on lasix drip  Plan: Pt already has a order for 40 MEQ at 2200. Will continue with this order but will also add another 20 MEQ once now and recheck in the AM  06/11/17 0515 K 3.6, Mg 1.8, phos 3.3. The order for Klor-Con 40 mEq po BID will remain active. No further supplement required at this time. Will recheck electrolytes tomorrow with AM labs.  Barba Solt A. Benton[place]son, [place].D, BCPS Clinical Pharmacist 06/11/2017 7:11 AM

## 2017-06-12 LAB — PHOSPHORUS: PHOSPHORUS: 3.4 mg/dL (ref 2.5–4.6)

## 2017-06-12 LAB — GLUCOSE, CAPILLARY
GLUCOSE-CAPILLARY: 72 mg/dL (ref 65–99)
GLUCOSE-CAPILLARY: 155 mg/dL — AB (ref 65–99)
Glucose-Capillary: 128 mg/dL — ABNORMAL HIGH (ref 65–99)

## 2017-06-12 LAB — BASIC METABOLIC PANEL
Anion gap: 6 (ref 5–15)
BUN: 32 mg/dL — AB (ref 6–20)
CHLORIDE: 100 mmol/L — AB (ref 101–111)
CO2: 29 mmol/L (ref 22–32)
Calcium: 8 mg/dL — ABNORMAL LOW (ref 8.9–10.3)
Creatinine, Ser: 1.35 mg/dL — ABNORMAL HIGH (ref 0.61–1.24)
GFR calc non Af Amer: >60 mL/min (ref >60)
Glucose, Bld: 191 mg/dL — ABNORMAL HIGH (ref 65–99)
POTASSIUM: 3.9 mmol/L (ref 3.5–5.1)
SODIUM: 135 mmol/L (ref 135–145)

## 2017-06-12 LAB — MAGNESIUM: MAGNESIUM: 1.8 mg/dL (ref 1.7–2.4)

## 2017-06-12 MED ORDER — FUROSEMIDE 40 MG PO TABS: 40.0000 mg | ORAL_TABLET | Freq: Two times a day (BID) | ORAL | 0 refills | Status: DC

## 2017-06-12 MED ORDER — ASPIRIN EC 81 MG PO TBEC: 81.0000 mg | DELAYED_RELEASE_TABLET | Freq: Every day | ORAL | 2 refills | Status: AC

## 2017-06-12 MED ORDER — POTASSIUM CHLORIDE CRYS ER 20 MEQ PO TBCR: 40.0000 meq | EXTENDED_RELEASE_TABLET | Freq: Two times a day (BID) | ORAL | 0 refills | Status: DC

## 2017-06-12 MED ORDER — ATORVASTATIN CALCIUM 40 MG PO TABS: 40.0000 mg | ORAL_TABLET | Freq: Every day | ORAL | 0 refills | Status: DC

## 2017-06-12 MED ORDER — LISINOPRIL 20 MG PO TABS: 20.0000 mg | ORAL_TABLET | Freq: Every day | ORAL | 0 refills | Status: DC

## 2017-06-12 MED ORDER — HYDRALAZINE HCL 25 MG PO TABS: 25.0000 mg | ORAL_TABLET | Freq: Three times a day (TID) | ORAL | 0 refills | Status: DC

## 2017-06-12 NOTE — Progress Notes (Signed)
Pt. Left foot. Appeared to be moist between toes with possible MASD. Yesterday during shift removed socked and provided skin care via lotion to the foot. Upon discussing with pt. Pt agreed to allow the sock to remain off due to excess moisture. Upon assessment today area between toes were dry and skin appeared less dry, skin flakes did not slough off with handling of foot during assessment. With discussion with pt. Plan to leave sock off until third spacing and fluid no longer weeping.

## 2017-06-12 NOTE — Care Management Note (Signed)
Case Management Note  Patient Details  Name: [name] MRN: [id number] Date of Birth: 11/12/1985  Subjective/Objective:                  [facility] with the diagnosis of Anasarca. Lives with a friend. Brother is [place] [phone]). [facility] and sees Dr. Hessie DienerBender. States it has been a while since [facility] has [facility]. [facility] with [facility]. Never been to [facility]. States [facility] his strips [facility] [employer].  Takes care of all basic activities of daily living himself.  Discharge to home today per Dr. Juliene PinaMody. A friend or brother will transport.  Action/Plan: [facility], [facility]. Encouraged to go by [facility] for prescription re-fills.  An appointment at [facility] will be arranged prior to leaving hospital   Expected Discharge Date:  06/12/17               Expected Discharge Plan:     In-House Referral:   yes  Discharge planning Services     Post Acute Care Choice:    Choice offered to:     DME Arranged:    DME Agency:     HH Arranged:    HH Agency:     Status of Service:     If discussed at [employer] Meetings, dates discussed:    Additional Comments:  Gwenette GreetBrenda S Leyani Gargus, RN MSN CCM Care Management [phone] 06/12/2017, 8:41 AM

## 2017-06-12 NOTE — Plan of Care (Signed)
  Progressing Education: Knowledge of General Education information will improve 06/12/2017 1232 - Progressing by Luretha MurphyMiles, Asaiah Hunnicutt, RN Health Behavior/Discharge Planning: Ability to manage health-related needs will improve 06/12/2017 1232 - Progressing by Luretha MurphyMiles, Tynisa Vohs, RN Clinical Measurements: Ability to maintain clinical measurements within normal limits will improve 06/12/2017 1232 - Progressing by Luretha MurphyMiles, Silena Wyss, RN Will remain free from infection 06/12/2017 1232 - Progressing by Luretha MurphyMiles, Sedra Morfin, RN Diagnostic test results will improve 06/12/2017 1232 - Progressing by Luretha MurphyMiles, Neeley Sedivy, RN Cardiovascular complication will be avoided 06/12/2017 1232 - Progressing by Luretha MurphyMiles, Hlee Fringer, RN

## 2017-06-12 NOTE — Progress Notes (Signed)
Pt for discharge home. Alert. No resp distress.  Ate 100% meal rechecked bs. 155.  dischsrge instructions discussed with pt. presc given and discussed and home meds. Diet /activity and f/u discussed. Pt verbalized understanding of discharge  Plans. Assisted pt to dress and put leg  Protheses on.  Out via w/c to home with friends w/o c/o.

## 2017-06-12 NOTE — Progress Notes (Signed)
Pt for discharge home. Alert. No resp distress.  Some sob with increased exertion pt told to take it easy and rest.  Instructions discussed with pt. presc given for neb solution per md and other meds sent in to pts pharmacy.  meds discussed / diet / activity and f/u appt discussed. Pt says he  Understands discharge plans. Wheeled out by me via wheelchair   W/o c/o.  Friend picked pt up.

## 2017-06-12 NOTE — Discharge Summary (Signed)
[facility] - [place] at [facility]   PATIENT NAME: Henry DyerDarrick Fahrner    MR#:  [id number]  DATE OF BIRTH:  09/15/1985  DATE OF ADMISSION:  06/09/2017 ADMITTING PHYSICIAN: Ramonita LabAruna Gouru, MD  DATE OF DISCHARGE: 06/12/2017  PRIMARY CARE PHYSICIAN: [name], [name], MD    ADMISSION DIAGNOSIS:  Anasarca [R60.1] Hypoalbuminemia [E88.09] Hyperglycemia [R73.9]  DISCHARGE DIAGNOSIS:  Active Problems:   Anasarca   SECONDARY DIAGNOSIS:   Past Medical History:  Diagnosis Date  . Abscess of groin, right 09/09/2016  . 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 Acute gastroenteritis 09/11/2016  . AKI (acute kidney injury) (HCC) 02/19/2013  . Anemia 12/14/2015  . Dehydration 09/11/2016  . Diabetes mellitus without complication (HCC)   . Diabetic acidosis without coma (HCC) 06/01/2016  . Elevated bilirubin 09/11/2016  . Essential hypertension with goal blood pressure less than 130/80 02/07/2015  . Hypercholesterolemia 02/25/2013  . Hyponatremia 09/11/2016  . Hypophosphatemia 12/14/2015  . Hypovitaminosis D 12/16/2015  . Mass of right inguinal region   . Microcytic anemia 11/17/2014  . Non-ST elevation myocardial infarction (NSTEMI) (HCC) 02/19/2013  . Poorly controlled type 1 diabetes mellitus (HCC) 03/03/2012   Overview:  Diagnosed 02/2012.  Now insulin-dependent  . Proteinuria 12/14/2015    HOSPITAL COURSE:   31 year old male with long-standing type 1 diabetes status post right AKA, CAD and nephrotic proteinuria who presented with volume overload.  1. Anasarca: This is related to nephrosis/nephrotic syndrome He was on Lasix drip as well as albumin from cardiac support. Anasarca has much improved. Recommendations by nephrology were to discharge on oral Lasix 40 Miller's pill twice a day with potassium supplementation.  He will Continue lisinopril for renal protection. 2. Diabetes: Continue Levemir with ADA diet. He did see the dietitian while he was in the  hospital  3. Hypokalemia: This is being repleted when necessary  4. HTN: Blood pressure is better controlled. He would not close outpatient follow-up regarding his blood pressure. He will continue hydralazine and lisinopril DISCHARGE CONDITIONS AND DIET:    stable for discharge on heart healthy diabetic diet  CONSULTS OBTAINED:  Treatment Team:  Mosetta PigeonSingh, Harmeet, MD  DRUG ALLERGIES:  No Known Allergies  DISCHARGE MEDICATIONS:   Allergies as of 06/12/2017   No Known Allergies     Medication List    STOP taking these medications   lisinopril-hydrochlorothiazide 20-12.5 MG tablet Commonly known as:  PRINZIDE,ZESTORETIC   rosuvastatin 10 MG tablet Commonly known as:  CRESTOR     TAKE these medications   aspirin EC 81 MG tablet Take 1 tablet (81 mg total) by mouth daily.   atorvastatin 40 MG tablet Commonly known as:  LIPITOR Take 1 tablet (40 mg total) by mouth daily at 6 PM.   FLUoxetine 40 MG capsule Commonly known as:  PROZAC Take 40 mg by mouth daily.   furosemide 40 MG tablet Commonly known as:  LASIX Take 1 tablet (40 mg total) by mouth 2 (two) times daily.   hydrALAZINE 25 MG tablet Commonly known as:  APRESOLINE Take 1 tablet (25 mg total) by mouth every 8 (eight) hours.   insulin aspart 100 UNIT/ML injection Commonly known as:  novoLOG Inject 5 Units into the skin 3 (three) times daily before meals. What changed:    how much to take  when to take this   insulin detemir 100 UNIT/ML injection Commonly known as:  LEVEMIR Inject 30 Units into the skin at bedtime.   lisinopril 20  MG tablet Commonly known as:  PRINIVIL,ZESTRIL Take 1 tablet (20 mg total) by mouth daily.   potassium chloride SA 20 MEQ tablet Commonly known as:  K-DUR,KLOR-CON Take 2 tablets (40 mEq total) by mouth 2 (two) times daily.         Today   CHIEF COMPLAINT:  Doing well this morning. No shortness of breath. Lower extremity edema has improved   VITAL SIGNS:   Blood pressure (!) 168/96, pulse 98, temperature 97.8 F (36.6 C), temperature source Oral, resp. rate 14, height 5\' 6"  (1.676 m), weight 101.3 kg (223 lb 6.4 oz), SpO2 100 %.   REVIEW OF SYSTEMS:  Review of Systems  Constitutional: Negative.  Negative for chills, fever and malaise/fatigue.  HENT: Negative.  Negative for ear discharge, ear pain, hearing loss, nosebleeds and sore throat.   Eyes: Negative.  Negative for blurred vision and pain.  Respiratory: Negative.  Negative for cough, hemoptysis, shortness of breath and wheezing.   Cardiovascular: Negative.  Negative for chest pain, palpitations and leg swelling.  Gastrointestinal: Negative.  Negative for abdominal pain, blood in stool, diarrhea, nausea and vomiting.  Genitourinary: Negative.  Negative for dysuria.  Musculoskeletal: Negative.  Negative for back pain.  Skin: Negative.   Neurological: Negative for dizziness, tremors, speech change, focal weakness, seizures and headaches.  Endo/Heme/Allergies: Negative.  Does not bruise/bleed easily.  Psychiatric/Behavioral: Negative.  Negative for depression, hallucinations and suicidal ideas.     PHYSICAL EXAMINATION:  Constitutional: Appears well-developed and well-nourished. No distress. HENT: Normocephalic. Marland Kitchen Oropharynx is clear and moist.  Eyes: Conjunctivae and EOM are normal. PERRLA, no scleral icterus.  Neck: Normal ROM. Neck supple. No JVD. No tracheal deviation. CVS: RRR, S1/S2 +, no murmurs, no gallops, no carotid bruit.  Pulmonary: Effort and breath sounds normal, no stridor, rhonchi, wheezes, rales.  Abdominal: Soft. BS +,  no distension, tenderness, rebound or guarding.  Musculoskeletal:right AKA 1++LEE and no tenderness.  Neuro: Alert. CN 2-12 grossly intact. No focal deficits. Skin: Skin is warm he has dry skin. No rash noted. Psychiatric: Normal mood and affect.    DATA REVIEW:   CBC Recent Labs  Lab 06/10/17 0515  WBC 7.2  HGB 9.0*  HCT 26.7*  PLT  275    Chemistries  Recent Labs  Lab 06/10/17 0515  06/12/17 0522  NA 133*   < > 135  K 2.6*   < > 3.9  CL 96*   < > 100*  CO2 28   < > 29  GLUCOSE 84   < > 191*  BUN 22*   < > 32*  CREATININE 1.20   < > 1.35*  CALCIUM 8.1*   < > 8.0*  MG 1.9   < > 1.8  AST 28  --   --   ALT 15*  --   --   ALKPHOS 68  --   --   BILITOT 0.4  --   --    < > = values in this interval not displayed.    Cardiac Enzymes No results for input(s): TROPONINI in the last 168 hours.  Microbiology Results  @MICRORSLT48 @  RADIOLOGY:  No results found.    Allergies as of 06/12/2017   No Known Allergies     Medication List    STOP taking these medications   lisinopril-hydrochlorothiazide 20-12.5 MG tablet Commonly known as:  PRINZIDE,ZESTORETIC   rosuvastatin 10 MG tablet Commonly known as:  CRESTOR     TAKE these medications   aspirin EC  81 MG tablet Take 1 tablet (81 mg total) by mouth daily.   atorvastatin 40 MG tablet Commonly known as:  LIPITOR Take 1 tablet (40 mg total) by mouth daily at 6 PM.   FLUoxetine 40 MG capsule Commonly known as:  PROZAC Take 40 mg by mouth daily.   furosemide 40 MG tablet Commonly known as:  LASIX Take 1 tablet (40 mg total) by mouth 2 (two) times daily.   hydrALAZINE 25 MG tablet Commonly known as:  APRESOLINE Take 1 tablet (25 mg total) by mouth every 8 (eight) hours.   insulin aspart 100 UNIT/ML injection Commonly known as:  novoLOG Inject 5 Units into the skin 3 (three) times daily before meals. What changed:    how much to take  when to take this   insulin detemir 100 UNIT/ML injection Commonly known as:  LEVEMIR Inject 30 Units into the skin at bedtime.   lisinopril 20 MG tablet Commonly known as:  PRINIVIL,ZESTRIL Take 1 tablet (20 mg total) by mouth daily.   potassium chloride SA 20 MEQ tablet Commonly known as:  K-DUR,KLOR-CON Take 2 tablets (40 mEq total) by mouth 2 (two) times daily.          Management plans  discussed with the patient and he is in agreement. Stable for discharge home  Patient should follow up with dr Thedore Minssingh  CODE STATUS:     Code Status Orders  (From admission, onward)        Start     Ordered   06/09/17 1523  Full code  Continuous     06/09/17 1522    Code Status History    Date Active Date Inactive Code Status Order ID Comments User Context   04/14/2017 14:05 04/18/2017 20:05 Full Code [id number]  Shaune Pollackhen, Qing, MD Inpatient   09/09/2016 19:55 09/11/2016 18:59 Full Code [id number]  Altamese DillingVachhani, Vaibhavkumar, MD Inpatient   06/01/2016 19:26 06/02/2016 18:35 Full Code [id number]  Ramonita LabGouru, Aruna, MD Inpatient      TOTAL TIME TAKING CARE OF THIS PATIENT: 38 minutes.    Note: This dictation was prepared with Dragon dictation along with smaller phrase technology. Any transcriptional errors that result from this process are unintentional.  Mescal Flinchbaugh M.D on 06/12/2017 at 7:49 AM  Between 7am to 6pm - Pager - [phone] After 6pm go to [email or web address] - Social research officer, governmentpassword [email or web address]  [facility] [place] [facility]  Office  [phone]  CC: Primary care physician; [name], [name], MD

## 2017-06-12 NOTE — Progress Notes (Signed)
[name], [name] 06/12/17  Subjective:   Patient is doing fair Feels like edema is getting better Urine output recorded at 5800 cc Eating without nausea or vomiting  Objective:  Vital signs in last 24 hours:  Temp:  [97.8 F (36.6 C)-98.3 F (36.8 C)] 97.8 F (36.6 C) (12/06 0428) Pulse Rate:  [98-101] 98 (12/06 0428) Resp:  [14-20] 14 (12/06 0428) BP: (149-168)/(80-96) 168/96 (12/06 0428) SpO2:  [100 %] 100 % (12/06 0428)  Weight change:  Filed Weights   06/09/17 0938 06/09/17 1411 06/09/17 1523  Weight: 91.2 kg (201 lb) 107.8 kg (237 lb 9.6 oz) 101.3 kg (223 lb 6.4 oz)    Intake/Output:    Intake/Output Summary (Last 24 hours) at 06/12/2017 1055 Last data filed at 06/12/2017 1029 Gross per 24 hour  Intake 720 ml  Output 5800 ml  Net -5080 ml     Physical Exam: General:  Laying in the bed, no acute distress  HEENT  anicteric, moist oral mucous membranes  Neck  supple  Pulm/lungs  clear to auscultation bilaterally,    CVS/Heart regular rhythm,   Abdomen:   Soft, nontender, nondistended  Extremities: + Dependent peripheral edema, right above-the-knee amputation  Neurologic:  Alert, oriented  Skin:  Chronic stasis, dry skin          Basic Metabolic Panel:  Recent Labs  Lab 06/09/17 1030 06/10/17 0515 06/10/17 1556 06/11/17 0518 06/12/17 0522  NA 128* 133*  --  134* 135  K 3.4* 2.6* 3.1* 3.6 3.9  CL 93* 96*  --  100* 100*  CO2 21* 28  --  27 29  GLUCOSE 677* 84  --  168* 191*  BUN 27* 22*  --  21* 32*  CREATININE 1.69* 1.20  --  1.25* 1.35*  CALCIUM 8.0* 8.1*  --  7.6* 8.0*  MG 2.0 1.9  --  1.8 1.8  PHOS  --   --   --  3.3 3.4     CBC: Recent Labs  Lab 06/09/17 1030 06/10/17 0515  WBC 8.3 7.2  HGB 8.8* 9.0*  HCT 27.4* 26.7*  MCV 81.5 79.1*  PLT 284 275      Lab Results  Component Value Date   HEPBSAG Negative 04/15/2017   HEPBSAB Non Reactive 04/15/2017      Microbiology:  No results found for  this or any previous visit (from the past 240 hour(s)).  Coagulation Studies: No results for input(s): LABPROT, INR in the last 72 hours.  Urinalysis: No results for input(s): COLORURINE, LABSPEC, PHURINE, GLUCOSEU, HGBUR, BILIRUBINUR, KETONESUR, PROTEINUR, UROBILINOGEN, NITRITE, LEUKOCYTESUR in the last 72 hours.  Invalid input(s): APPERANCEUR    Imaging: No results found.   Medications:   . furosemide (LASIX) infusion 4 mg/hr (06/10/17 1721)   . aspirin EC  81 mg Oral Daily  . atorvastatin  40 mg Oral q1800  . enoxaparin (LOVENOX) injection  40 mg Subcutaneous Q24H  . FLUoxetine  40 mg Oral Daily  . hydrALAZINE  25 mg Oral Q8H  . insulin aspart  0-5 Units Subcutaneous QHS  . insulin aspart  0-9 Units Subcutaneous TID WC  . insulin aspart  5 Units Subcutaneous TID AC  . insulin detemir  30 Units Subcutaneous Daily  . lisinopril  20 mg Oral Daily  . potassium chloride SA  40 mEq Oral BID   acetaminophen **OR** acetaminophen, HYDROcodone-acetaminophen, ondansetron (ZOFRAN) IV, senna-docusate  Assessment/ Plan:  31 y.o. African-American  male with long-standing type 1  diabetes with complications of peripheral vascular disease, right above-the-knee amputation, history of non-STEMI in the past, hypertension, hyperlipidemia, nephrotic proteinuria, was admitted on 06/09/2017 with significant volume overload.   1.  Anasarca  -  secondary to hypoalbuminemia from nephrotic syndrome.  He responded well to IV Lasix and IV albumin administration -Consider switching to oral Lasix at 40 mg twice a day - Patient to follow-up with him outpatient.  Appointment scheduled for December 13 at 2 PM  2.  Nephrotic proteinuria  - most likely related to poorly controlled diabetes -Hemoglobin A1c was 14.1% in October Serological workup in the past including ANA, ANCA antibodies, complements were negative-  3.  Hypokalemia Replace as necessary  4.  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 ARF with CKD st 2 Baseline creatinine is likely 1.20.  Presenting creatinine was 1.69. Acute kidney injury likely secondary to intravascular volume depletion from acute nausea and vomiting at home     LOS: 3 [name],[name] 12/6/201810:55 AM  [name], [name] [phone]

## 2017-06-12 NOTE — Progress Notes (Signed)
Pharmacy Electrolyte Monitoring Consult:  Pharmacy consulted to assist in monitoring and replacing electrolytes in this 31 y.o. male admitted on 06/09/2017 with Dizziness and Emesis   Labs:  Sodium (mmol/L)  Date Value  06/12/2017 135   Potassium (mmol/L)  Date Value  06/12/2017 3.9   Magnesium (mg/dL)  Date Value  16/10/960412/12/2016 1.8   Phosphorus (mg/dL)  Date Value  54/09/811912/12/2016 3.4   Calcium (mg/dL)  Date Value  14/78/295612/12/2016 8.0 (L)   Albumin (g/dL)  Date Value  21/30/865712/11/2016 1.4 (L)   K up to 3.1, Mg WNL. It appears as though pt only received 1 run of 10 MEQ IV and a total of 80 MEQ po. Of note pt has been restarted on lasix drip  Plan: Pt already has a order for 40 MEQ at 2200. Will continue with this order but will also add another 20 MEQ once now and recheck in the AM  06/11/17 0515 K 3.6, Mg 1.8, phos 3.3. The order for Klor-Con 40 mEq po BID will remain active. No further supplement required at this time. Will recheck electrolytes tomorrow with AM labs.  06/12/17 0522 K 3.9, Mg 1.8, phos 3.4. The order for Klor-Con 40 mEq po BID will remain active. No further supplement required at this time. Will recheck electrolytes tomorrow with AM labs since patient remains on Lasix IV infusion.  Brenen Beigel A. [place], [place], BCPS Clinical Pharmacist 06/12/2017 7:23 AM

## 2017-06-12 NOTE — Progress Notes (Signed)
Inpatient Diabetes Program Recommendations  AACE/ADA: New Consensus Statement on Inpatient Glycemic Control (2015)  Target Ranges:  Prepandial:   less than 140 mg/dL      Peak postprandial:   less than 180 mg/dL (1-2 hours)      Critically ill patients:  140 - 180 mg/dL   Results for Hinton DyerORAIN, Lotus (MRN [id number]) as of 06/12/2017 07:26  Ref. Range 06/11/2017 04:31 06/11/2017 07:56 06/11/2017 11:43 06/11/2017 16:29 06/11/2017 20:42  Glucose-Capillary Latest Ref Range: 65 - 99 mg/dL 409154 (H) 811151 (H) 914164 (H) 206 (H) 202 (H)  Results for Hinton DyerORAIN, Brysun (MRN [id number]) as of 06/12/2017 07:26  Ref. Range 04/16/2017 06:54 06/10/2017 05:15  Hemoglobin A1C Latest Ref Range: 4.8 - 5.6 % 14.1 (H) 14.1 (H)   Review of Glycemic Control  Diabetes history: DM1 Outpatient Diabetes medications: Levemir 30 units QHS, Novolog 10 units TID with meals Current orders for Inpatient glycemic control: Levemir 30 units daily, Novolog 0-9 units TID with meals, Novolog 0-5 units QHS, Novolog 5 units TID with meals for meal coverage  Inpatient Diabetes Program Recommendations:  Insulin-Meal Coverage: Please consider increasing meal coverage to Novolog 7 units TID with meals. HgbA1C: A1C 14.1% on 04/14/17 and still 14.1% on 06/10/17.  Patient was seen by an Inpatient Diabetes Coordinator on 04/15/17 (see note for details) during previous admission.   Thanks, Orlando PennerMarie Enes Wegener, RN, MSN, CDE Diabetes Coordinator Inpatient Diabetes Program [phone] (Team Pager from 8am to 5pm)

## 2017-06-12 NOTE — Discharge Instructions (Signed)
[facility] Failure [facility] appointment on June 23 2017 at 2:40pm with Henry Kindredina Jeter Tomey, FNP. Please call [phone] to reschedule.

## 2017-06-12 NOTE — Plan of Care (Signed)
Pt. Continue to progress.

## 2017-06-23 ENCOUNTER — Telehealth: Payer: Self-pay | Admitting: Family

## 2017-06-23 ENCOUNTER — Ambulatory Visit: Payer: Self-pay | Admitting: Family

## 2017-06-23 NOTE — Progress Notes (Deleted)
   Patient ID: [name], male    DOB: 02/13/1986, 31 y.o.   MRN: [id number]  HPI  [name] is a 31 y/o male with a history of  Echo report from 06/10/17 showed an EF of 55-60% along with mild Henry and normal PA pressure.  Admitted 06/09/17 due to anasarca due to nephrotic syndrome. Initially needed IV diuretics and albumin. Nephrology consult was obtained. Medications were adjusted and he was discharged home after 3 days. Admitted 04/14/17 due to anasarca due to nephrotic syndrome. Initially was on a lasix drip along with fluid restriction of 1L daily. Lower extremity wound was noted so wound care consult was obtained. He was discharged home after 4 days. Was in the ED 03/24/17 due to edema where he was treated and released.   He presents today for his initial visit with a chief complaint of   Review of Systems    Physical Exam  Assessment & Plan:  1: Chronic heart failure with preserved ejection fraction- - NYHA class  2: HTN- - BMP from 06/12/17 reviewed and showed sodium 135, potassium 3.9 and GFR >60  3: Diabetes- - A1c from 06/10/17 reviewed and was 14.1% - saw endocrinologist Henry Knife([place]) 08/15/15

## 2017-06-23 NOTE — Telephone Encounter (Signed)
Patient missed his initial appointment at the Heart Failure Clinic on 06/23/17. Will attempt to reschedule.

## 2017-07-17 ENCOUNTER — Emergency Department
Admission: EM | Admit: 2017-07-17 | Discharge: 2017-07-17 | Disposition: A | Discharge status: Home / Self Care | Payer: Medicaid Other | Attending: Emergency Medicine | Admitting: Emergency Medicine

## 2017-07-17 ENCOUNTER — Other Ambulatory Visit: Payer: Self-pay

## 2017-07-17 ENCOUNTER — Encounter: Payer: Self-pay | Admitting: Emergency Medicine

## 2017-07-17 DIAGNOSIS — Z794 Long term (current) use of insulin: Secondary | ICD-10-CM | POA: Diagnosis not present

## 2017-07-17 DIAGNOSIS — Z79899 Other long term (current) drug therapy: Secondary | ICD-10-CM | POA: Insufficient documentation

## 2017-07-17 DIAGNOSIS — E1165 Type 2 diabetes mellitus with hyperglycemia: Secondary | ICD-10-CM | POA: Diagnosis not present

## 2017-07-17 DIAGNOSIS — R601 Generalized edema: Secondary | ICD-10-CM | POA: Diagnosis not present

## 2017-07-17 DIAGNOSIS — I252 Old myocardial infarction: Secondary | ICD-10-CM | POA: Diagnosis not present

## 2017-07-17 DIAGNOSIS — Z7982 Long term (current) use of aspirin: Secondary | ICD-10-CM | POA: Diagnosis not present

## 2017-07-17 DIAGNOSIS — R739 Hyperglycemia, unspecified: Secondary | ICD-10-CM

## 2017-07-17 LAB — COMPREHENSIVE METABOLIC PANEL
ALBUMIN: 1.4 g/dL — AB (ref 3.5–5.0)
ALK PHOS: 292 U/L — AB (ref 38–126)
ALT: 29 U/L (ref 17–63)
ANION GAP: 8 (ref 5–15)
AST: 52 U/L — AB (ref 15–41)
BILIRUBIN TOTAL: 0.6 mg/dL (ref 0.3–1.2)
BUN: 28 mg/dL — AB (ref 6–20)
CO2: 25 mmol/L (ref 22–32)
Calcium: 7.9 mg/dL — ABNORMAL LOW (ref 8.9–10.3)
Chloride: 97 mmol/L — ABNORMAL LOW (ref 101–111)
Creatinine, Ser: 1.58 mg/dL — ABNORMAL HIGH (ref 0.61–1.24)
GFR calc Af Amer: >60 mL/min (ref >60)
GFR calc non Af Amer: 56 mL/min — ABNORMAL LOW (ref >60)
Glucose, Bld: 600 mg/dL (ref 65–99)
POTASSIUM: 4.2 mmol/L (ref 3.5–5.1)
SODIUM: 130 mmol/L — AB (ref 135–145)
TOTAL PROTEIN: 5.6 g/dL — AB (ref 6.5–8.1)

## 2017-07-17 LAB — CBC WITH DIFFERENTIAL/PLATELET
BASOS ABS: 0.1 10*3/uL (ref 0–0.1)
BASOS PCT: 1 %
EOS ABS: 0.1 10*3/uL (ref 0–0.7)
Eosinophils Relative: 1 %
HEMATOCRIT: 31.2 % — AB (ref 40.0–52.0)
HEMOGLOBIN: 10.3 g/dL — AB (ref 13.0–18.0)
Lymphocytes Relative: 22 %
Lymphs Abs: 1.1 10*3/uL (ref 1.0–3.6)
MCH: 26.7 pg (ref 26.0–34.0)
MCHC: 33.1 g/dL (ref 32.0–36.0)
MCV: 80.4 fL (ref 80.0–100.0)
Monocytes Absolute: 0.3 10*3/uL (ref 0.2–1.0)
Monocytes Relative: 6 %
NEUTROS ABS: 3.4 10*3/uL (ref 1.4–6.5)
NEUTROS PCT: 70 %
Platelets: 345 10*3/uL (ref 150–440)
RBC: 3.88 MIL/uL — ABNORMAL LOW (ref 4.40–5.90)
RDW: 14.6 % — AB (ref 11.5–14.5)
WBC: 4.9 10*3/uL (ref 3.8–10.6)

## 2017-07-17 MED ORDER — INSULIN ASPART 100 UNIT/ML ~~LOC~~ SOLN
10.0000 [IU] | Freq: Once | SUBCUTANEOUS | Status: AC
  Administered 2017-07-17: 10 [IU] via INTRAVENOUS
  Filled 2017-07-17: qty 1

## 2017-07-17 MED ORDER — FUROSEMIDE 10 MG/ML IJ SOLN
40.0000 mg | Freq: Once | INTRAMUSCULAR | Status: AC
  Administered 2017-07-17: 40 mg via INTRAVENOUS
  Filled 2017-07-17: qty 4

## 2017-07-17 MED ORDER — POTASSIUM CHLORIDE CRYS ER 20 MEQ PO TBCR
40.0000 meq | EXTENDED_RELEASE_TABLET | Freq: Once | ORAL | Status: AC
  Administered 2017-07-17: 40 meq via ORAL
  Filled 2017-07-17: qty 2

## 2017-07-17 NOTE — ED Provider Notes (Signed)
[facility] Emergency Department Provider Note  ____________________________________________   First MD Initiated Contact with Patient 07/17/17 1802     (approximate)  I have reviewed the triage vital signs and the nursing notes.   HISTORY  Chief Complaint fluid retention   HPI [name] is a 32 y.o. male with history of anasarca as well as diabetes who is presenting to the emergency department for worsening diffuse anasarca.  He says over the past several days he has had persistent swelling throughout his body including his abdomen as well as left lower extremity.  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  He says that he is been taking his 40 mg daily of Lasix.  Says that he came to the hospital today because he feels that he is swollen so much that is been difficult to walk.  Says that he also had a cheeseburger and fries for lunch and has been compliant with his insulin.  Past Medical History:  Diagnosis Date  . Abscess of groin, right 09/09/2016  . 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 Acute gastroenteritis 09/11/2016  . AKI (acute kidney injury) (HCC) 02/19/2013  . Anemia 12/14/2015  . Dehydration 09/11/2016  . Diabetes mellitus without complication (HCC)   . Diabetic acidosis without coma (HCC) 06/01/2016  . Elevated bilirubin 09/11/2016  . Essential hypertension with goal blood pressure less than 130/80 02/07/2015  . Hypercholesterolemia 02/25/2013  . Hyponatremia 09/11/2016  . Hypophosphatemia 12/14/2015  . Hypovitaminosis D 12/16/2015  . Mass of right inguinal region   . Microcytic anemia 11/17/2014  . Non-ST elevation myocardial infarction (NSTEMI) (HCC) 02/19/2013  . Poorly controlled type 1 diabetes mellitus (HCC) 03/03/2012   Overview:  Diagnosed 02/2012.  Now insulin-dependent  . Proteinuria 12/14/2015    Patient Active Problem List   Diagnosis Date Noted  . Anasarca 04/14/2017  . Dehydration  09/11/2016  . Elevated bilirubin 09/11/2016  . Acute gastroenteritis 09/11/2016  . Abscess of groin, right 09/09/2016  . Diabetic acidosis without coma (HCC) 06/01/2016  . Hypovitaminosis D 12/16/2015  . Anemia 12/14/2015  . Hypophosphatemia 12/14/2015  . 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 Essential hypertension with goal blood pressure less than 130/80 02/07/2015  . Microcytic anemia 11/17/2014  . Hypercholesterolemia 02/25/2013  . AKI (acute kidney injury) (HCC) 02/19/2013  . Non-ST elevation myocardial infarction (NSTEMI) (HCC) 02/19/2013  . Poorly controlled type 1 diabetes mellitus (HCC) 03/03/2012    Past Surgical History:  Procedure Laterality Date  . LEG AMPUTATION ABOVE KNEE Right     Prior to Admission medications   Medication Sig Start Date End Date Taking? Authorizing Provider  aspirin EC 81 MG tablet Take 1 tablet (81 mg total) by mouth daily. 06/12/17 06/12/18  Adrian SaranMody, Sital, MD  atorvastatin (LIPITOR) 40 MG tablet Take 1 tablet (40 mg total) by mouth daily at 6 PM. 06/12/17   Adrian SaranMody, Sital, MD  FLUoxetine (PROZAC) 40 MG capsule Take 40 mg by mouth daily.    [provider]  furosemide (LASIX) 40 MG tablet Take 1 tablet (40 mg total) by mouth 2 (two) times daily. 06/12/17   Adrian SaranMody, Sital, MD  hydrALAZINE (APRESOLINE) 25 MG tablet Take 1 tablet (25 mg total) by mouth every 8 (eight) hours. 06/12/17   Adrian SaranMody, Sital, MD  insulin aspart (NOVOLOG) 100 UNIT/ML injection Inject 5 Units into the skin 3 (three) times daily before meals. Patient taking differently: Inject  10 Units into the skin 3 (three) times daily with meals.  04/18/17   Gouru, Deanna Artis, MD  insulin detemir (LEVEMIR) 100 UNIT/ML injection Inject 30 Units into the skin at bedtime.    [provider]  lisinopril (PRINIVIL,ZESTRIL) 20 MG tablet Take 1 tablet (20 mg total) by mouth daily. 06/12/17   Adrian Saran, MD  potassium chloride SA (K-DUR,KLOR-CON) 20 MEQ tablet Take 2  tablets (40 mEq total) by mouth 2 (two) times daily. 06/12/17   Adrian Saran, MD    Allergies Patient has no known allergies.  Family History  Problem Relation Age of Onset  . Diabetes Mother     Social History Social History   Tobacco Use  . Smoking status: Never Smoker  . Smokeless tobacco: Never Used  Substance Use Topics  . Alcohol use: No  . Drug use: No    Review of Systems  Constitutional: No fever/chills Eyes: No visual changes. ENT: No sore throat. Cardiovascular: Denies chest pain. Respiratory: Denies shortness of breath. Gastrointestinal: No abdominal pain.  No nausea, no vomiting.  No diarrhea.  No constipation. Genitourinary: Negative for dysuria. Musculoskeletal: Negative for back pain. Skin: Negative for rash. Neurological: Negative for headaches, focal weakness or numbness.   ____________________________________________   PHYSICAL EXAM:  VITAL SIGNS: ED Triage Vitals  Enc Vitals Group     BP 07/17/17 1548 (!) 193/112     Pulse Rate 07/17/17 1548 (!) 109     Resp 07/17/17 1548 18     Temp 07/17/17 1548 98.4 F (36.9 C)     Temp Source 07/17/17 1548 Oral     SpO2 07/17/17 1548 97 %     Weight 07/17/17 1544 223 lb (101.2 kg)     Height 07/17/17 1544 5\' 6"  (1.676 m)     Head Circumference --      Peak Flow --      Pain Score 07/17/17 1544 8     Pain Loc --      Pain Edu? --      Excl. in GC? --     Constitutional: Alert and oriented. Well appearing and in no acute distress. Eyes: Conjunctivae are normal.  Head: Atraumatic. Nose: No congestion/rhinnorhea. Mouth/Throat: Mucous membranes are moist.  Neck: No stridor.   Cardiovascular: Normal rate, regular rhythm. Grossly normal heart sounds.  Good peripheral circulation. Respiratory: Normal respiratory effort.  No retractions. Lungs CTAB. Gastrointestinal: Soft and nontender. No distention. No CVA tenderness. Musculoskeletal: Moderate edema to the left lower extremity.  AKA to the right  lower extremity with a stump not exposed.  Also with mild edema over the abdomen.  No induration, no tenderness to palpation. Neurologic:  Normal speech and language. No gross focal neurologic deficits are appreciated. Skin:  Skin is warm, dry and intact. No rash noted. Psychiatric: Mood and affect are normal. Speech and behavior are normal.  ____________________________________________   LABS (all labs ordered are listed, but only abnormal results are displayed)  Labs Reviewed  CBC WITH DIFFERENTIAL/PLATELET - Abnormal; Notable for the following components:      Result Value   RBC 3.88 (*)    Hemoglobin 10.3 (*)    HCT 31.2 (*)    RDW 14.6 (*)    All other components within normal limits  COMPREHENSIVE METABOLIC PANEL - Abnormal; Notable for the following components:   Sodium 130 (*)    Chloride 97 (*)    Glucose, Bld 600 (*)    BUN 28 (*)    Creatinine,  Ser 1.58 (*)    Calcium 7.9 (*)    Total Protein 5.6 (*)    Albumin 1.4 (*)    AST 52 (*)    Alkaline Phosphatase 292 (*)    GFR calc non Af Amer 56 (*)    All other components within normal limits   ____________________________________________  EKG   ____________________________________________  RADIOLOGY   ____________________________________________   PROCEDURES  Procedure(s) performed:   Procedures  Critical Care performed:   ____________________________________________   INITIAL IMPRESSION / ASSESSMENT AND PLAN / ED COURSE  Pertinent labs & imaging results that were available during my care of the patient were reviewed by me and considered in my medical decision making (see chart for details).  DDX: Anasarca, hypoalbuminemia, cellulitis, CHF, diabetes, DKA, hyperglycemia As part of my medical decision making, I reviewed the following data within the electronic MEDICAL RECORD NUMBER Notes from prior ED visits    ----------------------------------------- 8:36 PM on  07/17/2017 -----------------------------------------  Patient's glucose is now in the 300s.  Has urinated about 1300 cc of fluid.  I will double the patient's Lasix dose over the next 2 days to 80 mg daily.  He says that he has plenty of Lasix at home to do this.  He will be following up with his primary care doctor next week to recheck his kidney function.  I was concerned about increasing his Lasix because of his renal insufficiency and feel that he will require close follow-up.  He is understanding of this plan willing to comply.  Will be discharged at this time.  ____________________________________________   FINAL CLINICAL IMPRESSION(S) / ED DIAGNOSES  Anasarca.  Hyperglycemia.    NEW MEDICATIONS STARTED DURING THIS VISIT:  New Prescriptions   No medications on file     Note:  This document was prepared using Dragon voice recognition software and may include unintentional dictation errors.     Myrna Blazer, MD 07/17/17 2037

## 2017-07-17 NOTE — ED Triage Notes (Signed)
Pt to ED via POV. Pt states that he has been retaining fluid. Pt states that he noticed  Yesterday that he is retaining more fluid than normal. Pt states that he has had recurrent issues with fluid retention for several years. Pt states that the swelling is generalized. Pt in NAD at this time.

## 2017-07-22 ENCOUNTER — Encounter: Payer: Self-pay | Admitting: *Deleted

## 2017-07-22 ENCOUNTER — Other Ambulatory Visit: Payer: Self-pay

## 2017-07-22 ENCOUNTER — Emergency Department: Payer: Medicaid Other

## 2017-07-22 ENCOUNTER — Emergency Department
Admission: EM | Admit: 2017-07-22 | Discharge: 2017-07-23 | Disposition: A | Discharge status: Home / Self Care | Payer: Medicaid Other | Attending: Emergency Medicine | Admitting: Emergency Medicine

## 2017-07-22 DIAGNOSIS — I10 Essential (primary) hypertension: Secondary | ICD-10-CM | POA: Diagnosis not present

## 2017-07-22 DIAGNOSIS — Z794 Long term (current) use of insulin: Secondary | ICD-10-CM | POA: Diagnosis not present

## 2017-07-22 DIAGNOSIS — R600 Localized edema: Secondary | ICD-10-CM | POA: Diagnosis not present

## 2017-07-22 DIAGNOSIS — Z79899 Other long term (current) drug therapy: Secondary | ICD-10-CM | POA: Insufficient documentation

## 2017-07-22 DIAGNOSIS — E1165 Type 2 diabetes mellitus with hyperglycemia: Secondary | ICD-10-CM | POA: Insufficient documentation

## 2017-07-22 DIAGNOSIS — R739 Hyperglycemia, unspecified: Secondary | ICD-10-CM

## 2017-07-22 DIAGNOSIS — Z7982 Long term (current) use of aspirin: Secondary | ICD-10-CM | POA: Insufficient documentation

## 2017-07-22 DIAGNOSIS — R609 Edema, unspecified: Secondary | ICD-10-CM

## 2017-07-22 LAB — COMPREHENSIVE METABOLIC PANEL
ALT: 25 U/L (ref 17–63)
ANION GAP: 11 (ref 5–15)
AST: 36 U/L (ref 15–41)
Albumin: 1.3 g/dL — ABNORMAL LOW (ref 3.5–5.0)
Alkaline Phosphatase: 118 U/L (ref 38–126)
BUN: 29 mg/dL — ABNORMAL HIGH (ref 6–20)
CO2: 22 mmol/L (ref 22–32)
CREATININE: 1.69 mg/dL — AB (ref 0.61–1.24)
Calcium: 8 mg/dL — ABNORMAL LOW (ref 8.9–10.3)
Chloride: 96 mmol/L — ABNORMAL LOW (ref 101–111)
GFR, EST AFRICAN AMERICAN: 60 mL/min — AB (ref >60)
GFR, EST NON AFRICAN AMERICAN: 52 mL/min — AB (ref >60)
Glucose, Bld: 477 mg/dL — ABNORMAL HIGH (ref 65–99)
Potassium: 3.7 mmol/L (ref 3.5–5.1)
SODIUM: 129 mmol/L — AB (ref 135–145)
Total Bilirubin: 0.9 mg/dL (ref 0.3–1.2)
Total Protein: 5.2 g/dL — ABNORMAL LOW (ref 6.5–8.1)

## 2017-07-22 LAB — CBC WITH DIFFERENTIAL/PLATELET
BASOS PCT: 2 %
Basophils Absolute: 0.1 10*3/uL (ref 0–0.1)
EOS ABS: 0.1 10*3/uL (ref 0–0.7)
Eosinophils Relative: 2 %
HCT: 29.7 % — ABNORMAL LOW (ref 40.0–52.0)
HEMOGLOBIN: 9.8 g/dL — AB (ref 13.0–18.0)
Lymphocytes Relative: 27 %
Lymphs Abs: 1.4 10*3/uL (ref 1.0–3.6)
MCH: 26.4 pg (ref 26.0–34.0)
MCHC: 32.8 g/dL (ref 32.0–36.0)
MCV: 80.5 fL (ref 80.0–100.0)
Monocytes Absolute: 0.4 10*3/uL (ref 0.2–1.0)
Monocytes Relative: 7 %
NEUTROS ABS: 3.3 10*3/uL (ref 1.4–6.5)
NEUTROS PCT: 62 %
Platelets: 364 10*3/uL (ref 150–440)
RBC: 3.69 MIL/uL — AB (ref 4.40–5.90)
RDW: 14.9 % — ABNORMAL HIGH (ref 11.5–14.5)
WBC: 5.2 10*3/uL (ref 3.8–10.6)

## 2017-07-22 LAB — GLUCOSE, CAPILLARY
GLUCOSE-CAPILLARY: 365 mg/dL — AB (ref 65–99)
Glucose-Capillary: 399 mg/dL — ABNORMAL HIGH (ref 65–99)

## 2017-07-22 LAB — BRAIN NATRIURETIC PEPTIDE: B NATRIURETIC PEPTIDE 5: 149 pg/mL — AB (ref 0.0–100.0)

## 2017-07-22 MED ORDER — FUROSEMIDE 40 MG PO TABS
80.0000 mg | ORAL_TABLET | Freq: Once | ORAL | Status: AC
  Administered 2017-07-22: 80 mg via ORAL
  Filled 2017-07-22: qty 2

## 2017-07-22 MED ORDER — INSULIN ASPART 100 UNIT/ML ~~LOC~~ SOLN
5.0000 [IU] | Freq: Once | SUBCUTANEOUS | Status: DC
  Filled 2017-07-22: qty 1

## 2017-07-22 MED ORDER — INSULIN ASPART 100 UNIT/ML ~~LOC~~ SOLN
5.0000 [IU] | Freq: Once | SUBCUTANEOUS | Status: AC
  Administered 2017-07-22: 5 [IU] via SUBCUTANEOUS

## 2017-07-22 NOTE — ED Provider Notes (Signed)
[facility] Emergency Department Provider Note   ____________________________________________   First MD Initiated Contact with Patient 07/22/17 1830     (approximate)  I have reviewed the triage vital signs and the nursing notes.   HISTORY  Chief Complaint Facial Swelling    HPI [name] is a 32 y.o. male reports when he got up this morning he felt as though his face seems swollen.  Reports that both sides of his cheeks as well as his forehead and slightly around his lip seems swollen.  Reports it feels about the same, possibly slightly better now than it was this morning.  He also reports he is noticed swelling off and on in his legs and arms.  He was admitted to the hospital and had to have a lot of "fluid" taken out of him about 2 months ago.  He does continue to take his prescribed medications, reports he does not check his blood sugar today, in addition he continues to take his blood pressure medicines including Zestril.  Denies trouble breathing or swallowing.  No chest pain.  No shortness of breath.  Reports swelling in his left leg is a chronic problem, seems to be the same if not slightly worse for the last month.    Past Medical History:  Diagnosis Date  . Abscess of groin, right 09/09/2016  . 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 Acute gastroenteritis 09/11/2016  . AKI (acute kidney injury) (HCC) 02/19/2013  . Anemia 12/14/2015  . Dehydration 09/11/2016  . Diabetes mellitus without complication (HCC)   . Diabetic acidosis without coma (HCC) 06/01/2016  . Elevated bilirubin 09/11/2016  . Essential hypertension with goal blood pressure less than 130/80 02/07/2015  . Hypercholesterolemia 02/25/2013  . Hyponatremia 09/11/2016  . Hypophosphatemia 12/14/2015  . Hypovitaminosis D 12/16/2015  . Mass of right inguinal region   . Microcytic anemia 11/17/2014  . Non-ST elevation myocardial infarction (NSTEMI) (HCC) 02/19/2013    . Poorly controlled type 1 diabetes mellitus (HCC) 03/03/2012   Overview:  Diagnosed 02/2012.  Now insulin-dependent  . Proteinuria 12/14/2015    Patient Active Problem List   Diagnosis Date Noted  . Anasarca 04/14/2017  . Dehydration 09/11/2016  . Elevated bilirubin 09/11/2016  . Acute gastroenteritis 09/11/2016  . Abscess of groin, right 09/09/2016  . Diabetic acidosis without coma (HCC) 06/01/2016  . Hypovitaminosis D 12/16/2015  . Anemia 12/14/2015  . Hypophosphatemia 12/14/2015  . 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 Essential hypertension with goal blood pressure less than 130/80 02/07/2015  . Microcytic anemia 11/17/2014  . Hypercholesterolemia 02/25/2013  . AKI (acute kidney injury) (HCC) 02/19/2013  . Non-ST elevation myocardial infarction (NSTEMI) (HCC) 02/19/2013  . Poorly controlled type 1 diabetes mellitus (HCC) 03/03/2012    Past Surgical History:  Procedure Laterality Date  . LEG AMPUTATION ABOVE KNEE Right     Prior to Admission medications   Medication Sig Start Date End Date Taking? Authorizing Provider  aspirin EC 81 MG tablet Take 1 tablet (81 mg total) by mouth daily. 06/12/17 06/12/18  Adrian Saran, MD  atorvastatin (LIPITOR) 40 MG tablet Take 1 tablet (40 mg total) by mouth daily at 6 PM. 06/12/17   Adrian Saran, MD  FLUoxetine (PROZAC) 40 MG capsule Take 40 mg by mouth daily.    [provider]  furosemide (LASIX) 40 MG tablet Take 1 tablet (40 mg total) by mouth 2 (two) times daily. 06/12/17  Adrian Saran, MD  hydrALAZINE (APRESOLINE) 25 MG tablet Take 1 tablet (25 mg total) by mouth every 8 (eight) hours. 06/12/17   Adrian Saran, MD  insulin aspart (NOVOLOG) 100 UNIT/ML injection Inject 5 Units into the skin 3 (three) times daily before meals. Patient taking differently: Inject 10 Units into the skin 3 (three) times daily with meals.  04/18/17   Gouru, Deanna Artis, MD  insulin detemir (LEVEMIR) 100 UNIT/ML injection Inject  30 Units into the skin at bedtime.    [provider]  potassium chloride SA (K-DUR,KLOR-CON) 20 MEQ tablet Take 2 tablets (40 mEq total) by mouth 2 (two) times daily. 06/12/17   Adrian Saran, MD    Allergies Patient has no known allergies.  Family History  Problem Relation Age of Onset  . Diabetes Mother     Social History Social History   Tobacco Use  . Smoking status: Never Smoker  . Smokeless tobacco: Never Used  Substance Use Topics  . Alcohol use: No  . Drug use: No    Review of Systems Constitutional: No fever/chills and denies any changes in weakness or fatigue Eyes: No visual changes. ENT: No sore throat.  See HPI regarding facial swelling Cardiovascular: Denies chest pain. Respiratory: Denies shortness of breath. Gastrointestinal: No abdominal pain.  No nausea, no vomiting.  No diarrhea.  No constipation. Genitourinary: Negative for dysuria. Musculoskeletal: Negative for back pain. Skin: Negative for rash.  No itching or hives. Neurological: Negative for headaches, focal weakness or numbness. Denies being allergic to anything.  No itching or hives.   ____________________________________________   PHYSICAL EXAM:  VITAL SIGNS: ED Triage Vitals  Enc Vitals Group     BP 07/22/17 1637 134/74     Pulse Rate 07/22/17 1637 (!) 110     Resp 07/22/17 1637 16     Temp 07/22/17 1637 98.3 F (36.8 C)     Temp src --      SpO2 07/22/17 1637 98 %     Weight 07/22/17 1637 223 lb (101.2 kg)     Height 07/22/17 1637 5\' 6"  (1.676 m)     Head Circumference --      Peak Flow --      Pain Score 07/22/17 1636 8     Pain Loc --      Pain Edu? --      Excl. in GC? --     Constitutional: Alert and oriented. Well appearing and in no acute distress.  Somewhat obese, he does have a somewhat moonlike face. Eyes: Conjunctivae are normal. Head: Atraumatic. Nose: No congestion/rhinnorhea. Mouth/Throat: Mucous membranes are moist. Neck: No stridor.  The face does  have some slight edema, some very minimal asymmetric edema of the right lower lip compared to the left.  The oropharynx is widely patent.  Normal voice.  No trouble breathing.  No stridor. Cardiovascular: Normal rate, regular rhythm. Grossly normal heart sounds.  Good peripheral circulation. Respiratory: Normal respiratory effort.  No retractions. Lungs CTAB. Gastrointestinal: Soft and nontender. No distention. Musculoskeletal: No lower extremity tenderness does have 2+ pitting edema of the left lower extremity.  Right lower extremity is surgically absent Neurologic:  Normal speech and language. No gross focal neurologic deficits are appreciated.  Skin:  Skin is warm, dry and intact. No rash noted. Psychiatric: Mood and affect are normal. Speech and behavior are normal.  ____________________________________________   LABS (all labs ordered are listed, but only abnormal results are displayed)  Labs Reviewed  COMPREHENSIVE METABOLIC PANEL -  Abnormal; Notable for the following components:      Result Value   Sodium 129 (*)    Chloride 96 (*)    Glucose, Bld 477 (*)    BUN 29 (*)    Creatinine, Ser 1.69 (*)    Calcium 8.0 (*)    Total Protein 5.2 (*)    Albumin 1.3 (*)    GFR calc non Af Amer 52 (*)    GFR calc Af Amer 60 (*)    All other components within normal limits  CBC WITH DIFFERENTIAL/PLATELET - Abnormal; Notable for the following components:   RBC 3.69 (*)    Hemoglobin 9.8 (*)    HCT 29.7 (*)    RDW 14.9 (*)    All other components within normal limits  BRAIN NATRIURETIC PEPTIDE - Abnormal; Notable for the following components:   B Natriuretic Peptide 149.0 (*)    All other components within normal limits  GLUCOSE, CAPILLARY - Abnormal; Notable for the following components:   Glucose-Capillary 365 (*)    All other components within normal limits  CBG MONITORING, ED  CBG MONITORING, ED  CBG MONITORING, ED    ____________________________________________  EKG   ____________________________________________  RADIOLOGY  Dg Chest 2 View  Result Date: 07/22/2017 CLINICAL DATA:  Pt to ED reporting facial swelling since this morning. Pt was seen last week for reported leg swelling x 1 year. PT reports he went to sleep and woke up with face completely swollen. Significant amount of facial swelling noted at this time. No SOB reported. No new medications taken and no new foods. Pt denies knowledge of what caused swelling in face but reports :full body swelling" intermittently "fo a while". No hx of heart or lung issues. EXAM: CHEST  2 VIEW COMPARISON:  06/09/2017 FINDINGS: Cardiac silhouette is normal in size. Normal mediastinal and hilar contours. Clear lungs. Minimal pleural effusions blunt the posterior costophrenic sulci. No pneumothorax. Skeletal structures are unremarkable. IMPRESSION: 1. No acute cardiopulmonary disease. 2. Minimal pleural effusions. Electronically Signed   By: Amie Portland M.D.   On: 07/22/2017 19:30    Chest x-ray reviewed, minimal pleural effusions ____________________________________________   PROCEDURES  Procedure(s) performed: None  Procedures  Critical Care performed: No  ____________________________________________   INITIAL IMPRESSION / ASSESSMENT AND PLAN / ED COURSE  Pertinent labs & imaging results that were available during my care of the patient were reviewed by me and considered in my medical decision making (see chart for details).  Patient presents for facial edema.  On exam he does seem to have some slight facial edema and some minimal lip edema, of note he has a history of anasarca and is required significant diuresis due to his nephrotic syndrome in the past.  In addition, he is also on Zestril, raising the risk for angioedema related to ACE inhibitor use.  I have counseled him and advised him to stop taking his ACE inhibitor as I am not certain at  this time if this could be responsible for swelling, but I also plan to chest a chest x-ray and BNP as he also appears slightly volume overloaded and question if he may be experiencing some facial edema is reports sleeping and noticing when he woke up, seems to be getting better throughout the day.  Possibly some dependent edema involving the face as well given his nephrotic syndrome.  Nevertheless, he appears stable, no acute distress with no evidence of acute airway involvement.  We will monitor him closely, check labs  including his BNP and if he continues to improve likely discharge to home with close follow-up and stopping his ACE inhibitor as previously counseled (Zestril).       ----------------------------------------- 12:11 AM on 07/23/2017 -----------------------------------------  Patient reevaluated, we have given insulin now for his elevated glucose which is slowly improving.  Plan to recheck his glucose shortly, if downtrending we will discharge the patient to home.  He does not show any worsening of the edema that he reports are on his face and has on examination no clear angioedema, I suspect this is likely some edema secondary to his known nephrotic syndrome.  Give him a double dose of Lasix here and have advised him to follow-up very closely with his primary care team for which she reports he is able.  Discussed careful return precautions including discontinuation of his lisinopril.  Patient is agreeable with this plan.  Patient is speaking in full clear sentences without any distress.  No evidence of angioedema.  Do not believe this represents an allergic reaction.  He demonstrated no hives itching hypotension, or other allergic symptoms.  Ongoing care assigned to Dr. Manson PasseyBrown, anticipate discharge to home once blood sugar improved. ____________________________________________   FINAL CLINICAL IMPRESSION(S) / ED DIAGNOSES  Final diagnoses:  Peripheral edema  Hyperglycemia       NEW MEDICATIONS STARTED DURING THIS VISIT:  Current Discharge Medication List       Note:  This document was prepared using Dragon voice recognition software and may include unintentional dictation errors.     Sharyn CreamerQuale, Anamae Rochelle, MD 07/23/17 [phone]

## 2017-07-22 NOTE — ED Notes (Signed)
Pt has noticeable swelling to face and complains of swelling to entire body also. Pt states when he lays down at night he notices that one side seems to be bigger than the other.

## 2017-07-22 NOTE — ED Triage Notes (Signed)
Pt to ED reporting facial swelling since this morning. Pt was seen last week for reported leg swelling x 1 year. PT reports he went to sleep and woke up with face completely swollen. Significant amount of facial swelling noted at this time. No SOB reported. No new medications taken and no new foods. Pt denies knowledge of what caused swelling in face but reports :full body swelling" intermittently "fo a while"

## 2017-07-22 NOTE — ED Notes (Signed)
BS 365

## 2017-07-23 LAB — GLUCOSE, CAPILLARY
Glucose-Capillary: 341 mg/dL — ABNORMAL HIGH (ref 65–99)
Glucose-Capillary: 366 mg/dL — ABNORMAL HIGH (ref 65–99)

## 2017-07-23 MED ORDER — INSULIN ASPART 100 UNIT/ML ~~LOC~~ SOLN
SUBCUTANEOUS | Status: AC
  Filled 2017-07-23: qty 1

## 2017-07-23 MED ORDER — INSULIN ASPART 100 UNIT/ML ~~LOC~~ SOLN: 5.0000 [IU] | Freq: Once | SUBCUTANEOUS | Status: DC

## 2017-07-23 NOTE — Discharge Instructions (Signed)
STOP taking lisinopril (Zestril), as this medication can cause swelling and it is unclear if this could be responsible for the swelling episode she been experiencing.  Additionally, I suspect you have some extra fluid being retained as you have a strong history of this in the past.  Please follow-up closely with your primary doctor this week.  Return to the emergency room if you have increased swelling, trouble breathing, weakness, or symptoms are worsening, you have swelling of the lips, feel you cannot breathe or swallow, or other new concerns arise.

## 2017-07-29 ENCOUNTER — Other Ambulatory Visit: Payer: Self-pay

## 2017-07-29 ENCOUNTER — Inpatient Hospital Stay
Admission: EM | Admit: 2017-07-29 | Discharge: 2017-08-14 | DRG: 698 | Disposition: A | Discharge status: Home / Self Care | Payer: Medicare Other | Attending: Internal Medicine | Admitting: Internal Medicine

## 2017-07-29 ENCOUNTER — Encounter: Payer: Self-pay | Admitting: Emergency Medicine

## 2017-07-29 ENCOUNTER — Emergency Department: Payer: Medicare Other

## 2017-07-29 DIAGNOSIS — G479 Sleep disorder, unspecified: Secondary | ICD-10-CM | POA: Diagnosis present

## 2017-07-29 DIAGNOSIS — E785 Hyperlipidemia, unspecified: Secondary | ICD-10-CM | POA: Diagnosis present

## 2017-07-29 DIAGNOSIS — J9801 Acute bronchospasm: Secondary | ICD-10-CM | POA: Diagnosis not present

## 2017-07-29 DIAGNOSIS — Z7982 Long term (current) use of aspirin: Secondary | ICD-10-CM

## 2017-07-29 DIAGNOSIS — N179 Acute kidney failure, unspecified: Secondary | ICD-10-CM | POA: Diagnosis present

## 2017-07-29 DIAGNOSIS — I878 Other specified disorders of veins: Secondary | ICD-10-CM | POA: Diagnosis present

## 2017-07-29 DIAGNOSIS — E7800 Pure hypercholesterolemia, unspecified: Secondary | ICD-10-CM | POA: Diagnosis present

## 2017-07-29 DIAGNOSIS — F4323 Adjustment disorder with mixed anxiety and depressed mood: Secondary | ICD-10-CM

## 2017-07-29 DIAGNOSIS — I5033 Acute on chronic diastolic (congestive) heart failure: Secondary | ICD-10-CM | POA: Diagnosis not present

## 2017-07-29 DIAGNOSIS — R601 Generalized edema: Secondary | ICD-10-CM

## 2017-07-29 DIAGNOSIS — I130 Hypertensive heart and chronic kidney disease with heart failure and stage 1 through stage 4 chronic kidney disease, or unspecified chronic kidney disease: Secondary | ICD-10-CM | POA: Diagnosis present

## 2017-07-29 DIAGNOSIS — R51 Headache: Secondary | ICD-10-CM | POA: Diagnosis not present

## 2017-07-29 DIAGNOSIS — T783XXA Angioneurotic edema, initial encounter: Secondary | ICD-10-CM | POA: Diagnosis not present

## 2017-07-29 DIAGNOSIS — Z89611 Acquired absence of right leg above knee: Secondary | ICD-10-CM | POA: Diagnosis not present

## 2017-07-29 DIAGNOSIS — I252 Old myocardial infarction: Secondary | ICD-10-CM | POA: Diagnosis not present

## 2017-07-29 DIAGNOSIS — M7989 Other specified soft tissue disorders: Secondary | ICD-10-CM

## 2017-07-29 DIAGNOSIS — Y92230 Patient room in hospital as the place of occurrence of the external cause: Secondary | ICD-10-CM | POA: Diagnosis not present

## 2017-07-29 DIAGNOSIS — D631 Anemia in chronic kidney disease: Secondary | ICD-10-CM | POA: Diagnosis present

## 2017-07-29 DIAGNOSIS — N182 Chronic kidney disease, stage 2 (mild): Secondary | ICD-10-CM | POA: Diagnosis present

## 2017-07-29 DIAGNOSIS — T464X5A Adverse effect of angiotensin-converting-enzyme inhibitors, initial encounter: Secondary | ICD-10-CM | POA: Diagnosis not present

## 2017-07-29 DIAGNOSIS — E871 Hypo-osmolality and hyponatremia: Secondary | ICD-10-CM | POA: Diagnosis not present

## 2017-07-29 DIAGNOSIS — Z9713 Presence of artificial right leg (complete) (partial): Secondary | ICD-10-CM

## 2017-07-29 DIAGNOSIS — Z888 Allergy status to other drugs, medicaments and biological substances status: Secondary | ICD-10-CM

## 2017-07-29 DIAGNOSIS — Z5329 Procedure and treatment not carried out because of patient's decision for other reasons: Secondary | ICD-10-CM | POA: Diagnosis not present

## 2017-07-29 DIAGNOSIS — J181 Lobar pneumonia, unspecified organism: Secondary | ICD-10-CM | POA: Diagnosis not present

## 2017-07-29 DIAGNOSIS — E1165 Type 2 diabetes mellitus with hyperglycemia: Secondary | ICD-10-CM | POA: Diagnosis present

## 2017-07-29 DIAGNOSIS — D509 Iron deficiency anemia, unspecified: Secondary | ICD-10-CM | POA: Diagnosis present

## 2017-07-29 DIAGNOSIS — E876 Hypokalemia: Secondary | ICD-10-CM | POA: Diagnosis present

## 2017-07-29 DIAGNOSIS — Z9119 Patient's noncompliance with other medical treatment and regimen: Secondary | ICD-10-CM | POA: Diagnosis not present

## 2017-07-29 DIAGNOSIS — J9601 Acute respiratory failure with hypoxia: Secondary | ICD-10-CM | POA: Diagnosis present

## 2017-07-29 DIAGNOSIS — Y95 Nosocomial condition: Secondary | ICD-10-CM | POA: Diagnosis present

## 2017-07-29 DIAGNOSIS — J811 Chronic pulmonary edema: Secondary | ICD-10-CM

## 2017-07-29 DIAGNOSIS — Z794 Long term (current) use of insulin: Secondary | ICD-10-CM | POA: Diagnosis not present

## 2017-07-29 DIAGNOSIS — I2510 Atherosclerotic heart disease of native coronary artery without angina pectoris: Secondary | ICD-10-CM | POA: Diagnosis present

## 2017-07-29 DIAGNOSIS — E1121 Type 2 diabetes mellitus with diabetic nephropathy: Principal | ICD-10-CM | POA: Diagnosis present

## 2017-07-29 DIAGNOSIS — A419 Sepsis, unspecified organism: Secondary | ICD-10-CM | POA: Diagnosis not present

## 2017-07-29 DIAGNOSIS — Z833 Family history of diabetes mellitus: Secondary | ICD-10-CM

## 2017-07-29 DIAGNOSIS — E1122 Type 2 diabetes mellitus with diabetic chronic kidney disease: Secondary | ICD-10-CM | POA: Diagnosis present

## 2017-07-29 DIAGNOSIS — T508X5A Adverse effect of diagnostic agents, initial encounter: Secondary | ICD-10-CM | POA: Diagnosis not present

## 2017-07-29 DIAGNOSIS — Z79899 Other long term (current) drug therapy: Secondary | ICD-10-CM

## 2017-07-29 DIAGNOSIS — R05 Cough: Secondary | ICD-10-CM

## 2017-07-29 DIAGNOSIS — R0602 Shortness of breath: Secondary | ICD-10-CM

## 2017-07-29 DIAGNOSIS — R059 Cough, unspecified: Secondary | ICD-10-CM

## 2017-07-29 DIAGNOSIS — R739 Hyperglycemia, unspecified: Secondary | ICD-10-CM

## 2017-07-29 LAB — HEPATIC FUNCTION PANEL
ALT: 21 U/L (ref 17–63)
AST: 47 U/L — ABNORMAL HIGH (ref 15–41)
Albumin: 1.3 g/dL — ABNORMAL LOW (ref 3.5–5.0)
Alkaline Phosphatase: 138 U/L — ABNORMAL HIGH (ref 38–126)
BILIRUBIN INDIRECT: 0.8 mg/dL (ref 0.3–0.9)
Bilirubin, Direct: 0.3 mg/dL (ref 0.1–0.5)
TOTAL PROTEIN: 5.1 g/dL — AB (ref 6.5–8.1)
Total Bilirubin: 1.1 mg/dL (ref 0.3–1.2)

## 2017-07-29 LAB — BASIC METABOLIC PANEL
Anion gap: 9 (ref 5–15)
BUN: 27 mg/dL — AB (ref 6–20)
CO2: 20 mmol/L — ABNORMAL LOW (ref 22–32)
CREATININE: 1.66 mg/dL — AB (ref 0.61–1.24)
Calcium: 7.8 mg/dL — ABNORMAL LOW (ref 8.9–10.3)
Chloride: 99 mmol/L — ABNORMAL LOW (ref 101–111)
GFR, EST NON AFRICAN AMERICAN: 53 mL/min — AB (ref >60)
Glucose, Bld: 507 mg/dL (ref 65–99)
Potassium: 4.1 mmol/L (ref 3.5–5.1)
SODIUM: 128 mmol/L — AB (ref 135–145)

## 2017-07-29 LAB — GLUCOSE, CAPILLARY
GLUCOSE-CAPILLARY: 357 mg/dL — AB (ref 65–99)
GLUCOSE-CAPILLARY: 362 mg/dL — AB (ref 65–99)
GLUCOSE-CAPILLARY: 406 mg/dL — AB (ref 65–99)
GLUCOSE-CAPILLARY: 430 mg/dL — AB (ref 65–99)
GLUCOSE-CAPILLARY: 305 mg/dL — AB (ref 65–99)

## 2017-07-29 LAB — CBC
HCT: 27.9 % — ABNORMAL LOW (ref 40.0–52.0)
Hemoglobin: 9 g/dL — ABNORMAL LOW (ref 13.0–18.0)
MCH: 26.2 pg (ref 26.0–34.0)
MCHC: 32.2 g/dL (ref 32.0–36.0)
MCV: 81.2 fL (ref 80.0–100.0)
PLATELETS: 374 10*3/uL (ref 150–440)
RBC: 3.43 MIL/uL — AB (ref 4.40–5.90)
RDW: 14.7 % — AB (ref 11.5–14.5)
WBC: 5.9 10*3/uL (ref 3.8–10.6)

## 2017-07-29 LAB — BRAIN NATRIURETIC PEPTIDE: B NATRIURETIC PEPTIDE 5: 93 pg/mL (ref 0.0–100.0)

## 2017-07-29 LAB — TROPONIN I: TROPONIN I: 0.05 ng/mL — AB (ref <0.03)

## 2017-07-29 MED ORDER — FLUOXETINE HCL 20 MG PO CAPS
40.0000 mg | ORAL_CAPSULE | Freq: Every day | ORAL | Status: DC
  Administered 2017-07-29 – 2017-08-14 (×16): 40 mg via ORAL
  Filled 2017-07-29 (×17): qty 2

## 2017-07-29 MED ORDER — FUROSEMIDE 10 MG/ML IJ SOLN
4.0000 mg/h | INTRAVENOUS | Status: DC
  Administered 2017-07-29 – 2017-08-01 (×3): 4 mg/h via INTRAVENOUS
  Filled 2017-07-29 (×2): qty 25
  Filled 2017-07-29: qty 20
  Filled 2017-07-29: qty 25

## 2017-07-29 MED ORDER — ATORVASTATIN CALCIUM 20 MG PO TABS
40.0000 mg | ORAL_TABLET | Freq: Every day | ORAL | Status: DC
  Administered 2017-07-29 – 2017-08-13 (×16): 40 mg via ORAL
  Filled 2017-07-29 (×17): qty 2

## 2017-07-29 MED ORDER — INSULIN ASPART 100 UNIT/ML ~~LOC~~ SOLN
6.0000 [IU] | Freq: Once | SUBCUTANEOUS | Status: AC
  Administered 2017-07-29: 15:00:00 6 [IU] via SUBCUTANEOUS
  Filled 2017-07-29: qty 1

## 2017-07-29 MED ORDER — INSULIN ASPART 100 UNIT/ML ~~LOC~~ SOLN
0.0000 [IU] | Freq: Three times a day (TID) | SUBCUTANEOUS | Status: DC
  Administered 2017-07-29: 18:00:00 9 [IU] via SUBCUTANEOUS
  Administered 2017-07-30: 2 [IU] via SUBCUTANEOUS
  Administered 2017-07-30: 18:00:00 5 [IU] via SUBCUTANEOUS
  Administered 2017-07-31 (×2): 2 [IU] via SUBCUTANEOUS
  Administered 2017-08-01: 18:00:00 3 [IU] via SUBCUTANEOUS
  Administered 2017-08-01: 13:00:00 2 [IU] via SUBCUTANEOUS
  Administered 2017-08-02: 1 [IU] via SUBCUTANEOUS
  Administered 2017-08-03 – 2017-08-05 (×4): 2 [IU] via SUBCUTANEOUS
  Administered 2017-08-05: 17:00:00 7 [IU] via SUBCUTANEOUS
  Administered 2017-08-06: 2 [IU] via SUBCUTANEOUS
  Administered 2017-08-06: 13:00:00 1 [IU] via SUBCUTANEOUS
  Administered 2017-08-06 – 2017-08-07 (×2): 2 [IU] via SUBCUTANEOUS
  Filled 2017-07-29 (×17): qty 1

## 2017-07-29 MED ORDER — HEPARIN SODIUM (PORCINE) 5000 UNIT/ML IJ SOLN
5000.0000 [IU] | Freq: Three times a day (TID) | INTRAMUSCULAR | Status: DC
  Administered 2017-07-29 – 2017-08-02 (×14): 5000 [IU] via SUBCUTANEOUS
  Filled 2017-07-29 (×14): qty 1

## 2017-07-29 MED ORDER — ASPIRIN EC 81 MG PO TBEC
81.0000 mg | DELAYED_RELEASE_TABLET | Freq: Every day | ORAL | Status: DC
  Administered 2017-07-29 – 2017-08-14 (×17): 81 mg via ORAL
  Filled 2017-07-29 (×17): qty 1

## 2017-07-29 MED ORDER — INSULIN ASPART 100 UNIT/ML ~~LOC~~ SOLN
10.0000 [IU] | Freq: Once | SUBCUTANEOUS | Status: DC
  Filled 2017-07-29: qty 1

## 2017-07-29 MED ORDER — ALBUMIN HUMAN 5 % IV SOLN
25.0000 g | Freq: Once | INTRAVENOUS | Status: AC
  Administered 2017-07-29: 18:00:00 25 g via INTRAVENOUS
  Filled 2017-07-29 (×2): qty 500

## 2017-07-29 MED ORDER — ACETAMINOPHEN 325 MG PO TABS
650.0000 mg | ORAL_TABLET | Freq: Four times a day (QID) | ORAL | Status: DC | PRN
  Administered 2017-07-29 – 2017-08-05 (×12): 650 mg via ORAL
  Filled 2017-07-29 (×13): qty 2

## 2017-07-29 MED ORDER — INSULIN DETEMIR 100 UNIT/ML ~~LOC~~ SOLN
30.0000 [IU] | Freq: Every day | SUBCUTANEOUS | Status: DC
Start: 2017-07-29 — End: 2017-08-04
  Administered 2017-07-29 – 2017-08-03 (×6): 30 [IU] via SUBCUTANEOUS
  Filled 2017-07-29 (×7): qty 0.3

## 2017-07-29 MED ORDER — DOCUSATE SODIUM 100 MG PO CAPS
100.0000 mg | ORAL_CAPSULE | Freq: Two times a day (BID) | ORAL | Status: DC | PRN
Start: 2017-07-29 — End: 2017-08-14

## 2017-07-29 MED ORDER — HYDRALAZINE HCL 50 MG PO TABS
25.0000 mg | ORAL_TABLET | Freq: Three times a day (TID) | ORAL | Status: DC
  Administered 2017-07-30 (×2): 25 mg via ORAL
  Filled 2017-07-29 (×2): qty 1

## 2017-07-29 NOTE — ED Notes (Signed)
First Nurse Note:  Patient complaining of cough, chest pain, and back pain.  Voice sounds congested.  Alert and oriented.

## 2017-07-29 NOTE — ED Provider Notes (Signed)
[facility] Emergency Department Provider Note   ____________________________________________    I have reviewed the triage vital signs and the nursing notes.   HISTORY  Chief Complaint Chest Pain     HPI [name] is a 32 y.o. male with history as noted below who presents with diffuse swelling and chest pain.  Patient reports over the last several days he has become swollen "all over "this includes his legs and abdomen and arms and to a small degree in his face.  Review of records demonstrates that he has required Lasix drip in the past.  He reports compliance with his p.o. Lasix at home.  Additionally he also complains of tightness in his chest which started last night in which has been constant.  History of diabetes, poorly controlled.   Past Medical History:  Diagnosis Date  . Abscess of groin, right 09/09/2016  . 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 Acute gastroenteritis 09/11/2016  . AKI (acute kidney injury) (HCC) 02/19/2013  . Anemia 12/14/2015  . Dehydration 09/11/2016  . Diabetes mellitus without complication (HCC)   . Diabetic acidosis without coma (HCC) 06/01/2016  . Elevated bilirubin 09/11/2016  . Essential hypertension with goal blood pressure less than 130/80 02/07/2015  . Hypercholesterolemia 02/25/2013  . Hyponatremia 09/11/2016  . Hypophosphatemia 12/14/2015  . Hypovitaminosis D 12/16/2015  . Mass of right inguinal region   . Microcytic anemia 11/17/2014  . Non-ST elevation myocardial infarction (NSTEMI) (HCC) 02/19/2013  . Poorly controlled type 1 diabetes mellitus (HCC) 03/03/2012   Overview:  Diagnosed 02/2012.  Now insulin-dependent  . Proteinuria 12/14/2015    Patient Active Problem List   Diagnosis Date Noted  . Anasarca 04/14/2017  . Dehydration 09/11/2016  . Elevated bilirubin 09/11/2016  . Acute gastroenteritis 09/11/2016  . Abscess of groin, right 09/09/2016  . Diabetic acidosis without coma  (HCC) 06/01/2016  . Hypovitaminosis D 12/16/2015  . Anemia 12/14/2015  . Hypophosphatemia 12/14/2015  . 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 Essential hypertension with goal blood pressure less than 130/80 02/07/2015  . Microcytic anemia 11/17/2014  . Hypercholesterolemia 02/25/2013  . AKI (acute kidney injury) (HCC) 02/19/2013  . Non-ST elevation myocardial infarction (NSTEMI) (HCC) 02/19/2013  . Poorly controlled type 1 diabetes mellitus (HCC) 03/03/2012    Past Surgical History:  Procedure Laterality Date  . LEG AMPUTATION ABOVE KNEE Right     Prior to Admission medications   Medication Sig Start Date End Date Taking? Authorizing Provider  FLUoxetine (PROZAC) 40 MG capsule Take 40 mg by mouth daily.   Yes [provider]  furosemide (LASIX) 40 MG tablet Take 1 tablet (40 mg total) by mouth 2 (two) times daily. 06/12/17  Yes Mody, Patricia Pesa, MD  insulin detemir (LEVEMIR) 100 UNIT/ML injection Inject 30 Units into the skin at bedtime.   Yes [provider]  aspirin EC 81 MG tablet Take 1 tablet (81 mg total) by mouth daily. 06/12/17 06/12/18  Adrian Saran, MD  atorvastatin (LIPITOR) 40 MG tablet Take 1 tablet (40 mg total) by mouth daily at 6 PM. Patient not taking: Reported on 07/29/2017 06/12/17   Adrian Saran, MD  hydrALAZINE (APRESOLINE) 25 MG tablet Take 1 tablet (25 mg total) by mouth every 8 (eight) hours. Patient not taking: Reported on 07/29/2017 06/12/17   Adrian Saran, MD  insulin aspart (NOVOLOG) 100 UNIT/ML injection Inject 5 Units into the skin 3 (three) times daily before meals.  Patient taking differently: Inject 10 Units into the skin 3 (three) times daily with meals.  04/18/17   Gouru, Deanna ArtisAruna, MD  potassium chloride SA (K-DUR,KLOR-CON) 20 MEQ tablet Take 2 tablets (40 mEq total) by mouth 2 (two) times daily. Patient not taking: Reported on 07/29/2017 06/12/17   Adrian SaranMody, Sital, MD     Allergies Patient has no known  allergies.  Family History  Problem Relation Age of Onset  . Diabetes Mother     Social History Social History   Tobacco Use  . Smoking status: Never Smoker  . Smokeless tobacco: Never Used  Substance Use Topics  . Alcohol use: No  . Drug use: No    Review of Systems  Constitutional: No fever/chills Eyes: No visual changes.  ENT: No sore throat. Cardiovascular: as above Respiratory: No shortness of breath Gastrointestinal: Swelling, no pain Genitourinary: Negative for dysuria. Musculoskeletal: Negative for back pain. Skin: Negative for rash. Neurological: Negative for headaches    ____________________________________________   PHYSICAL EXAM:  VITAL SIGNS: ED Triage Vitals  Enc Vitals Group     BP 07/29/17 0926 (!) 171/89     Pulse Rate 07/29/17 0926 (!) 110     Resp 07/29/17 0926 18     Temp 07/29/17 0926 98.6 F (37 C)     Temp Source 07/29/17 0926 Oral     SpO2 07/29/17 0926 100 %     Weight --      Height --      Head Circumference --      Peak Flow --      Pain Score 07/29/17 0923 7     Pain Loc --      Pain Edu? --      Excl. in GC? --     Constitutional: Alert and oriented. No acute distress. Pleasant and interactive Eyes: Conjunctivae are normal.   Nose: No congestion/rhinnorhea. Mouth/Throat: Mucous membranes are moist.    Cardiovascular: Tachycardia, regular rhythm. Grossly normal heart sounds.  Good peripheral circulation. Respiratory: Normal respiratory effort.  No retractions. Lungs CTAB. Gastrointestinal: Soft and nontender.  Positive fluid noted/anasarca Genitourinary: deferred Musculoskeletal: Extremities are edematous bilaterally, upper, left lower, right aKA Neurologic:  Normal speech and language. No gross focal neurologic deficits are appreciated.  Skin:  Skin is warm, dry and intact. No rash noted. Psychiatric: Mood and affect are normal. Speech and behavior are normal.  ____________________________________________    LABS (all labs ordered are listed, but only abnormal results are displayed)  Labs Reviewed  BASIC METABOLIC PANEL - Abnormal; Notable for the following components:      Result Value   Sodium 128 (*)    Chloride 99 (*)    CO2 20 (*)    Glucose, Bld 507 (*)    BUN 27 (*)    Creatinine, Ser 1.66 (*)    Calcium 7.8 (*)    GFR calc non Af Amer 53 (*)    All other components within normal limits  CBC - Abnormal; Notable for the following components:   RBC 3.43 (*)    Hemoglobin 9.0 (*)    HCT 27.9 (*)    RDW 14.7 (*)    All other components within normal limits  TROPONIN I - Abnormal; Notable for the following components:   Troponin I 0.05 (*)    All other components within normal limits  BRAIN NATRIURETIC PEPTIDE   ____________________________________________  EKG  ED ECG REPORT I, Jene Everyobert [employer] Wartman, the attending physician, personally viewed and interpreted this ECG.  Date: 07/29/2017 EKG Time:  Rate: 107 Rhythm: Sinus tachycardia QRS Axis: normal Intervals: normal ST/T Wave abnormalities: normal Narrative Interpretation: no evidence of acute ischemia  ____________________________________________  RADIOLOGY  Chest x-ray shows improving pleural effusions ____________________________________________   PROCEDURES  Procedure(s) performed: No  Procedures   Critical Care performed: No ____________________________________________   INITIAL IMPRESSION / ASSESSMENT AND PLAN / ED COURSE  Pertinent labs & imaging results that were available during my care of the patient were reviewed by me and considered in my medical decision making (see chart for details).  Patient with extensive past medical history.  Appears to have anasarca today as well as chest tightness.  EKG is reassuring although mildly tachycardic.  Will check enzymes, labs and reevaluate.  Patient presents with essentially anasarca, seen in the emergency department twice in the past couple of weeks with  increase in Lasix however this has not helped his symptoms.  Has required Lasix drip in the past.  Will admit to the hospitalist for management of anasarca and hyperglycemia    ____________________________________________   FINAL CLINICAL IMPRESSION(S) / ED DIAGNOSES  Final diagnoses:  Anasarca  Hyperglycemia        Note:  This document was prepared using Dragon voice recognition software and may include unintentional dictation errors.    Jene Every, MD 07/29/17 1204

## 2017-07-29 NOTE — Progress Notes (Signed)
Patient requesting tylenol, verbal order obtained from MD. CBG is currently 362 with no orders for now. Verbal order obtained for 6 units novolog subq once. Orders placed. Bo McclintockBrewer,Jamila Slatten S, RN

## 2017-07-29 NOTE — H&P (Signed)
[facility]   PATIENT NAME: [name]    MR#:  [id number]  DATE OF BIRTH:  1986-05-17  DATE OF ADMISSION:  07/29/2017  PRIMARY CARE PHYSICIAN: Oswaldo Conroy, MD   REQUESTING/REFERRING PHYSICIAN: Kinner  CHIEF COMPLAINT:   Chief Complaint  Patient presents with  . Chest Pain    HISTORY OF PRESENT ILLNESS: [name]  is a 32 y.o. male with a known history of acute kidney injury, nephrotic range proteinuria, diabetes, hyponatremia, hypercholesterolemia, hypertension- was in hospital for anasarca and responded to IV Lasix drip and albumin injections in last month. Since then he had again repeated visits to emergency room for his worsening anasarca and he was given increasing dose of Lasix by ER physician, and patient claims to be taking it as directed but since it is not helping much as his edema keeps getting worse. On further questioning he agrees that he has been feeling short of breath on minimal exertion and has decreased functional ability due to that. He also aggravate his that he had been drinking a lot of liquids at home as nobody told him to cut down on that. ER physician suggested to admit as he may need IV Lasix drip again.  PAST MEDICAL HISTORY:   Past Medical History:  Diagnosis Date  . Abscess of groin, right 09/09/2016  . 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 Acute gastroenteritis 09/11/2016  . AKI (acute kidney injury) (HCC) 02/19/2013  . Anemia 12/14/2015  . Dehydration 09/11/2016  . Diabetes mellitus without complication (HCC)   . Diabetic acidosis without coma (HCC) 06/01/2016  . Elevated bilirubin 09/11/2016  . Essential hypertension with goal blood pressure less than 130/80 02/07/2015  . Hypercholesterolemia 02/25/2013  . Hyponatremia 09/11/2016  . Hypophosphatemia 12/14/2015  . Hypovitaminosis D 12/16/2015  . Mass of right inguinal region   . Microcytic anemia 11/17/2014  . Non-ST  elevation myocardial infarction (NSTEMI) (HCC) 02/19/2013  . Poorly controlled type 1 diabetes mellitus (HCC) 03/03/2012   Overview:  Diagnosed 02/2012.  Now insulin-dependent  . Proteinuria 12/14/2015    PAST SURGICAL HISTORY:  Past Surgical History:  Procedure Laterality Date  . LEG AMPUTATION ABOVE KNEE Right     SOCIAL HISTORY:  Social History   Tobacco Use  . Smoking status: Never Smoker  . Smokeless tobacco: Never Used  Substance Use Topics  . Alcohol use: No    FAMILY HISTORY:  Family History  Problem Relation Age of Onset  . Diabetes Mother     DRUG ALLERGIES: No Known Allergies  REVIEW OF SYSTEMS:   CONSTITUTIONAL: No fever, positive for fatigue or weakness.  EYES: No blurred or double vision.  EARS, NOSE, AND THROAT: No tinnitus or ear pain.  RESPIRATORY: No cough, shortness of breath, wheezing or hemoptysis.  CARDIOVASCULAR: No chest pain, orthopnea, have generalized edema.  GASTROINTESTINAL: No nausea, vomiting, diarrhea or abdominal pain.  GENITOURINARY: No dysuria, hematuria.  ENDOCRINE: No polyuria, nocturia,  HEMATOLOGY: No anemia, easy bruising or bleeding SKIN: No rash or lesion. MUSCULOSKELETAL: No joint pain or arthritis.   NEUROLOGIC: No tingling, numbness, weakness.  PSYCHIATRY: No anxiety or depression.   MEDICATIONS AT HOME:  Prior to Admission medications   Medication Sig Start Date End Date Taking? Authorizing Provider  FLUoxetine (PROZAC) 40 MG capsule Take 40 mg by mouth daily.   Yes [provider]  furosemide (LASIX) 40 MG tablet Take 1 tablet (40 mg total) by mouth 2 (  two) times daily. 06/12/17  Yes Mody, Patricia Pesa, MD  insulin detemir (LEVEMIR) 100 UNIT/ML injection Inject 30 Units into the skin at bedtime.   Yes [provider]  aspirin EC 81 MG tablet Take 1 tablet (81 mg total) by mouth daily. 06/12/17 06/12/18  Adrian Saran, MD  atorvastatin (LIPITOR) 40 MG tablet Take 1 tablet (40 mg total) by mouth daily at 6 PM. Patient  not taking: Reported on 07/29/2017 06/12/17   Adrian Saran, MD  hydrALAZINE (APRESOLINE) 25 MG tablet Take 1 tablet (25 mg total) by mouth every 8 (eight) hours. Patient not taking: Reported on 07/29/2017 06/12/17   Adrian Saran, MD  insulin aspart (NOVOLOG) 100 UNIT/ML injection Inject 5 Units into the skin 3 (three) times daily before meals. Patient taking differently: Inject 10 Units into the skin 3 (three) times daily with meals.  04/18/17   Gouru, Deanna Artis, MD  potassium chloride SA (K-DUR,KLOR-CON) 20 MEQ tablet Take 2 tablets (40 mEq total) by mouth 2 (two) times daily. Patient not taking: Reported on 07/29/2017 06/12/17   Adrian Saran, MD      PHYSICAL EXAMINATION:   VITAL SIGNS: Blood pressure (!) 167/106, pulse 100, temperature 98.6 F (37 C), temperature source Oral, resp. rate 16, SpO2 100 %.  GENERAL:  32 y.o.-year-old patient lying in the bed with no acute distress.  EYES: Pupils equal, round, reactive to light and accommodation. No scleral icterus. Extraocular muscles intact.  HEENT: Head atraumatic, normocephalic. Oropharynx and nasopharynx clear.  NECK:  Supple, no jugular venous distention. No thyroid enlargement, no tenderness.  LUNGS: Normal breath sounds bilaterally, no wheezing, rales,rhonchi or crepitation. No use of accessory muscles of respiration.  CARDIOVASCULAR: S1, S2 normal. No murmurs, rubs, or gallops.  ABDOMEN: Soft, nontender, nondistended. Bowel sounds present. No organomegaly or mass.  EXTREMITIES: Right above-knee amputation, left side significant edema, no cyanosis, or clubbing. edema extending to his abdominal wall and upper limbs. NEUROLOGIC: Cranial nerves II through XII are intact. Muscle strength 4/5 in all extremities. Sensation intact. Gait not checked.  PSYCHIATRIC: The patient is alert and oriented x 3.  SKIN: No obvious rash, lesion, or ulcer.   LABORATORY PANEL:   CBC Recent Labs  Lab 07/22/17 1640 07/29/17 0922  WBC 5.2 5.9  HGB 9.8* 9.0*  HCT  29.7* 27.9*  PLT 364 374  MCV 80.5 81.2  MCH 26.4 26.2  MCHC 32.8 32.2  RDW 14.9* 14.7*  LYMPHSABS 1.4  --   MONOABS 0.4  --   EOSABS 0.1  --   BASOSABS 0.1  --    ------------------------------------------------------------------------------------------------------------------  Chemistries  Recent Labs  Lab 07/22/17 1640 07/29/17 0922  NA 129* 128*  K 3.7 4.1  CL 96* 99*  CO2 22 20*  GLUCOSE 477* 507*  BUN 29* 27*  CREATININE 1.69* 1.66*  CALCIUM 8.0* 7.8*  AST 36  --   ALT 25  --   ALKPHOS 118  --   BILITOT 0.9  --    ------------------------------------------------------------------------------------------------------------------ estimated creatinine clearance is 70.5 mL/min (A) (by C-G formula based on SCr of 1.66 mg/dL (H)). ------------------------------------------------------------------------------------------------------------------ No results for input(s): TSH, T4TOTAL, T3FREE, THYROIDAB in the last 72 hours.  Invalid input(s): FREET3   Coagulation profile No results for input(s): INR, PROTIME in the last 168 hours. ------------------------------------------------------------------------------------------------------------------- No results for input(s): DDIMER in the last 72 hours. -------------------------------------------------------------------------------------------------------------------  Cardiac Enzymes Recent Labs  Lab 07/29/17 0922  TROPONINI 0.05*   ------------------------------------------------------------------------------------------------------------------ Invalid input(s): POCBNP  ---------------------------------------------------------------------------------------------------------------  Urinalysis    Component  Value Date/Time   COLORURINE STRAW (A) 06/09/2017 1031   APPEARANCEUR CLEAR (A) 06/09/2017 1031   LABSPEC 1.016 06/09/2017 1031   PHURINE 6.0 06/09/2017 1031   GLUCOSEU >=500 (A) 06/09/2017 1031   HGBUR SMALL  (A) 06/09/2017 1031   BILIRUBINUR NEGATIVE 06/09/2017 1031   KETONESUR 20 (A) 06/09/2017 1031   PROTEINUR 100 (A) 06/09/2017 1031   NITRITE NEGATIVE 06/09/2017 1031   LEUKOCYTESUR NEGATIVE 06/09/2017 1031     RADIOLOGY: Dg Chest 2 View  Result Date: 07/29/2017 CLINICAL DATA:  Chest pain EXAM: CHEST  2 VIEW COMPARISON:  07/22/2017 FINDINGS: Heart is normal size. No confluent airspace opacities. Possible minimal residual pleural effusions on the lateral view, decreased since prior study. No acute bony abnormality. IMPRESSION: Trace pleural effusions noted on the lateral view, decreased since prior study. Electronically Signed   By: Charlett NoseKevin  Dover M.D.   On: 07/29/2017 09:54    EKG: Orders placed or performed during the hospital encounter of 07/29/17  . ED EKG within 10 minutes  . ED EKG within 10 minutes  . EKG 12-Lead  . EKG 12-Lead    IMPRESSION AND PLAN:  * Anasarca   Likely due to combination of nephrotic range proteinuria and hypoalbuminemia and excessive fluid intake   Will keep on fluid restriction and give IV Lasix plus IV albumin.   Nephrology consult.   Daily monitoring and intake and output measurements.  * Uncontrolled diabetes   Counseled about dietary restrictions   IV NovoLog 10 units for now, and keep on sliding scale coverage with his basal Lantus.  * CK D stage III   Continue monitoring.  * Hypoalbuminemia   As mentioned above, IV albumin.  * Hypertension   He will be on IV Lasix drip.  * Hypercholesterolemia   Continue atorvastatin.  All the records are reviewed and case discussed with ED provider. Management plans discussed with the patient, family and they are in agreement.  CODE STATUS: Full code Code Status History    Date Active Date Inactive Code Status Order ID Comments User Context   06/09/2017 15:22 06/12/2017 17:22 Full Code [id number]  Ramonita LabGouru, Aruna, MD Inpatient   04/14/2017 14:05 04/18/2017 20:05 Full Code [id number]  Shaune Pollackhen, Qing, MD  Inpatient   09/09/2016 19:55 09/11/2016 18:59 Full Code [id number]  Altamese DillingVachhani, Devondre Guzzetta, MD Inpatient   06/01/2016 19:26 06/02/2016 18:35 Full Code [id number]  Ramonita LabGouru, Aruna, MD Inpatient       TOTAL TIME TAKING CARE OF THIS PATIENT: 50 minutes.    Altamese DillingVaibhavkumar Lendora Keys M.D on 07/29/2017   Between 7am to 6pm - Pager - [phone]  After 6pm go to [email or web address] - password [email or web address]  [facility]  Office  [phone]  CC: Primary care physician; [name], [name], MD   Note: This dictation was prepared with Dragon dictation along with smaller phrase technology. Any transcriptional errors that result from this process are unintentional.

## 2017-07-29 NOTE — ED Notes (Signed)
Patient transported to X-ray 

## 2017-07-29 NOTE — Progress Notes (Addendum)
Inpatient Diabetes Program Recommendations  AACE/ADA: New Consensus Statement on Inpatient Glycemic Control (2015)  Target Ranges:  Prepandial:   less than 140 mg/dL      Peak postprandial:   less than 180 mg/dL (1-2 hours)      Critically ill patients:  140 - 180 mg/dL   Results for Hinton DyerORAIN, Kelden (MRN [id number]) as of 07/29/2017 13:53  Ref. Range 07/29/2017 09:22  Glucose Latest Ref Range: 65 - 99 mg/dL 147507 [facility](HH)    Results for Hinton DyerORAIN, Akshar (MRN [id number]) as of 07/29/2017 13:53  Ref. Range 07/29/2017 13:21  Glucose-Capillary Latest Ref Range: 65 - 99 mg/dL [id number] (H)     Admit: Anasarca likely due to combination of nephrotic range proteinuria and hypoalbuminemia and excessive fluid intake  History: DM  Home DM Meds: Levemir 30 units QHS       Novolog 10 units TID  Current Orders: Novolog Sensitive Correction Scale/ SSI (0-9 units) TID AC     Levemir 30 units QHS        MD- Patient well known to the Inpatient Diabetes Team.  Has been counseled on previous visits.  Counseled by this DM Coordinator back in October (04/15/17).  Note CBG 357 mg/dl at 1pm today.  Note Levemir to start tonight.    --Will follow patient during hospitalization--  Ambrose FinlandJeannine Johnston Alaysia Lightle RN, MSN, CDE Diabetes Coordinator Inpatient Glycemic Control Team Team Pager: [phone] (8a-5p)

## 2017-07-29 NOTE — ED Triage Notes (Signed)
Pt to ed with c/o chest pain and dizziness when awaking this am.  Pt reports sob associated with pain.  Pt states increased swelling in feet and legs and abd.

## 2017-07-29 NOTE — Progress Notes (Signed)
°   07/29/17 1450  Clinical Encounter Type  Visited With Patient  Visit Type Initial  Referral From Nurse  Consult/Referral To Chaplain  Spiritual Encounters  Spiritual Needs Literature   CH received OR to visit PT and share information about an AD. CH educated the PT about the AD and CH was advised that PT wanted to read AD paperwork. Ch will follow up when needed.

## 2017-07-30 LAB — BASIC METABOLIC PANEL
Anion gap: 6 (ref 5–15)
Anion gap: 8 (ref 5–15)
BUN: 24 mg/dL — ABNORMAL HIGH (ref 6–20)
BUN: 24 mg/dL — AB (ref 6–20)
CALCIUM: 8.1 mg/dL — AB (ref 8.9–10.3)
CHLORIDE: 101 mmol/L (ref 101–111)
CHLORIDE: 99 mmol/L — AB (ref 101–111)
CO2: 26 mmol/L (ref 22–32)
CO2: 26 mmol/L (ref 22–32)
CREATININE: 1.36 mg/dL — AB (ref 0.61–1.24)
Calcium: 7.8 mg/dL — ABNORMAL LOW (ref 8.9–10.3)
Creatinine, Ser: 1.36 mg/dL — ABNORMAL HIGH (ref 0.61–1.24)
GFR calc non Af Amer: >60 mL/min (ref >60)
Glucose, Bld: 162 mg/dL — ABNORMAL HIGH (ref 65–99)
Glucose, Bld: 335 mg/dL — ABNORMAL HIGH (ref 65–99)
POTASSIUM: 2.9 mmol/L — AB (ref 3.5–5.1)
Potassium: 4.1 mmol/L (ref 3.5–5.1)
SODIUM: 133 mmol/L — AB (ref 135–145)
Sodium: 133 mmol/L — ABNORMAL LOW (ref 135–145)

## 2017-07-30 LAB — CBC
HEMATOCRIT: 24.1 % — AB (ref 40.0–52.0)
Hemoglobin: 7.8 g/dL — ABNORMAL LOW (ref 13.0–18.0)
MCH: 25.9 pg — ABNORMAL LOW (ref 26.0–34.0)
MCHC: 32.3 g/dL (ref 32.0–36.0)
MCV: 80.4 fL (ref 80.0–100.0)
PLATELETS: 305 10*3/uL (ref 150–440)
RBC: 3 MIL/uL — AB (ref 4.40–5.90)
RDW: 14.8 % — AB (ref 11.5–14.5)
WBC: 5.5 10*3/uL (ref 3.8–10.6)

## 2017-07-30 LAB — VITAMIN B12: VITAMIN B 12: 337 pg/mL (ref 180–914)

## 2017-07-30 LAB — FERRITIN: Ferritin: 127 ng/mL (ref 24–336)

## 2017-07-30 LAB — GLUCOSE, CAPILLARY
GLUCOSE-CAPILLARY: 87 mg/dL (ref 65–99)
GLUCOSE-CAPILLARY: 294 mg/dL — AB (ref 65–99)
Glucose-Capillary: 119 mg/dL — ABNORMAL HIGH (ref 65–99)
Glucose-Capillary: 199 mg/dL — ABNORMAL HIGH (ref 65–99)
Glucose-Capillary: 288 mg/dL — ABNORMAL HIGH (ref 65–99)

## 2017-07-30 LAB — MAGNESIUM
MAGNESIUM: 1.9 mg/dL (ref 1.7–2.4)
Magnesium: 2 mg/dL (ref 1.7–2.4)

## 2017-07-30 LAB — POTASSIUM: POTASSIUM: 3.2 mmol/L — AB (ref 3.5–5.1)

## 2017-07-30 MED ORDER — ALBUMIN HUMAN 25 % IV SOLN
25.0000 g | Freq: Three times a day (TID) | INTRAVENOUS | Status: DC
  Administered 2017-07-30 – 2017-08-03 (×13): 25 g via INTRAVENOUS
  Filled 2017-07-30 (×16): qty 100

## 2017-07-30 MED ORDER — AMLODIPINE BESYLATE 5 MG PO TABS
5.0000 mg | ORAL_TABLET | Freq: Every day | ORAL | Status: DC
  Administered 2017-07-30 – 2017-08-08 (×10): 5 mg via ORAL
  Filled 2017-07-30 (×10): qty 1

## 2017-07-30 MED ORDER — POTASSIUM CHLORIDE CRYS ER 20 MEQ PO TBCR
20.0000 meq | EXTENDED_RELEASE_TABLET | Freq: Two times a day (BID) | ORAL | Status: DC
  Administered 2017-07-30 – 2017-08-03 (×9): 20 meq via ORAL
  Filled 2017-07-30 (×9): qty 1

## 2017-07-30 MED ORDER — POTASSIUM CHLORIDE CRYS ER 20 MEQ PO TBCR
40.0000 meq | EXTENDED_RELEASE_TABLET | Freq: Once | ORAL | Status: AC
  Administered 2017-07-30: 11:00:00 40 meq via ORAL
  Filled 2017-07-30: qty 2

## 2017-07-30 MED ORDER — HYDRALAZINE HCL 50 MG PO TABS
50.0000 mg | ORAL_TABLET | Freq: Three times a day (TID) | ORAL | Status: DC
  Administered 2017-07-30 – 2017-08-08 (×27): 50 mg via ORAL
  Filled 2017-07-30 (×28): qty 1

## 2017-07-30 MED ORDER — MAGNESIUM OXIDE 400 (241.3 MG) MG PO TABS
400.0000 mg | ORAL_TABLET | Freq: Every day | ORAL | Status: DC
  Administered 2017-07-30: 19:00:00 400 mg via ORAL
  Filled 2017-07-30: qty 1

## 2017-07-30 MED ORDER — INSULIN ASPART 100 UNIT/ML ~~LOC~~ SOLN: 0.0000 [IU] | Freq: Three times a day (TID) | SUBCUTANEOUS | Status: DC

## 2017-07-30 MED ORDER — POTASSIUM CHLORIDE CRYS ER 20 MEQ PO TBCR
40.0000 meq | EXTENDED_RELEASE_TABLET | Freq: Once | ORAL | Status: AC
  Administered 2017-07-30: 40 meq via ORAL
  Filled 2017-07-30: qty 2

## 2017-07-30 MED ORDER — POTASSIUM CHLORIDE CRYS ER 20 MEQ PO TBCR
40.0000 meq | EXTENDED_RELEASE_TABLET | Freq: Once | ORAL | Status: AC
  Administered 2017-07-30: 07:00:00 40 meq via ORAL
  Filled 2017-07-30: qty 2

## 2017-07-30 MED ORDER — INSULIN ASPART 100 UNIT/ML ~~LOC~~ SOLN
0.0000 [IU] | Freq: Every day | SUBCUTANEOUS | Status: DC
  Administered 2017-07-30: 22:00:00 3 [IU] via SUBCUTANEOUS
  Administered 2017-07-31: 21:00:00 4 [IU] via SUBCUTANEOUS
  Administered 2017-08-01 – 2017-08-04 (×4): 2 [IU] via SUBCUTANEOUS
  Administered 2017-08-05: 22:00:00 4 [IU] via SUBCUTANEOUS
  Filled 2017-07-30 (×7): qty 1

## 2017-07-30 NOTE — Progress Notes (Signed)
[facility]  ROUNDING NOTE   Subjective:  Patient well known to [place] from prior admissions.  Presents again with increasing lower extremity edema and shortness of breath.  Recently was felt to have angioedema and was taken off of lisinopril. Blood pressure noted to be high.  Has nephrotic syndrome and very low albumin.    Objective:  Vital signs in last 24 hours:  Temp:  [97.9 F (36.6 C)-98.5 F (36.9 C)] 98.4 F (36.9 C) (01/23 1300) Pulse Rate:  [95-101] 97 (01/23 1300) Resp:  [14-20] 20 (01/23 1300) BP: (159-186)/(95-104) 161/96 (01/23 1300) SpO2:  [99 %-100 %] 100 % (01/23 1300) Weight:  [113 kg (249 lb 1.6 oz)-118.5 kg (261 lb 4.8 oz)] 113 kg (249 lb 1.6 oz) (01/23 0401)  Weight change:  Filed Weights   07/29/17 1353 07/30/17 0401  Weight: 118.5 kg (261 lb 4.8 oz) 113 kg (249 lb 1.6 oz)    Intake/Output: I/O last 3 completed shifts: In: 254.6 [P.O.:240; I.V.:14.6] Out: 2950 [Urine:2950]   Intake/Output this shift:  No intake/output data recorded.  Physical Exam: General: No acute distress  Head: Normocephalic, atraumatic. Moist oral mucosal membranes  Eyes: Anicteric  Neck: Supple, trachea midline  Lungs:  Clear to auscultation, normal effort  Heart: S1S2 no rubs  Abdomen:  Soft, nontender, bowel sounds present  Extremities: LLE edema 2+ R AKA  Neurologic: Awake, alert, following commands  Skin: Venous stasis changes bilateral lower extremeties       Basic Metabolic Panel: Recent Labs  Lab 07/29/17 0922 07/30/17 0402 07/30/17 1135  NA 128* 133*  --   K 4.1 2.9* 3.2*  CL 99* 101  --   CO2 20* 26  --   GLUCOSE 507* 162*  --   BUN 27* 24*  --   CREATININE 1.66* 1.36*  --   CALCIUM 7.8* 7.8*  --     Liver Function Tests: Recent Labs  Lab 07/29/17 0922  AST 47*  ALT 21  ALKPHOS 138*  BILITOT 1.1  PROT 5.1*  ALBUMIN 1.3*   No results for input(s): LIPASE, AMYLASE in the last 168 hours. No results for input(s): AMMONIA in the  last 168 hours.  CBC: Recent Labs  Lab 07/29/17 0922 07/30/17 0402  WBC 5.9 5.5  HGB 9.0* 7.8*  HCT 27.9* 24.1*  MCV 81.2 80.4  PLT 374 305    Cardiac Enzymes: Recent Labs  Lab 07/29/17 0922  TROPONINI 0.05*    BNP: Invalid input(s): POCBNP  CBG: Recent Labs  Lab 07/29/17 1652 07/29/17 1654 07/29/17 2112 07/30/17 0740 07/30/17 1112  GLUCAP 430* 406* 305* 77 119*    Microbiology: Results for orders placed or performed during the hospital encounter of 04/14/17  Aerobic/Anaerobic Culture (surgical/deep wound)     Status: None   Collection Time: 04/15/17  1:46 PM  Result Value Ref Range Status   Specimen Description LEG RIGHT LEG  Final   Special Requests NONE  Final   Gram Stain   Final    RARE WBC PRESENT, PREDOMINANTLY PMN MODERATE GRAM POSITIVE COCCI IN PAIRS IN CLUSTERS RARE GRAM NEGATIVE DIPLOCOCCI Performed at [facility], [place] [address] [place], [place] [address]    Culture   Final    MODERATE ACINETOBACTER CALCOACETICUS/BAUMANNII COMPLEX ABUNDANT STAPHYLOCOCCUS AUREUS    Report Status 04/20/2017 FINAL  Final   Organism ID, Bacteria ACINETOBACTER CALCOACETICUS/BAUMANNII COMPLEX  Final   Organism ID, Bacteria STAPHYLOCOCCUS AUREUS  Final      Susceptibility  Acinetobacter calcoaceticus/baumannii complex - MIC*    CEFTAZIDIME 8 SENSITIVE Sensitive     CEFTRIAXONE 16 INTERMEDIATE Intermediate     CIPROFLOXACIN <=0.25 SENSITIVE Sensitive     GENTAMICIN <=1 SENSITIVE Sensitive     IMIPENEM <=0.25 SENSITIVE Sensitive     PIP/TAZO <=4 SENSITIVE Sensitive     TRIMETH/SULFA <=20 SENSITIVE Sensitive     CEFEPIME 4 SENSITIVE Sensitive     AMPICILLIN/SULBACTAM <=2 SENSITIVE Sensitive     * MODERATE ACINETOBACTER CALCOACETICUS/BAUMANNII COMPLEX   Staphylococcus aureus - MIC*    CIPROFLOXACIN >=8 RESISTANT Resistant     ERYTHROMYCIN <=0.25 SENSITIVE Sensitive     GENTAMICIN <=0.5 SENSITIVE Sensitive     OXACILLIN <=0.25 SENSITIVE Sensitive      TETRACYCLINE <=1 SENSITIVE Sensitive     VANCOMYCIN <=0.5 SENSITIVE Sensitive     TRIMETH/SULFA <=10 SENSITIVE Sensitive     CLINDAMYCIN <=0.25 SENSITIVE Sensitive     RIFAMPIN <=0.5 SENSITIVE Sensitive     Inducible Clindamycin NEGATIVE Sensitive     * ABUNDANT STAPHYLOCOCCUS AUREUS    Coagulation Studies: No results for input(s): LABPROT, INR in the last 72 hours.  Urinalysis: No results for input(s): COLORURINE, LABSPEC, PHURINE, GLUCOSEU, HGBUR, BILIRUBINUR, KETONESUR, PROTEINUR, UROBILINOGEN, NITRITE, LEUKOCYTESUR in the last 72 hours.  Invalid input(s): APPERANCEUR    Imaging: Dg Chest 2 View  Result Date: 07/29/2017 CLINICAL DATA:  Chest pain EXAM: CHEST  2 VIEW COMPARISON:  07/22/2017 FINDINGS: Heart is normal size. No confluent airspace opacities. Possible minimal residual pleural effusions on the lateral view, decreased since prior study. No acute bony abnormality. IMPRESSION: Trace pleural effusions noted on the lateral view, decreased since prior study. Electronically Signed   By: Rolm Baptise M.D.   On: 07/29/2017 09:54     Medications:   . furosemide (LASIX) infusion 4 mg/hr (07/29/17 1519)   . aspirin EC  81 mg Oral Daily  . atorvastatin  40 mg Oral q1800  . FLUoxetine  40 mg Oral Daily  . heparin  5,000 Units Subcutaneous Q8H  . hydrALAZINE  25 mg Oral Q8H  . insulin aspart  0-9 Units Subcutaneous TID WC  . insulin aspart  10 Units Intravenous Once  . insulin detemir  30 Units Subcutaneous QHS  . potassium chloride  40 mEq Oral Once   acetaminophen, docusate sodium  Assessment/ Plan:  32 y.o. male with a PMHx of Diabetes mellitus type 1, hypertension, prior episodes of DKA, history of non-ST elevation myocardial infarction, peripheral vascular disease with right above-the-knee amputation, hyperlipidemiawho was [facility] severe [facility]asarca.   1. Generalized edema due to nephrotic syndrome. 2. Nephrotic  syndrome/proteinuria likely due to Diabetes Mellitus.  Negative GBM/HIV/ANCA/GBM/hepatitis screen/ANA/SPEP/UPEP 3. CKD stage II 4. Diabetes mellitus type I with CKD.  5. Hypertension. 6.  Angioedema secondary to lisinopril.   Plan:  Patient presents with recurrent generalized edema.  He has known nephrotic syndrome secondary to diabetes mellitus type 1.  We will plan to continue the patient on Lasix drip.  Also add albumin 25 g IV every 8 hours for now.  Patient cannot take ACE inhibitor or ARB secondary to angioedema previously.  Potassium noted to be low.  Administer potassium chloride 69meq PO x one.  Will continue to follow along with you.     LOS: 1 Lanelle Lindo 1/23/20191:21 PM

## 2017-07-30 NOTE — Progress Notes (Addendum)
Inpatient Diabetes Program Recommendations  AACE/ADA: New Consensus Statement on Inpatient Glycemic Control (2015)  Target Ranges:  Prepandial:   less than 140 mg/dL      Peak postprandial:   less than 180 mg/dL (1-2 hours)      Critically ill patients:  140 - 180 mg/dL  Results for [name], [name] (MRN [id number]) as of 07/30/2017 10:52  Ref. Range 07/29/2017 13:21 07/29/2017 14:03 07/29/2017 16:52 07/29/2017 16:54 07/29/2017 21:12 07/30/2017 07:40  Glucose-Capillary Latest Ref Range: 65 - 99 mg/dL 409 (H) 811 (H) 914 (H) 406 (H) 305 (H) 87   Results for [name], [name] (MRN [id number]) as of 07/30/2017 10:52  Ref. Range 04/14/2017 11:23 04/16/2017 06:54 06/10/2017 05:15  Hemoglobin A1C Latest Ref Range: 4.8 - 5.6 % 14.1 (H) 14.1 (H) 14.1 (H)    Review of Glycemic Control  Outpatient Diabetes medications: Levemir 30 units QHS, Novoog 10 units TID with meals Current orders for Inpatient glycemic control: Levemir 30 units QHS, Novolog 0-9 units TID with meals  Inpatient Diabetes Program Recommendations: Correction (SSI): Please consider ordering Novolog 0-5 units QHS for bedtime correction. Insulin - Meal Coverage: If post prandial glucose becomes consistently elevated >180 mg/dl,  please consider ordering Novolog 4 units TID with meals for meal coverage if patient eats at least 50% of meals. HgbA1C: A1C 14.1% on 06/10/17 which indicated an average glucose 358 mg/dl. A1C has been the same 14.1% since 04/14/2017.  NOTE: Patient's 3rd admission in last 6 months. Patient well known to Inpatient Diabetes Team and was seen by Diabetes Coordinator on 04/15/2017 regarding DM control.   Addendum 07/30/17@15 :07-Spoke with patient about diabetes and home regimen for diabetes control. Patient reports that he is followed by [facility] for diabetes management and currently he takes Levemir 30 units QHS and Novolog 10 units TID with meals as an outpatient for diabetes control. Patient reports that he is able  to get insulin and DM supplies from [facility]. Patient reports that his glucose is "always high". Inquired about any changes with insulin at last office visit at the clinic. Patient reports he was told to take a few extra units of insulin if his glucose was staying high.  Inquired about prior A1C and patient reports that his last A1C was high. Discussed most recent A1C results in the chart (14.1% on 06/10/17) and explained that A1C indicates an average glucose of 358 mg/dl over the past 2-3 months. Discussed glucose and A1C goals. Patient reports that his was symptomatic with glucose of 87 mg/dl this morning. Discussed hypoglycemia and explained that his body is use to his glucose being much higher so that is likely why he experienced symptoms of hypoglycemia this morning. Explained that if he is able to improve DM control, his body will get up to normal glucose values and he likely will not feel symptomatic with normal glucose values.  Discussed importance of checking CBGs and maintaining good CBG control to prevent long-term and short-term complications. Explained how hyperglycemia leads to damage within blood vessels which lead to the common complications seen with uncontrolled diabetes. Stressed to the patient the importance of improving glycemic control to prevent further complications from uncontrolled diabetes. Discussed impact of nutrition, exercise, stress, sickness, and medications on diabetes control. Expressed concern about consistently elevated A1C and hyperglycemia. Explained that he was young and his risk of having complications from uncontrolled DM was very high. Encouraged patient to check his glucose 4 times per day (before meals and at bedtime) and to keep  a log book of glucose readings and insulin taken (exact dose) which he will need to take to doctor appointments. Explained how the doctor he follows up with can use the log book to continue to make insulin adjustments if needed.  Patient does not have a follow up doctor visit already scheduled. Asked patient to call and make a follow up appointment. Patient verbalized understanding of information discussed and he states that he has no further questions at this time related to diabetes.   Thanks, Orlando PennerMarie Adelei Scobey, RN, MSN, CDE Diabetes Coordinator Inpatient Diabetes Program [phone] (Team Pager from 8am to 5pm)

## 2017-07-30 NOTE — Progress Notes (Addendum)
Patient ID: [name], male   DOB: 05/27/1986, 32 y.o.   MRN: [id number]  [facility] PROGRESS NOTE  [name] OZD:[id number] DOB: 12/14/1985 DOA: 07/29/2017 PCP: [name], [name], MD  HPI/Subjective: Patient feeling short of breath, weak and swollen all over.  Concerned about his sugars being up.  Objective: Vitals:   07/30/17 0401 07/30/17 1300  BP: (!) 159/98 (!) 161/96  Pulse: 95 97  Resp: 14 20  Temp: 98.2 F (36.8 C) 98.4 F (36.9 C)  SpO2: 100% 100%    Filed Weights   07/29/17 1353 07/30/17 0401  Weight: 118.5 kg (261 lb 4.8 oz) 113 kg (249 lb 1.6 oz)    ROS: Review of Systems  Constitutional: Negative for chills and fever.  Eyes: Negative for blurred vision.  Respiratory: Positive for shortness of breath. Negative for cough.   Cardiovascular: Negative for chest pain.  Gastrointestinal: Negative for abdominal pain, constipation, diarrhea, nausea and vomiting.  Genitourinary: Negative for dysuria.  Musculoskeletal: Negative for joint pain.  Neurological: Negative for dizziness and headaches.   Exam: Physical Exam  Constitutional: He is oriented to person, place, and time.  HENT:  Nose: No mucosal edema.  Mouth/Throat: No oropharyngeal exudate or posterior oropharyngeal edema.  Eyes: Conjunctivae, EOM and lids are normal. Pupils are equal, round, and reactive to light.  Neck: No JVD present. Carotid bruit is not present. No edema present. No thyroid mass and no thyromegaly present.  Cardiovascular: S1 normal and S2 normal. Exam reveals no gallop.  No murmur heard. Pulses:      Dorsalis pedis pulses are 2+ on the right side, and 2+ on the left side.  Respiratory: No respiratory distress. He has decreased breath sounds in the right lower field and the left lower field. He has no wheezes. He has no rhonchi. He has rales in the left lower field.  GI: Soft. Bowel sounds are normal. He exhibits distension. There is no tenderness.  Musculoskeletal:        Left ankle: He exhibits swelling.  Lymphadenopathy:    He has no cervical adenopathy.  Neurological: He is alert and oriented to person, place, and time. No cranial nerve deficit.  Skin: Skin is warm. No rash noted. Nails show no clubbing.  Psychiatric: He has a normal mood and affect.      Data Reviewed: Basic Metabolic Panel: Recent Labs  Lab 07/29/17 0922 07/30/17 0402 07/30/17 1135  NA 128* 133*  --   K 4.1 2.9* 3.2*  CL 99* 101  --   CO2 20* 26  --   GLUCOSE 507* 162*  --   BUN 27* 24*  --   CREATININE 1.66* 1.36*  --   CALCIUM 7.8* 7.8*  --   MG  --   --  1.9   Liver Function Tests: Recent Labs  Lab 07/29/17 0922  AST 47*  ALT 21  ALKPHOS 138*  BILITOT 1.1  PROT 5.1*  ALBUMIN 1.3*   CBC: Recent Labs  Lab 07/29/17 0922 07/30/17 0402  WBC 5.9 5.5  HGB 9.0* 7.8*  HCT 27.9* 24.1*  MCV 81.2 80.4  PLT 374 305   Cardiac Enzymes: Recent Labs  Lab 07/29/17 0922  TROPONINI 0.05*   BNP (last 3 results) Recent Labs    06/11/17 0518 07/22/17 1640 07/29/17 0955  BNP 45.0 149.0* 93.0     CBG: Recent Labs  Lab 07/29/17 1654 07/29/17 2112 07/30/17 0740 07/30/17 1112 07/30/17 1351  GLUCAP 406* 305* 87 119* 199*  Studies: Dg Chest 2 View  Result Date: 07/29/2017 CLINICAL DATA:  Chest pain EXAM: CHEST  2 VIEW COMPARISON:  07/22/2017 FINDINGS: Heart is normal size. No confluent airspace opacities. Possible minimal residual pleural effusions on the lateral view, decreased since prior study. No acute bony abnormality. IMPRESSION: Trace pleural effusions noted on the lateral view, decreased since prior study. Electronically Signed   By: Charlett Nose M.D.   On: 07/29/2017 09:54    Scheduled Meds: . amLODipine  5 mg Oral Daily  . aspirin EC  81 mg Oral Daily  . atorvastatin  40 mg Oral q1800  . FLUoxetine  40 mg Oral Daily  . heparin  5,000 Units Subcutaneous Q8H  . hydrALAZINE  50 mg Oral Q8H  . insulin aspart  0-5 Units Subcutaneous QHS   . insulin aspart  0-9 Units Subcutaneous TID WC  . insulin aspart  10 Units Intravenous Once  . insulin detemir  30 Units Subcutaneous QHS   Continuous Infusions: . albumin human    . furosemide (LASIX) infusion 4 mg/hr (07/29/17 1519)    Assessment/Plan:  1. Anasarca due to nephrotic syndrome and proteinuria.  Patient currently on Lasix drip at 4 mg/h.  Continue potassium supplementation. 2. Type 2 diabetes mellitus uncontrolled.  Restarted on his usual insulin and sugars much better.  Patient states that he has been compliant but his hemoglobin A1c is very elevated. 3. Chronic kidney disease stage II 4. Hypomagnesemia replace magnesium 5. Essential hypertension on Norvasc 6. Hyperlipidemia unspecified on atorvastatin 7. Depression on fluoxetine  Code Status:     Code Status Orders  (From admission, onward)        Start     Ordered   07/29/17 1356  Full code  Continuous     07/29/17 1355    Code Status History    Date Active Date Inactive Code Status Order ID Comments User Context   06/09/2017 15:22 06/12/2017 17:22 Full Code [id number]  Ramonita Lab, MD Inpatient   04/14/2017 14:05 04/18/2017 20:05 Full Code [id number]  Shaune Pollack, MD Inpatient   09/09/2016 19:55 09/11/2016 18:59 Full Code [id number]  Altamese Dilling, MD Inpatient   06/01/2016 19:26 06/02/2016 18:35 Full Code [id number]  Ramonita Lab, MD Inpatient     Disposition Plan: To be determined  Consultants:  Nephrology  Time spent: 28 minutes  Carline Dura [employer]

## 2017-07-30 NOTE — Progress Notes (Signed)
ELECTROLYTE CONSULT NOTE - INITIAL   Pharmacy Consult for electrolyte monitoring and replacement Indication: hypokalemia on furosemide drip  Allergies  Allergen Reactions  . Lisinopril Swelling   Patient Measurements: Height: 5\' 6"  (167.6 cm) Weight: 249 lb 1.6 oz (113 kg) IBW/kg (Calculated) : 63.8  Vital Signs: Temp: 98.4 F (36.9 C) (01/23 1300) Temp Source: Oral (01/23 1300) BP: 161/96 (01/23 1300) Pulse Rate: 97 (01/23 1300) Intake/Output from previous day: 01/22 0701 - 01/23 0700 In: 254.6 [P.O.:240; I.V.:14.6] Out: 2950 [Urine:2950] Intake/Output from this shift: No intake/output data recorded.  Labs: Recent Labs    07/29/17 0922 07/30/17 0402  WBC 5.9 5.5  HGB 9.0* 7.8*  HCT 27.9* 24.1*  PLT 374 305  CREATININE 1.66* 1.36*  ALBUMIN 1.3*  --   PROT 5.1*  --   AST 47*  --   ALT 21  --   ALKPHOS 138*  --   BILITOT 1.1  --   BILIDIR 0.3  --   IBILI 0.8  --    Estimated Creatinine Clearance: 91.2 mL/min (A) (by C-G formula based on SCr of 1.36 mg/dL (H)).  Assessment: Pharmacy consulted to assist with electrolyte monitoring and replacement while patient remains on furosemide continuous infusion.   K = 2.9 received 80 mEq PO KCl. Repeat K = 3.2   Per discussion with MD, will monitor electrolytes at least twice daily while on furosemide drip.  Goal of Therapy: Electrolytes WNL  Plan:  Ordered add-on Mg, will replace if low. Will give another 40 mEq PO KCl and recheck K/Mg this evening.  Cindi CarbonMary M Gunhild Bautch, PharmD, BCPS Clinical Pharmacist 07/30/2017,1:12 PM

## 2017-07-30 NOTE — Progress Notes (Signed)
ELECTROLYTE CONSULT NOTE - INITIAL   Pharmacy Consult for electrolyte monitoring and replacement Indication: hypokalemia on furosemide drip  Allergies  Allergen Reactions  . Lisinopril Swelling   Patient Measurements: Height: 5\' 6"  (167.6 cm) Weight: 249 lb 1.6 oz (113 kg) IBW/kg (Calculated) : 63.8  Vital Signs: Temp: 98.4 F (36.9 C) (01/23 1300) Temp Source: Oral (01/23 1300) BP: 161/96 (01/23 1300) Pulse Rate: 97 (01/23 1300) Intake/Output from previous day: 01/22 0701 - 01/23 0700 In: 254.6 [P.O.:240; I.V.:14.6] Out: 2950 [Urine:2950] Intake/Output from this shift: No intake/output data recorded.  Labs: Recent Labs    07/29/17 0922 07/30/17 0402 07/30/17 1135 07/30/17 1746  WBC 5.9 5.5  --   --   HGB 9.0* 7.8*  --   --   HCT 27.9* 24.1*  --   --   PLT 374 305  --   --   CREATININE 1.66* 1.36*  --  1.36*  MG  --   --  1.9 2.0  ALBUMIN 1.3*  --   --   --   PROT 5.1*  --   --   --   AST 47*  --   --   --   ALT 21  --   --   --   ALKPHOS 138*  --   --   --   BILITOT 1.1  --   --   --   BILIDIR 0.3  --   --   --   IBILI 0.8  --   --   --    Estimated Creatinine Clearance: 91.2 mL/min (A) (by C-G formula based on SCr of 1.36 mg/dL (H)).  Assessment: Pharmacy consulted to assist with electrolyte monitoring and replacement while patient remains on furosemide continuous infusion.   K = 2.9 received 80 mEq PO KCl. Repeat K = 3.2   Per discussion with MD, will monitor electrolytes at least twice daily while on furosemide drip.  Goal of Therapy: Electrolytes WNL  Plan:  Ordered add-on Mg, will replace if low. Will give another 40 mEq PO KCl and recheck K/Mg this evening.  07/30/17 1746 K 4.1, Mg 2. No further supplement warranted at this time. Will recheck with AM labs.  Carola FrostNathan A Kennisha Qin, PharmD, BCPS Clinical Pharmacist 07/30/2017,7:26 PM

## 2017-07-31 LAB — POTASSIUM: Potassium: 4.2 mmol/L (ref 3.5–5.1)

## 2017-07-31 LAB — GLUCOSE, CAPILLARY
GLUCOSE-CAPILLARY: 335 mg/dL — AB (ref 65–99)
Glucose-Capillary: 103 mg/dL — ABNORMAL HIGH (ref 65–99)
Glucose-Capillary: 151 mg/dL — ABNORMAL HIGH (ref 65–99)
Glucose-Capillary: 206 mg/dL — ABNORMAL HIGH (ref 65–99)

## 2017-07-31 LAB — PHOSPHORUS: Phosphorus: 3.1 mg/dL (ref 2.5–4.6)

## 2017-07-31 LAB — BASIC METABOLIC PANEL
Anion gap: 3 — ABNORMAL LOW (ref 5–15)
BUN: 22 mg/dL — AB (ref 6–20)
CO2: 28 mmol/L (ref 22–32)
CREATININE: 1.4 mg/dL — AB (ref 0.61–1.24)
Calcium: 8.1 mg/dL — ABNORMAL LOW (ref 8.9–10.3)
Chloride: 105 mmol/L (ref 101–111)
GFR calc Af Amer: >60 mL/min (ref >60)
Glucose, Bld: 170 mg/dL — ABNORMAL HIGH (ref 65–99)
Potassium: 3.7 mmol/L (ref 3.5–5.1)
SODIUM: 136 mmol/L (ref 135–145)

## 2017-07-31 LAB — MAGNESIUM
MAGNESIUM: 1.8 mg/dL (ref 1.7–2.4)
Magnesium: 1.9 mg/dL (ref 1.7–2.4)

## 2017-07-31 MED ORDER — CARVEDILOL 3.125 MG PO TABS
6.2500 mg | ORAL_TABLET | Freq: Two times a day (BID) | ORAL | Status: DC
  Administered 2017-07-31 – 2017-08-06 (×12): 6.25 mg via ORAL
  Filled 2017-07-31 (×12): qty 2

## 2017-07-31 MED ORDER — MAGNESIUM OXIDE 400 (241.3 MG) MG PO TABS
400.0000 mg | ORAL_TABLET | Freq: Two times a day (BID) | ORAL | Status: DC
  Administered 2017-07-31 – 2017-08-14 (×29): 400 mg via ORAL
  Filled 2017-07-31 (×29): qty 1

## 2017-07-31 NOTE — Progress Notes (Signed)
ELECTROLYTE CONSULT NOTE - INITIAL   Pharmacy Consult for electrolyte monitoring and replacement Indication: hypokalemia on furosemide drip  Allergies  Allergen Reactions  . Lisinopril Swelling   Patient Measurements: Height: 5\' 6"  (167.6 cm) Weight: 247 lb 3.2 oz (112.1 kg) IBW/kg (Calculated) : 63.8  Vital Signs: Temp: 98.2 F (36.8 C) (01/24 0324) Temp Source: Oral (01/24 0324) BP: 152/88 (01/24 0324) Pulse Rate: 102 (01/24 0324) Intake/Output from previous day: 01/23 0701 - 01/24 0700 In: 1035.1 [P.O.:840; I.V.:95.1; IV Piggyback:100] Out: 5350 [Urine:5350] Intake/Output from this shift: No intake/output data recorded.  Labs: Recent Labs    07/29/17 0922 07/30/17 0402 07/30/17 1135 07/30/17 1746 07/31/17 0412  WBC 5.9 5.5  --   --   --   HGB 9.0* 7.8*  --   --   --   HCT 27.9* 24.1*  --   --   --   PLT 374 305  --   --   --   CREATININE 1.66* 1.36*  --  1.36* 1.40*  MG  --   --  1.9 2.0 1.8  PHOS  --   --   --   --  3.1  ALBUMIN 1.3*  --   --   --   --   PROT 5.1*  --   --   --   --   AST 47*  --   --   --   --   ALT 21  --   --   --   --   ALKPHOS 138*  --   --   --   --   BILITOT 1.1  --   --   --   --   BILIDIR 0.3  --   --   --   --   IBILI 0.8  --   --   --   --    Estimated Creatinine Clearance: 88.2 mL/min (A) (by C-G formula based on SCr of 1.4 mg/dL (H)).  Assessment: Pharmacy consulted to assist with electrolyte monitoring and replacement while patient remains on furosemide continuous infusion.   K = 3.7, Phos = 3.1, Mg = 1.8 are all WNL  Per discussion with MD, will monitor electrolytes at least twice daily while on furosemide drip.  Goal of Therapy: Electrolytes WNL  Plan:  Electrolytes are WNL this morning - patient has orders for magnesium oxide 400 mg PO BID and KCl 20 mEq PO BID.  No additional supplementation needed at this time. Will recheck K/Mg this evening.  Cindi CarbonMary M Michela Herst, PharmD, BCPS Clinical Pharmacist 07/31/2017,7:35  AM

## 2017-07-31 NOTE — Progress Notes (Signed)
ELECTROLYTE CONSULT NOTE - INITIAL   Pharmacy Consult for electrolyte monitoring and replacement Indication: hypokalemia on furosemide drip  Allergies  Allergen Reactions  . Lisinopril Swelling   Patient Measurements: Height: 5\' 6"  (167.6 cm) Weight: 247 lb 3.2 oz (112.1 kg) IBW/kg (Calculated) : 63.8  Vital Signs: Temp: 98.9 F (37.2 C) (01/24 1450) Temp Source: Oral (01/24 1450) BP: 164/87 (01/24 1450) Pulse Rate: 107 (01/24 1450) Intake/Output from previous day: 01/23 0701 - 01/24 0700 In: 1035.1 [P.O.:840; I.V.:95.1; IV Piggyback:100] Out: 5350 [Urine:5350] Intake/Output from this shift: No intake/output data recorded.  Labs: Recent Labs    07/29/17 [id number] 07/30/17 0402  07/30/17 1746 07/31/17 0412 07/31/17 1832  WBC 5.9 5.5  --   --   --   --   HGB 9.0* 7.8*  --   --   --   --   HCT 27.9* 24.1*  --   --   --   --   PLT 374 305  --   --   --   --   CREATININE 1.66* 1.36*  --  1.36* 1.40*  --   MG  --   --    < > 2.0 1.8 1.9  PHOS  --   --   --   --  3.1  --   ALBUMIN 1.3*  --   --   --   --   --   PROT 5.1*  --   --   --   --   --   AST 47*  --   --   --   --   --   ALT 21  --   --   --   --   --   ALKPHOS 138*  --   --   --   --   --   BILITOT 1.1  --   --   --   --   --   BILIDIR 0.3  --   --   --   --   --   IBILI 0.8  --   --   --   --   --    < > = values in this interval not displayed.   Estimated Creatinine Clearance: 88.2 mL/min (A) (by C-G formula based on SCr of 1.4 mg/dL (H)).  Assessment: Pharmacy consulted to assist with electrolyte monitoring and replacement while patient remains on furosemide continuous infusion.   K = 3.7, Phos = 3.1, Mg = 1.8 are all WNL  Per discussion with MD, will monitor electrolytes at least twice daily while on furosemide drip.  Goal of Therapy: Electrolytes WNL  Plan:  Electrolytes are WNL this morning - patient has orders for magnesium oxide 400 mg PO BID and KCl 20 mEq PO BID.  No additional supplementation  needed at this time. Will recheck K/Mg this evening.  1/24 1832 K 4.2, Mg 1.9. No additional supplementation needed at this time. Will recheck Electrolytes with AM labs.  Carola FrostNathan A Jeremias Broyhill, PharmD, BCPS Clinical Pharmacist 07/31/2017,7:14 PM

## 2017-07-31 NOTE — Progress Notes (Signed)
Inpatient Diabetes Program Recommendations  AACE/ADA: New Consensus Statement on Inpatient Glycemic Control (2015)  Target Ranges:  Prepandial:   less than 140 mg/dL      Peak postprandial:   less than 180 mg/dL (1-2 hours)      Critically ill patients:  140 - 180 mg/dL   Results for [name], [name] (MRN [id number]) as of 07/31/2017 09:26  Ref. Range 07/30/2017 07:40 07/30/2017 11:12 07/30/2017 13:51 07/30/2017 17:14 07/30/2017 21:13  Glucose-Capillary Latest Ref Range: 65 - 99 mg/dL 87 528119 (H) 413199 (H) 244288 (H) 294 (H)   Review of Glycemic Control  Outpatient Diabetes medications: Levemir 30 units QHS, Novoog 10 units TID with meals Current orders for Inpatient glycemic control: Levemir 30 units QHS, Novolog 0-9 units TID with meals, Novolog 0-5 units QHS  Inpatient Diabetes Program Recommendations:  Insulin - Meal Coverage:Post prandial glucose consistently elevated. Please consider ordering Novolog 5 units TID with meals for meal coverage if patient eats at least 50% of meals. HgbA1C: A1C 14.1% on 06/10/17 which indicated an average glucose 358 mg/dl. A1C has been the same 14.1% since 04/14/2017.  Thanks, Orlando PennerMarie Avari Gelles, RN, MSN, CDE Diabetes Coordinator Inpatient Diabetes Program [phone] (Team Pager from 8am to 5pm)

## 2017-07-31 NOTE — Progress Notes (Signed)
Patient ID: [name], male   DOB: 05/03/1986, 32 y.o.   MRN: [id number]  [facility] PROGRESS NOTE  [name]:[id number] DOB: [birth year] DOA: 07/29/2017 PCP: Oswaldo Conroy, MD  HPI/Subjective: Patient with some shortness of breath.  Patient feels that his body is a little bit asymmetrical with the fluid.  States he is urinating well.  Objective: Vitals:   07/31/17 0324 07/31/17 1450  BP: (!) 152/88 (!) 164/87  Pulse: (!) 102 (!) 107  Resp: 14 20  Temp: 98.2 F (36.8 C) 98.9 F (37.2 C)  SpO2: 97% 98%    Filed Weights   07/29/17 1353 07/30/17 0401 07/31/17 0324  Weight: 118.5 kg (261 lb 4.8 oz) 113 kg (249 lb 1.6 oz) 112.1 kg (247 lb 3.2 oz)    ROS: Review of Systems  Constitutional: Negative for chills and fever.  Eyes: Negative for blurred vision.  Respiratory: Positive for shortness of breath. Negative for cough.   Cardiovascular: Negative for chest pain.  Gastrointestinal: Negative for abdominal pain, constipation, diarrhea, nausea and vomiting.  Genitourinary: Negative for dysuria.  Musculoskeletal: Negative for joint pain.  Neurological: Negative for dizziness and headaches.   Exam: Physical Exam  Constitutional: He is oriented to person, place, and time.  HENT:  Nose: No mucosal edema.  Mouth/Throat: No oropharyngeal exudate or posterior oropharyngeal edema.  Eyes: Conjunctivae, EOM and lids are normal. Pupils are equal, round, and reactive to light.  Neck: No JVD present. Carotid bruit is not present. No edema present. No thyroid mass and no thyromegaly present.  Cardiovascular: S1 normal and S2 normal. Exam reveals no gallop.  No murmur heard. Pulses:      Dorsalis pedis pulses are 2+ on the right side, and 2+ on the left side.  Respiratory: No respiratory distress. He has decreased breath sounds in the right lower field and the left lower field. He has no wheezes. He has no rhonchi. He has rales in the left lower field.  GI: Soft. Bowel  sounds are normal. He exhibits distension. There is no tenderness.  Musculoskeletal:       Left ankle: He exhibits swelling.  Lymphadenopathy:    He has no cervical adenopathy.  Neurological: He is alert and oriented to person, place, and time. No cranial nerve deficit.  Skin: Skin is warm. No rash noted. Nails show no clubbing.  Psychiatric: He has a normal mood and affect.      Data Reviewed: Basic Metabolic Panel: Recent Labs  Lab 07/29/17 0922 07/30/17 0402 07/30/17 1135 07/30/17 1746 07/31/17 0412  NA 128* 133*  --  133* 136  K 4.1 2.9* 3.2* 4.1 3.7  CL 99* 101  --  99* 105  CO2 20* 26  --  26 28  GLUCOSE 507* 162*  --  335* 170*  BUN 27* 24*  --  24* 22*  CREATININE 1.66* 1.36*  --  1.36* 1.40*  CALCIUM 7.8* 7.8*  --  8.1* 8.1*  MG  --   --  1.9 2.0 1.8  PHOS  --   --   --   --  3.1   Liver Function Tests: Recent Labs  Lab 07/29/17 0922  AST 47*  ALT 21  ALKPHOS 138*  BILITOT 1.1  PROT 5.1*  ALBUMIN 1.3*   CBC: Recent Labs  Lab 07/29/17 0922 07/30/17 0402  WBC 5.9 5.5  HGB 9.0* 7.8*  HCT 27.9* 24.1*  MCV 81.2 80.4  PLT 374 305   Cardiac Enzymes: Recent Labs  Lab 07/29/17 0922  TROPONINI 0.05*   BNP (last 3 results) Recent Labs    06/11/17 0518 07/22/17 1640 07/29/17 0955  BNP 45.0 149.0* 93.0     CBG: Recent Labs  Lab 07/30/17 1351 07/30/17 1714 07/30/17 2113 07/31/17 0731 07/31/17 1131  GLUCAP 199* 288* 294* 103* 151*    Scheduled Meds: . amLODipine  5 mg Oral Daily  . aspirin EC  81 mg Oral Daily  . atorvastatin  40 mg Oral q1800  . carvedilol  6.25 mg Oral BID WC  . FLUoxetine  40 mg Oral Daily  . heparin  5,000 Units Subcutaneous Q8H  . hydrALAZINE  50 mg Oral Q8H  . insulin aspart  0-5 Units Subcutaneous QHS  . insulin aspart  0-9 Units Subcutaneous TID WC  . insulin aspart  10 Units Intravenous Once  . insulin detemir  30 Units Subcutaneous QHS  . magnesium oxide  400 mg Oral BID  . potassium chloride  20 mEq  Oral BID   Continuous Infusions: . albumin human    . furosemide (LASIX) infusion 4 mg/hr (07/31/17 0802)    Assessment/Plan:  1. Anasarca due to nephrotic syndrome and proteinuria.  Patient currently on Lasix drip at 4 mg/h.  Also on albumin.  Continue potassium and magnesium supplementation. 2. Type 2 diabetes mellitus uncontrolled.  Restarted on his usual insulin and sugars much better.  Patient states that he has been compliant but his hemoglobin A1c is very elevated. 3. Chronic kidney disease stage II 4. Hypokalemia and hypomagnesemia replace magnesium and potassium orally  5. Essential hypertension and tachycardia on Norvasc, Coreg added 6. Hyperlipidemia unspecified on atorvastatin 7. Depression on fluoxetine 8. Anemia of chronic disease.  Continue to monitor hemoglobin.  Code Status:     Code Status Orders  (From admission, onward)        Start     Ordered   07/29/17 1356  Full code  Continuous     07/29/17 1355    Code Status History    Date Active Date Inactive Code Status Order ID Comments User Context   06/09/2017 15:22 06/12/2017 17:22 Full Code [id number]  Ramonita LabGouru, Aruna, MD Inpatient   04/14/2017 14:05 04/18/2017 20:05 Full Code [id number]  Shaune Pollackhen, Qing, MD Inpatient   09/09/2016 19:55 09/11/2016 18:59 Full Code [id number]  Altamese DillingVachhani, Vaibhavkumar, MD Inpatient   06/01/2016 19:26 06/02/2016 18:35 Full Code [id number]  Ramonita LabGouru, Aruna, MD Inpatient     Disposition Plan: To be determined  Consultants:  Nephrology  Time spent: 28 minutes  Aadyn Buchheit [employer]  [facility]

## 2017-07-31 NOTE — Progress Notes (Signed)
[facility]  ROUNDING NOTE   Subjective:  Urine output yesterday was 5.3 L. Tolerating Lasix and albumin quite well.   Objective:  Vital signs in last 24 hours:  Temp:  [98.2 F (36.8 C)-99.1 F (37.3 C)] 98.9 F (37.2 C) (01/24 1450) Pulse Rate:  [102-107] 107 (01/24 1450) Resp:  [14-20] 20 (01/24 1450) BP: (152-176)/(85-100) 164/87 (01/24 1450) SpO2:  [97 %-99 %] 98 % (01/24 1450) Weight:  [112.1 kg (247 lb 3.2 oz)] 112.1 kg (247 lb 3.2 oz) (01/24 0324)  Weight change: -6.396 kg (-1.6 oz) Filed Weights   07/29/17 1353 07/30/17 0401 07/31/17 0324  Weight: 118.5 kg (261 lb 4.8 oz) 113 kg (249 lb 1.6 oz) 112.1 kg (247 lb 3.2 oz)    Intake/Output: I/O last 3 completed shifts: In: 1275.1 [P.O.:1080; I.V.:95.1; IV Piggyback:100] Out: 7000 [Urine:7000]   Intake/Output this shift:  Total I/O In: 720 [P.O.:720] Out: 850 [Urine:850]  Physical Exam: General: No acute distress  Head: Normocephalic, atraumatic. Moist oral mucosal membranes  Eyes: Anicteric  Neck: Supple, trachea midline  Lungs:  Clear to auscultation, normal effort  Heart: S1S2 no rubs  Abdomen:  Soft, nontender, bowel sounds present  Extremities: LLE edema 2+ R AKA  Neurologic: Awake, alert, following commands  Skin: Venous stasis changes LLE       Basic Metabolic Panel: Recent Labs  Lab 07/29/17 0922 07/30/17 0402 07/30/17 1135 07/30/17 1746 07/31/17 0412  NA 128* 133*  --  133* 136  K 4.1 2.9* 3.2* 4.1 3.7  CL 99* 101  --  99* 105  CO2 20* 26  --  26 28  GLUCOSE 507* 162*  --  335* 170*  BUN 27* 24*  --  24* 22*  CREATININE 1.66* 1.36*  --  1.36* 1.40*  CALCIUM 7.8* 7.8*  --  8.1* 8.1*  MG  --   --  1.9 2.0 1.8  PHOS  --   --   --   --  3.1    Liver Function Tests: Recent Labs  Lab 07/29/17 0922  AST 47*  ALT 21  ALKPHOS 138*  BILITOT 1.1  PROT 5.1*  ALBUMIN 1.3*   No results for input(s): LIPASE, AMYLASE in the last 168 hours. No results for input(s): AMMONIA in  the last 168 hours.  CBC: Recent Labs  Lab 07/29/17 0922 07/30/17 0402  WBC 5.9 5.5  HGB 9.0* 7.8*  HCT 27.9* 24.1*  MCV 81.2 80.4  PLT 374 305    Cardiac Enzymes: Recent Labs  Lab 07/29/17 0922  TROPONINI 0.05*    BNP: Invalid input(s): POCBNP  CBG: Recent Labs  Lab 07/30/17 1351 07/30/17 1714 07/30/17 2113 07/31/17 0731 07/31/17 1131  GLUCAP 199* 288* 294* 103* 151*    Microbiology: Results for orders placed or performed during the hospital encounter of 04/14/17  Aerobic/Anaerobic Culture (surgical/deep wound)     Status: None   Collection Time: 04/15/17  1:46 PM  Result Value Ref Range Status   Specimen Description LEG RIGHT LEG  Final   Special Requests NONE  Final   Gram Stain   Final    RARE WBC PRESENT, PREDOMINANTLY PMN MODERATE GRAM POSITIVE COCCI IN PAIRS IN CLUSTERS RARE GRAM NEGATIVE DIPLOCOCCI Performed at [facility], [place] [address], [place], [place] [address]    Culture   Final    MODERATE ACINETOBACTER CALCOACETICUS/BAUMANNII COMPLEX ABUNDANT STAPHYLOCOCCUS AUREUS    Report Status 04/20/2017 FINAL  Final   Organism ID, Bacteria ACINETOBACTER CALCOACETICUS/BAUMANNII  COMPLEX  Final   Organism ID, Bacteria STAPHYLOCOCCUS AUREUS  Final      Susceptibility   Acinetobacter calcoaceticus/baumannii complex - MIC*    CEFTAZIDIME 8 SENSITIVE Sensitive     CEFTRIAXONE 16 INTERMEDIATE Intermediate     CIPROFLOXACIN <=0.25 SENSITIVE Sensitive     GENTAMICIN <=1 SENSITIVE Sensitive     IMIPENEM <=0.25 SENSITIVE Sensitive     PIP/TAZO <=4 SENSITIVE Sensitive     TRIMETH/SULFA <=20 SENSITIVE Sensitive     CEFEPIME 4 SENSITIVE Sensitive     AMPICILLIN/SULBACTAM <=2 SENSITIVE Sensitive     * MODERATE ACINETOBACTER CALCOACETICUS/BAUMANNII COMPLEX   Staphylococcus aureus - MIC*    CIPROFLOXACIN >=8 RESISTANT Resistant     ERYTHROMYCIN <=0.25 SENSITIVE Sensitive     GENTAMICIN <=0.5 SENSITIVE Sensitive     OXACILLIN <=0.25 SENSITIVE Sensitive      TETRACYCLINE <=1 SENSITIVE Sensitive     VANCOMYCIN <=0.5 SENSITIVE Sensitive     TRIMETH/SULFA <=10 SENSITIVE Sensitive     CLINDAMYCIN <=0.25 SENSITIVE Sensitive     RIFAMPIN <=0.5 SENSITIVE Sensitive     Inducible Clindamycin NEGATIVE Sensitive     * ABUNDANT STAPHYLOCOCCUS AUREUS    Coagulation Studies: No results for input(s): LABPROT, INR in the last 72 hours.  Urinalysis: No results for input(s): COLORURINE, LABSPEC, PHURINE, GLUCOSEU, HGBUR, BILIRUBINUR, KETONESUR, PROTEINUR, UROBILINOGEN, NITRITE, LEUKOCYTESUR in the last 72 hours.  Invalid input(s): APPERANCEUR    Imaging: No results found.   Medications:   . albumin human    . furosemide (LASIX) infusion 4 mg/hr (07/31/17 0802)   . amLODipine  5 mg Oral Daily  . aspirin EC  81 mg Oral Daily  . atorvastatin  40 mg Oral q1800  . FLUoxetine  40 mg Oral Daily  . heparin  5,000 Units Subcutaneous Q8H  . hydrALAZINE  50 mg Oral Q8H  . insulin aspart  0-5 Units Subcutaneous QHS  . insulin aspart  0-9 Units Subcutaneous TID WC  . insulin aspart  10 Units Intravenous Once  . insulin detemir  30 Units Subcutaneous QHS  . magnesium oxide  400 mg Oral BID  . potassium chloride  20 mEq Oral BID   acetaminophen, docusate sodium  Assessment/ Plan:  32 y.o. male with a PMHx of Diabetes mellitus type 1, hypertension, prior episodes of DKA, history of non-ST elevation myocardial infarction, peripheral vascular disease with right above-the-knee amputation, hyperlipidemiawho was admitted to [facility]19for evaluation of severe anasarca.   1. Generalized edema due to nephrotic syndrome. 2. Nephrotic syndrome/proteinuria likely due to Diabetes Mellitus.  Negative GBM/HIV/ANCA/GBM/hepatitis screen/ANA/SPEP/UPEP 3. CKD stage II 4. Diabetes mellitus type I with CKD.  5. Hypertension. 6.  Angioedema secondary to lisinopril.   Plan:  Maintain the patient on Lasix drip as well as albumin infusion for now.  Patient  had excellent urine output of 5.3 L yesterday.  Blood pressure remains high.  Add carvedilol 6.25 mg by mouth twice a day to his medication regimen.    LOS: 2 [name] 1/24/20193:15 PM

## 2017-08-01 ENCOUNTER — Inpatient Hospital Stay: Payer: Medicare Other

## 2017-08-01 LAB — CBC
HEMATOCRIT: 21.7 % — AB (ref 40.0–52.0)
Hemoglobin: 7.2 g/dL — ABNORMAL LOW (ref 13.0–18.0)
MCH: 26.3 pg (ref 26.0–34.0)
MCHC: 33 g/dL (ref 32.0–36.0)
MCV: 79.7 fL — ABNORMAL LOW (ref 80.0–100.0)
PLATELETS: 285 10*3/uL (ref 150–440)
RBC: 2.72 MIL/uL — AB (ref 4.40–5.90)
RDW: 14.8 % — AB (ref 11.5–14.5)
WBC: 5.5 10*3/uL (ref 3.8–10.6)

## 2017-08-01 LAB — BASIC METABOLIC PANEL
ANION GAP: 5 (ref 5–15)
BUN: 24 mg/dL — ABNORMAL HIGH (ref 6–20)
CALCIUM: 8.2 mg/dL — AB (ref 8.9–10.3)
CO2: 27 mmol/L (ref 22–32)
Chloride: 102 mmol/L (ref 101–111)
Creatinine, Ser: 1.32 mg/dL — ABNORMAL HIGH (ref 0.61–1.24)
Glucose, Bld: 188 mg/dL — ABNORMAL HIGH (ref 65–99)
POTASSIUM: 3.9 mmol/L (ref 3.5–5.1)
Sodium: 134 mmol/L — ABNORMAL LOW (ref 135–145)

## 2017-08-01 LAB — GLUCOSE, CAPILLARY
GLUCOSE-CAPILLARY: 205 mg/dL — AB (ref 65–99)
Glucose-Capillary: 114 mg/dL — ABNORMAL HIGH (ref 65–99)
Glucose-Capillary: 191 mg/dL — ABNORMAL HIGH (ref 65–99)
Glucose-Capillary: 204 mg/dL — ABNORMAL HIGH (ref 65–99)
Glucose-Capillary: 215 mg/dL — ABNORMAL HIGH (ref 65–99)

## 2017-08-01 LAB — POTASSIUM: POTASSIUM: 4.3 mmol/L (ref 3.5–5.1)

## 2017-08-01 LAB — PHOSPHORUS: PHOSPHORUS: 3.4 mg/dL (ref 2.5–4.6)

## 2017-08-01 LAB — TROPONIN I
Troponin I: 0.04 ng/mL (ref <0.03)
Troponin I: 0.04 ng/mL (ref <0.03)

## 2017-08-01 LAB — MAGNESIUM
MAGNESIUM: 1.9 mg/dL (ref 1.7–2.4)
Magnesium: 1.9 mg/dL (ref 1.7–2.4)

## 2017-08-01 MED ORDER — INSULIN ASPART 100 UNIT/ML ~~LOC~~ SOLN
5.0000 [IU] | Freq: Three times a day (TID) | SUBCUTANEOUS | Status: DC
  Administered 2017-08-01 – 2017-08-06 (×9): 5 [IU] via SUBCUTANEOUS
  Filled 2017-08-01 (×8): qty 1

## 2017-08-01 MED ORDER — VANCOMYCIN HCL IN DEXTROSE 1-5 GM/200ML-% IV SOLN: 1000.0000 mg | Freq: Two times a day (BID) | INTRAVENOUS | Status: DC

## 2017-08-01 MED ORDER — VANCOMYCIN HCL IN DEXTROSE 750-5 MG/150ML-% IV SOLN
750.0000 mg | Freq: Two times a day (BID) | INTRAVENOUS | Status: DC
  Administered 2017-08-02 – 2017-08-03 (×3): 750 mg via INTRAVENOUS
  Filled 2017-08-01 (×5): qty 150

## 2017-08-01 MED ORDER — FAMOTIDINE 20 MG PO TABS
20.0000 mg | ORAL_TABLET | Freq: Two times a day (BID) | ORAL | Status: DC
  Administered 2017-08-01 – 2017-08-14 (×27): 20 mg via ORAL
  Filled 2017-08-01 (×27): qty 1

## 2017-08-01 MED ORDER — PIPERACILLIN-TAZOBACTAM 3.375 G IVPB
3.3750 g | Freq: Three times a day (TID) | INTRAVENOUS | Status: AC
  Administered 2017-08-01 – 2017-08-05 (×13): 3.375 g via INTRAVENOUS
  Filled 2017-08-01 (×12): qty 50

## 2017-08-01 MED ORDER — VANCOMYCIN HCL IN DEXTROSE 750-5 MG/150ML-% IV SOLN
750.0000 mg | Freq: Two times a day (BID) | INTRAVENOUS | Status: DC
  Filled 2017-08-01 (×2): qty 150

## 2017-08-01 MED ORDER — ALUM & MAG HYDROXIDE-SIMETH 200-200-20 MG/5ML PO SUSP
15.0000 mL | ORAL | Status: DC | PRN
  Administered 2017-08-04 – 2017-08-05 (×2): 15 mL via ORAL
  Filled 2017-08-01 (×3): qty 30

## 2017-08-01 MED ORDER — VANCOMYCIN HCL 10 G IV SOLR
1500.0000 mg | Freq: Once | INTRAVENOUS | Status: AC
  Administered 2017-08-01: 22:00:00 1500 mg via INTRAVENOUS
  Filled 2017-08-01 (×4): qty 1500

## 2017-08-01 MED ORDER — IOPAMIDOL (ISOVUE-370) INJECTION 76%
100.0000 mL | Freq: Once | INTRAVENOUS | Status: AC | PRN
  Administered 2017-08-01: 100 mL via INTRAVENOUS

## 2017-08-01 NOTE — Progress Notes (Signed)
ELECTROLYTE CONSULT NOTE - INITIAL   Pharmacy Consult for electrolyte monitoring and replacement Indication: hypokalemia on furosemide drip  Allergies  Allergen Reactions  . Lisinopril Swelling   Patient Measurements: Height: 5\' 6"  (167.6 cm) Weight: 234 lb 9 oz (106.4 kg) IBW/kg (Calculated) : 63.8  Vital Signs: Temp: 99.9 F (37.7 C) (01/25 0453) Temp Source: Oral (01/25 0453) BP: 149/85 (01/25 0453) Pulse Rate: 102 (01/25 0453) Intake/Output from previous day: 01/24 0701 - 01/25 0700 In: 720 [P.O.:720] Out: 3250 [Urine:3250] Intake/Output from this shift: No intake/output data recorded.  Labs: Recent Labs    07/29/17 0922 07/30/17 0402  07/30/17 1746 07/31/17 0412 07/31/17 1832 08/01/17 0357  WBC 5.9 5.5  --   --   --   --  5.5  HGB 9.0* 7.8*  --   --   --   --  7.2*  HCT 27.9* 24.1*  --   --   --   --  21.7*  PLT 374 305  --   --   --   --  285  CREATININE 1.66* 1.36*  --  1.36* 1.40*  --  1.32*  MG  --   --    < > 2.0 1.8 1.9 1.9  PHOS  --   --   --   --  3.1  --  3.4  ALBUMIN 1.3*  --   --   --   --   --   --   PROT 5.1*  --   --   --   --   --   --   AST 47*  --   --   --   --   --   --   ALT 21  --   --   --   --   --   --   ALKPHOS 138*  --   --   --   --   --   --   BILITOT 1.1  --   --   --   --   --   --   BILIDIR 0.3  --   --   --   --   --   --   IBILI 0.8  --   --   --   --   --   --    < > = values in this interval not displayed.   Estimated Creatinine Clearance: 91 mL/min (A) (by C-G formula based on SCr of 1.32 mg/dL (H)).  Assessment: Pharmacy consulted to assist with electrolyte monitoring and replacement while patient remains on furosemide continuous infusion.   Furosemide drip is running at 4 mg/hr  Per discussion with MD, will monitor electrolytes at least twice daily while on furosemide drip.  Goal of Therapy: Electrolytes WNL  Plan:  K = 3.9, Phos = 3.4, Mg = 1.9 Electrolytes are WNL this morning - patient has orders for  magnesium oxide 400 mg PO BID and KCl 20 mEq PO BID.  No additional supplementation needed at this time. Will recheck K/Mg this evening.  Cindi CarbonMary M Jerret Mcbane, PharmD, BCPS Clinical Pharmacist 08/01/2017,7:26 AM

## 2017-08-01 NOTE — Plan of Care (Signed)
  Education: Knowledge of General Education information will improve 08/01/2017 2014 - Progressing by Donnel SaxonKennedy, Lawyer Washabaugh L, RN 08/01/2017 2013 - Progressing by Donnel SaxonKennedy, Duran Ohern L, RN 08/01/2017 1704 - Progressing by Donnel SaxonKennedy, Bert Ptacek L, RN   Health Behavior/Discharge Planning: Ability to manage health-related needs will improve 08/01/2017 2014 - Progressing by Donnel SaxonKennedy, Tawney Vanorman L, RN 08/01/2017 2013 - Progressing by Donnel SaxonKennedy, Wrigley Plasencia L, RN 08/01/2017 1704 - Progressing by Donnel SaxonKennedy, Fransisca Shawn L, RN   Spiritual Needs Ability to function at adequate level 08/01/2017 2014 - Progressing by Donnel SaxonKennedy, Naliya Gish L, RN 08/01/2017 2013 - Progressing by Donnel SaxonKennedy, Pacer Dorn L, RN 08/01/2017 1704 - Progressing by Donnel SaxonKennedy, Tyheem Boughner L, RN

## 2017-08-01 NOTE — Progress Notes (Signed)
Patient ID: [name], male   DOB: [birth year], 32 y.o.   MRN: [id number]  [employer] PROGRESS NOTE  [name]:[id number] DOB: Oct 18, 1985 DOA: 07/29/2017 PCP: Oswaldo Conroy, MD  HPI/Subjective: Patient denies any shortness of breath today.  Feeling better but reporting little bit of midsternal chest pain , burning sensation with no radiation, reporting headache .  States he is urinating well.  Objective: Vitals:   08/01/17 0818 08/01/17 1241  BP: (!) 147/80 (!) 146/72  Pulse: (!) 101 (!) 103  Resp: 16 16  Temp: 99.7 F (37.6 C)   SpO2: 100% 92%    Filed Weights   07/30/17 0401 07/31/17 0324 08/01/17 0453  Weight: 113 kg (249 lb 1.6 oz) 112.1 kg (247 lb 3.2 oz) 106.4 kg (234 lb 9 oz)    ROS: Review of Systems  Constitutional: Negative for chills and fever.  Eyes: Negative for blurred vision.  Respiratory: Negative for cough and shortness of breath.   Cardiovascular: Positive for chest pain.  Gastrointestinal: Negative for abdominal pain, constipation, diarrhea, nausea and vomiting.  Genitourinary: Negative for dysuria.  Musculoskeletal: Negative for joint pain.  Neurological: Negative for dizziness and headaches.   Exam: Physical Exam  Constitutional: He is oriented to person, place, and time.  HENT:  Nose: No mucosal edema.  Mouth/Throat: No oropharyngeal exudate or posterior oropharyngeal edema.  Eyes: Conjunctivae, EOM and lids are normal. Pupils are equal, round, and reactive to light.  Neck: No JVD present. Carotid bruit is not present. No edema present. No thyroid mass and no thyromegaly present.  Cardiovascular: S1 normal and S2 normal. Exam reveals no gallop.  No murmur heard. Pulses:      Dorsalis pedis pulses are 2+ on the right side, and 2+ on the left side.  Respiratory: No respiratory distress. He has decreased breath sounds in the right lower field and the left lower field. He has no wheezes. He has no rhonchi. He has no rales.  GI:  Soft. Bowel sounds are normal. He exhibits distension. There is no tenderness.  Musculoskeletal:       Left ankle: He exhibits swelling.  Lymphadenopathy:    He has no cervical adenopathy.  Neurological: He is alert and oriented to person, place, and time. No cranial nerve deficit.  Skin: Skin is warm. No rash noted. Nails show no clubbing.  Psychiatric: He has a normal mood and affect.      Data Reviewed: Basic Metabolic Panel: Recent Labs  Lab 07/29/17 0922 07/30/17 0402 07/30/17 1135 07/30/17 1746 07/31/17 0412 07/31/17 1832 08/01/17 0357  NA 128* 133*  --  133* 136  --  134*  K 4.1 2.9* 3.2* 4.1 3.7 4.2 3.9  CL 99* 101  --  99* 105  --  102  CO2 20* 26  --  26 28  --  27  GLUCOSE 507* 162*  --  335* 170*  --  188*  BUN 27* 24*  --  24* 22*  --  24*  CREATININE 1.66* 1.36*  --  1.36* 1.40*  --  1.32*  CALCIUM 7.8* 7.8*  --  8.1* 8.1*  --  8.2*  MG  --   --  1.9 2.0 1.8 1.9 1.9  PHOS  --   --   --   --  3.1  --  3.4   Liver Function Tests: Recent Labs  Lab 07/29/17 0922  AST 47*  ALT 21  ALKPHOS 138*  BILITOT 1.1  PROT 5.1*  ALBUMIN  1.3*   CBC: Recent Labs  Lab 07/29/17 0922 07/30/17 0402 08/01/17 0357  WBC 5.9 5.5 5.5  HGB 9.0* 7.8* 7.2*  HCT 27.9* 24.1* 21.7*  MCV 81.2 80.4 79.7*  PLT 374 305 285   Cardiac Enzymes: Recent Labs  Lab 07/29/17 0922  TROPONINI 0.05*   BNP (last 3 results) Recent Labs    06/11/17 0518 07/22/17 1640 07/29/17 0955  BNP 45.0 149.0* 93.0     CBG: Recent Labs  Lab 07/31/17 1630 07/31/17 2059 08/01/17 0730 08/01/17 1135 08/01/17 1250  GLUCAP 206* 335* 114* 191* 204*    Scheduled Meds: . amLODipine  5 mg Oral Daily  . aspirin EC  81 mg Oral Daily  . atorvastatin  40 mg Oral q1800  . carvedilol  6.25 mg Oral BID WC  . FLUoxetine  40 mg Oral Daily  . heparin  5,000 Units Subcutaneous Q8H  . hydrALAZINE  50 mg Oral Q8H  . insulin aspart  0-5 Units Subcutaneous QHS  . insulin aspart  0-9 Units  Subcutaneous TID WC  . insulin aspart  10 Units Intravenous Once  . insulin aspart  5 Units Subcutaneous TID WC  . insulin detemir  30 Units Subcutaneous QHS  . magnesium oxide  400 mg Oral BID  . potassium chloride  20 mEq Oral BID   Continuous Infusions: . albumin human    . furosemide (LASIX) infusion 4 mg/hr (08/01/17 1054)    Assessment/Plan:  1. Anasarca due to nephrotic syndrome and proteinuria.  Patient currently on Lasix drip at 4 mg/h.  Also on albumin.  Continue potassium and magnesium supplementation.      Daily weight monitoring and nephrology is following.  Urine output 3250 mL in the past 24 hours 2. Type 2 diabetes mellitus uncontrolled.  Restarted on his usual insulin and sugars much better.  Patient states that he has been compliant but his hemoglobin A1c is very elevated.  NovoLog 5 units 3 times daily with each meal 3. Chronic kidney disease stage II 4. Hypokalemia and hypomagnesemia replace magnesium and potassium orally prn 5. Essential hypertension and tachycardia on Norvasc, Coreg added.  Continue hydralazine and titrate as needed 6. Hyperlipidemia unspecified on atorvastatin 7. Depression on fluoxetine 8. Anemia of chronic disease.  Continue to monitor hemoglobin. 9. Chest pain atypical-check troponin.  Pepcid is added to regimen.  Continue aspirin, Coreg and statin  Code Status:     Code Status Orders  (From admission, onward)        Start     Ordered   07/29/17 1356  Full code  Continuous     07/29/17 1355    Code Status History    Date Active Date Inactive Code Status Order ID Comments User Context   06/09/2017 15:22 06/12/2017 17:22 Full Code [id number]  Ramonita LabGouru, Keysean Savino, MD Inpatient   04/14/2017 14:05 04/18/2017 20:05 Full Code [id number]  Shaune Pollackhen, Qing, MD Inpatient   09/09/2016 19:55 09/11/2016 18:59 Full Code [id number]  Altamese DillingVachhani, Vaibhavkumar, MD Inpatient   06/01/2016 19:26 06/02/2016 18:35 Full Code [id number]  Ramonita LabGouru, Treasa Bradshaw, MD Inpatient     Disposition  Plan: To be determined  Consultants:  Nephrology  Time spent: 32 minutes  [name]  [employer]

## 2017-08-01 NOTE — Progress Notes (Addendum)
Patient's temperature 100.4, having chills/ sweating, notified Dr. Amado CoeGouru, received verbal order for chest x ray, blood culture and urine analysis. Also requested to Dr. Amado CoeGouru for telemetry per patient's HR sustaining at 115. Received order for telemetry. Read chest x ray results and oral temperature 100.5 after tylenol to Dr. Luberta MutterKonidena, received orders for CT with contrast of the chest. Vancomycin and zosyn orders received as well while patient was in CT.

## 2017-08-01 NOTE — Progress Notes (Signed)
Inpatient Diabetes Program Recommendations  AACE/ADA: New Consensus Statement on Inpatient Glycemic Control (2015)  Target Ranges:  Prepandial:   less than 140 mg/dL      Peak postprandial:   less than 180 mg/dL (1-2 hours)      Critically ill patients:  140 - 180 mg/dL   Lab Results  Component Value Date   GLUCAP 191 (H) 08/01/2017   HGBA1C 14.1 (H) 06/10/2017    Review of Glycemic Control   Results for [name], [name] (MRN [id number]) as of 08/01/2017 11:52  Ref. Range 07/31/2017 11:31 07/31/2017 16:30 07/31/2017 20:59 08/01/2017 07:30 08/01/2017 11:35  Glucose-Capillary Latest Ref Range: 65 - 99 mg/dL 409151 (H) 811206 (H) 914335 (H) 114 (H) 191 (H)    Outpatient Diabetes medications: Levemir 30 units QHS, Novolog 10 units TID with meals  Current orders for Inpatient glycemic control:Levemir 30 units QHS, Novolog 0-9 units TID with meals, Novolog 0-5 units QHS  Inpatient Diabetes Program Recommendations: Please consider ordering Novolog 5 units TID with meals for meal coverage if patient eats at least 50% of meals.  Susette RacerJulie Fionnuala Hemmerich, RN, BA, MHA, CDE Diabetes Coordinator Inpatient Diabetes Program  [phone] (Team Pager) [phone] [facility] Office) 08/01/2017 11:54 AM

## 2017-08-01 NOTE — Plan of Care (Signed)
  Education: Knowledge of General Education information will improve 08/01/2017 1704 - Progressing by Donnel SaxonKennedy, Shanielle Correll L, RN   Health Behavior/Discharge Planning: Ability to manage health-related needs will improve 08/01/2017 1704 - Progressing by Donnel SaxonKennedy, Derrick Orris L, RN   Spiritual Needs Ability to function at adequate level 08/01/2017 1704 - Progressing by Donnel SaxonKennedy, Shataria Crist L, RN

## 2017-08-01 NOTE — Progress Notes (Signed)
[facility]  ROUNDING NOTE   Subjective:  Continues to have good diuresis.  Remains on lasix gtt.  Renal function stable.    Objective:  Vital signs in last 24 hours:  Temp:  [98 F (36.7 C)-99.9 F (37.7 C)] 99.7 F (37.6 C) (01/25 0818) Pulse Rate:  [101-108] 101 (01/25 0818) Resp:  [16-24] 16 (01/25 0818) BP: (147-164)/(80-92) 147/80 (01/25 0818) SpO2:  [95 %-100 %] 100 % (01/25 0818) Weight:  [106.4 kg (234 lb 9 oz)] 106.4 kg (234 lb 9 oz) (01/25 0453)  Weight change: -5.732 kg (-10.2 oz) Filed Weights   07/30/17 0401 07/31/17 0324 08/01/17 0453  Weight: 113 kg (249 lb 1.6 oz) 112.1 kg (247 lb 3.2 oz) 106.4 kg (234 lb 9 oz)    Intake/Output: I/O last 3 completed shifts: In: 41 [P.O.:720] Out: 6850 [Urine:6850]   Intake/Output this shift:  Total I/O In: 240 [P.O.:240] Out: -   Physical Exam: General: No acute distress  Head: Normocephalic, atraumatic. Moist oral mucosal membranes  Eyes: Anicteric  Neck: Supple, trachea midline  Lungs:  Clear to auscultation, normal effort  Heart: S1S2 no rubs  Abdomen:  Soft, nontender, bowel sounds present  Extremities: LLE edema 2+ R AKA  Neurologic: Awake, alert, following commands  Skin: Venous stasis changes LLE       Basic Metabolic Panel: Recent Labs  Lab 07/29/17 0922 07/30/17 0402 07/30/17 1135 07/30/17 1746 07/31/17 0412 07/31/17 1832 08/01/17 0357  NA 128* 133*  --  133* 136  --  134*  K 4.1 2.9* 3.2* 4.1 3.7 4.2 3.9  CL 99* 101  --  99* 105  --  102  CO2 20* 26  --  26 28  --  27  GLUCOSE 507* 162*  --  335* 170*  --  188*  BUN 27* 24*  --  24* 22*  --  24*  CREATININE 1.66* 1.36*  --  1.36* 1.40*  --  1.32*  CALCIUM 7.8* 7.8*  --  8.1* 8.1*  --  8.2*  MG  --   --  1.9 2.0 1.8 1.9 1.9  PHOS  --   --   --   --  3.1  --  3.4    Liver Function Tests: Recent Labs  Lab 07/29/17 0922  AST 47*  ALT 21  ALKPHOS 138*  BILITOT 1.1  PROT 5.1*  ALBUMIN 1.3*   No results for  input(s): LIPASE, AMYLASE in the last 168 hours. No results for input(s): AMMONIA in the last 168 hours.  CBC: Recent Labs  Lab 07/29/17 0922 07/30/17 0402 08/01/17 0357  WBC 5.9 5.5 5.5  HGB 9.0* 7.8* 7.2*  HCT 27.9* 24.1* 21.7*  MCV 81.2 80.4 79.7*  PLT 374 305 285    Cardiac Enzymes: Recent Labs  Lab 07/29/17 0922  TROPONINI 0.05*    BNP: Invalid input(s): POCBNP  CBG: Recent Labs  Lab 07/31/17 0731 07/31/17 1131 07/31/17 1630 07/31/17 2059 08/01/17 0730  GLUCAP 103* 151* 206* 335* 114*    Microbiology: Results for orders placed or performed during the hospital encounter of 04/14/17  Aerobic/Anaerobic Culture (surgical/deep wound)     Status: None   Collection Time: 04/15/17  1:46 PM  Result Value Ref Range Status   Specimen Description LEG RIGHT LEG  Final   Special Requests NONE  Final   Gram Stain   Final    RARE WBC PRESENT, PREDOMINANTLY PMN MODERATE GRAM POSITIVE COCCI IN PAIRS IN CLUSTERS RARE GRAM NEGATIVE  DIPLOCOCCI Performed at [facility], [place] [address], [place], [place] [address]    Culture   Final    MODERATE ACINETOBACTER CALCOACETICUS/BAUMANNII COMPLEX ABUNDANT STAPHYLOCOCCUS AUREUS    Report Status 04/20/2017 FINAL  Final   Organism ID, Bacteria ACINETOBACTER CALCOACETICUS/BAUMANNII COMPLEX  Final   Organism ID, Bacteria STAPHYLOCOCCUS AUREUS  Final      Susceptibility   Acinetobacter calcoaceticus/baumannii complex - MIC*    CEFTAZIDIME 8 SENSITIVE Sensitive     CEFTRIAXONE 16 INTERMEDIATE Intermediate     CIPROFLOXACIN <=0.25 SENSITIVE Sensitive     GENTAMICIN <=1 SENSITIVE Sensitive     IMIPENEM <=0.25 SENSITIVE Sensitive     PIP/TAZO <=4 SENSITIVE Sensitive     TRIMETH/SULFA <=20 SENSITIVE Sensitive     CEFEPIME 4 SENSITIVE Sensitive     AMPICILLIN/SULBACTAM <=2 SENSITIVE Sensitive     * MODERATE ACINETOBACTER CALCOACETICUS/BAUMANNII COMPLEX   Staphylococcus aureus - MIC*    CIPROFLOXACIN >=8 RESISTANT Resistant      ERYTHROMYCIN <=0.25 SENSITIVE Sensitive     GENTAMICIN <=0.5 SENSITIVE Sensitive     OXACILLIN <=0.25 SENSITIVE Sensitive     TETRACYCLINE <=1 SENSITIVE Sensitive     VANCOMYCIN <=0.5 SENSITIVE Sensitive     TRIMETH/SULFA <=10 SENSITIVE Sensitive     CLINDAMYCIN <=0.25 SENSITIVE Sensitive     RIFAMPIN <=0.5 SENSITIVE Sensitive     Inducible Clindamycin NEGATIVE Sensitive     * ABUNDANT STAPHYLOCOCCUS AUREUS    Coagulation Studies: No results for input(s): LABPROT, INR in the last 72 hours.  Urinalysis: No results for input(s): COLORURINE, LABSPEC, PHURINE, GLUCOSEU, HGBUR, BILIRUBINUR, KETONESUR, PROTEINUR, UROBILINOGEN, NITRITE, LEUKOCYTESUR in the last 72 hours.  Invalid input(s): APPERANCEUR    Imaging: No results found.   Medications:   . albumin human    . furosemide (LASIX) infusion 4 mg/hr (08/01/17 1054)   . amLODipine  5 mg Oral Daily  . aspirin EC  81 mg Oral Daily  . atorvastatin  40 mg Oral q1800  . carvedilol  6.25 mg Oral BID WC  . FLUoxetine  40 mg Oral Daily  . heparin  5,000 Units Subcutaneous Q8H  . hydrALAZINE  50 mg Oral Q8H  . insulin aspart  0-5 Units Subcutaneous QHS  . insulin aspart  0-9 Units Subcutaneous TID WC  . insulin aspart  10 Units Intravenous Once  . insulin detemir  30 Units Subcutaneous QHS  . magnesium oxide  400 mg Oral BID  . potassium chloride  20 mEq Oral BID   acetaminophen, docusate sodium  Assessment/ Plan:  32 y.o. male with a PMHx of Diabetes mellitus type 1, hypertension, prior episodes of DKA, history of non-ST elevation myocardial infarction, peripheral vascular disease with right above-the-knee amputation, hyperlipidemiawho was [facility] of severe anasarca.   1. Generalized edema due to nephrotic syndrome. 2. Nephrotic syndrome/proteinuria likely due to Diabetes Mellitus.  Negative GBM/HIV/ANCA/GBM/hepatitis screen/ANA/SPEP/UPEP 3. CKD stage II 4. Diabetes mellitus type I with  CKD.  5. Hypertension. 6.  Angioedema secondary to lisinopril.   Plan:  Patient's weight is down 6 kg as compared to the 24th.  Therefore at this time we will maintain the patient on Lasix drip along with albumin combination.  Continue magnesium and potassium repletion as well.  Blood pressure has also improved and is currently 147/80.  Continue amlodipine, carvedilol, and hydralazine.   LOS: 3 [name] 1/25/201911:49 AM

## 2017-08-01 NOTE — Progress Notes (Signed)
Patient complaining of chest tightness 6/10 pain. No SOB, no pain throughout body. Patient did have some sweating, thermostat on 80 degrees in room, adjusted temperature to 70. Notified Dr. Amado CoeGouru of complaint, received verbal order for troponin. Administered prn tylenol 650mg  and ginger ale to help the patient burp, seems to be acid reflux. Patient states he has had episodes of acid reflux before with centralized discomfort in chest.  Patient stated discomfort subsided after tylenol and ginger ale, states he has been burping.

## 2017-08-01 NOTE — Plan of Care (Signed)
  Education: Knowledge of General Education information will improve 08/01/2017 2013 - Progressing by Donnel SaxonKennedy, Lisle Skillman L, RN 08/01/2017 1704 - Progressing by Donnel SaxonKennedy, Corwyn Vora L, RN   Health Behavior/Discharge Planning: Ability to manage health-related needs will improve 08/01/2017 2013 - Progressing by Donnel SaxonKennedy, Kaizlee Carlino L, RN 08/01/2017 1704 - Progressing by Donnel SaxonKennedy, Jayion Schneck L, RN   Spiritual Needs Ability to function at adequate level 08/01/2017 2013 - Progressing by Donnel SaxonKennedy, Aislynn Cifelli L, RN 08/01/2017 1704 - Progressing by Donnel SaxonKennedy, Jacalyn Biggs L, RN

## 2017-08-01 NOTE — Evaluation (Signed)
Physical Therapy Evaluation Patient Details Name: [name] MRN: [id number] DOB: 1986/01/24 Today's Date: 08/01/2017   History of Present Illness  Pt admitted for anasarca with complaints of chest pain. Pt with history of AKI, DM, HTN, R AKA, and NSTEMI, and hyponatremia. Pt with recent admission for similar symptoms.  Clinical Impression  Pt is a pleasant 32 year old male who was admitted for anasarca. Pt reports no chest pain at this time, cleared to participate with therapy per RN. Pt performs bed mobility with cga, transfers with mod I, and ambulation with supervision and RW. Due evaluation, sat at EOB for extended time performing dynamic balance with supervision trying to don prothesis. Due to swelling, unable to fully don correctly. Pt then able to stand and transfer to recliner. SOB symptoms noted. Pt demonstrates deficits with strength/mobility/endurance. Would benefit from skilled PT to address above deficits and promote optimal return to PLOF. Recommend follow up in out patient setting for continued strengthening/endurance training.      Follow Up Recommendations Outpatient PT    Equipment Recommendations  None recommended by PT    Recommendations for Other Services       Precautions / Restrictions Precautions Precautions: Fall Restrictions Weight Bearing Restrictions: No      Mobility  Bed Mobility Overal bed mobility: Needs Assistance Bed Mobility: Supine to Sit     Supine to sit: Min guard     General bed mobility comments: safe technique with slight assist for trunk mobility. Once seated at EOB, able to sit with upright posture.   Transfers Overall transfer level: Modified independent Equipment used: Rolling walker (2 wheeled)             General transfer comment: Prior to transfer, attempted to don R AKA prosthesis. DUe to edema, unable to don at this time. Able to transfer with mod I with RW. Upright posture noted and safe  technique  Ambulation/Gait Ambulation/Gait assistance: Supervision Ambulation Distance (Feet): 3 Feet Assistive device: Rolling walker (2 wheeled) Gait Pattern/deviations: Step-to pattern     General Gait Details: able to hop towards recliner with safe technique on L foot. Controlled descent back to chair. Slight SOB symptoms with exertion.   Stairs            Wheelchair Mobility    Modified Rankin (Stroke Patients Only)       Balance Overall balance assessment: History of Falls;Needs assistance Sitting-balance support: No upper extremity supported Sitting balance-Leahy Scale: Normal     Standing balance support: Bilateral upper extremity supported Standing balance-Leahy Scale: Good                               Pertinent Vitals/Pain Pain Assessment: No/denies pain    Home Living Family/patient expects to be discharged to:: Private residence Living Arrangements: (friend) Available Help at Discharge: Friend(s) Type of Home: House Home Access: Ramped entrance     Home Layout: One level Home Equipment: Environmental consultant - 2 wheels;Cane - single point      Prior Function Level of Independence: Independent with assistive device(s)         Comments: 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 Reports he recently has been using RW secondary to weakness.     Hand Dominance        Extremity/Trunk Assessment   Upper Extremity Assessment Upper Extremity Assessment: Generalized weakness(B UE grossly 4+/5)    Lower Extremity Assessment Lower Extremity Assessment: Generalized  weakness(L LE grossly 4+/5; R LE grossly 3/5)       Communication   Communication: No difficulties  Cognition Arousal/Alertness: Awake/alert Behavior During Therapy: WFL for tasks assessed/performed Overall Cognitive Status: Within Functional Limits for tasks assessed                                        General Comments      Exercises      Assessment/Plan    PT Assessment Patient needs continued PT services  PT Problem List Decreased strength;Decreased balance;Cardiopulmonary status limiting activity       PT Treatment Interventions Gait training;Therapeutic exercise    PT Goals (Current goals can be found in the Care Plan section)  Acute Rehab PT Goals Patient Stated Goal: to be less SOB PT Goal Formulation: With patient Time For Goal Achievement: 08/15/17 Potential to Achieve Goals: Good    Frequency Min 2X/week   Barriers to discharge        Co-evaluation               AM-PAC PT "6 Clicks" Daily Activity  Outcome Measure Difficulty turning over in bed (including adjusting bedclothes, sheets and blankets)?: Unable Difficulty moving from lying on back to sitting on the side of the bed? : Unable Difficulty sitting down on and standing up from a chair with arms (e.g., wheelchair, bedside commode, etc,.)?: None Help needed moving to and from a bed to chair (including a wheelchair)?: A Little Help needed walking in hospital room?: A Little Help needed climbing 3-5 steps with a railing? : A Little 6 Click Score: 15    End of Session Equipment Utilized During Treatment: Gait belt Activity Tolerance: Patient tolerated treatment well Patient left: in chair Nurse Communication: Mobility status PT Visit Diagnosis: Unsteadiness on feet (R26.81);Muscle weakness (generalized) (M62.81);Difficulty in walking, not elsewhere classified (R26.2)    Time: [id number] PT Time Calculation (min) (ACUTE ONLY): 22 min   Charges:   PT Evaluation $PT Eval Low Complexity: 1 Low PT Treatments $Therapeutic Activity: 8-22 mins   PT G CodesElizabeth Gordon:        [name], PT, DPT [phone]   [name] 08/01/2017, 5:03 PM

## 2017-08-01 NOTE — Progress Notes (Signed)
Notified Dr. Amado CoeGouru of troponin result at 0.04. No new orders. Patient is lying in bed resting. He states he has no pain or chest discomfort.

## 2017-08-01 NOTE — Consult Note (Signed)
Pharmacy Antibiotic Note  [name] is a 32 y.o. male admitted on 07/29/2017 with anasarca, nephrotic syndrome now with fever.  Pharmacy has been consulted for vancomycin/zosyn dosing.  Plan: Zosyn 3.375g IV q8h (4 hour infusion). vancomycin 1500mg  once. Will give next dose in 6 hours for stacked dosing.   Vancomycin 750mg  q 12 hours Due to pt size, risk of accumulation and renal concerns (pt on lasix drip) will dose cautiously Trough prior to the 5th dose   Height: 5\' 6"  (167.6 cm) Weight: 234 lb 14.4 oz (106.5 kg) IBW/kg (Calculated) : 63.8  Temp (24hrs), Avg:99.5 F (37.5 C), Min:98 F (36.7 C), Max:100.5 F (38.1 C)  Recent Labs  Lab 07/29/17 0922 07/30/17 0402 07/30/17 1746 07/31/17 0412 08/01/17 0357  WBC 5.9 5.5  --   --  5.5  CREATININE 1.66* 1.36* 1.36* 1.40* 1.32*    Estimated Creatinine Clearance: 91.1 mL/min (A) (by C-G formula based on SCr of 1.32 mg/dL (H)).    Allergies  Allergen Reactions  . Lisinopril Swelling    Antimicrobials this admission: vancomycin 1/25 >>  zosyn 1/25 >>   Dose adjustments this admission:   Microbiology results: 1/25 BCx: ordered  Thank you for allowing pharmacy to be a part of this patient's care.  Olene FlossMelissa D Maccia, Pharm.D, BCPS Clinical Pharmacist  08/01/2017 6:06 PM

## 2017-08-01 NOTE — Progress Notes (Signed)
ELECTROLYTE CONSULT NOTE - INITIAL   Pharmacy Consult for electrolyte monitoring and replacement Indication: hypokalemia on furosemide drip  Allergies  Allergen Reactions  . Lisinopril Swelling   Patient Measurements: Height: 5\' 6"  (167.6 cm) Weight: 234 lb 14.4 oz (106.5 kg) IBW/kg (Calculated) : 63.8  Vital Signs: Temp: 100.5 F (38.1 C) (01/25 1743) Temp Source: Oral (01/25 1743) BP: 164/92 (01/25 1744) Pulse Rate: 103 (01/25 1241) Intake/Output from previous day: 01/24 0701 - 01/25 0700 In: 720 [P.O.:720] Out: 3250 [Urine:3250] Intake/Output from this shift: No intake/output data recorded.  Labs: Recent Labs    07/30/17 0402  07/30/17 1746 07/31/17 0412 07/31/17 1832 08/01/17 0357 08/01/17 1754  WBC 5.5  --   --   --   --  5.5  --   HGB 7.8*  --   --   --   --  7.2*  --   HCT 24.1*  --   --   --   --  21.7*  --   PLT 305  --   --   --   --  285  --   CREATININE 1.36*  --  1.36* 1.40*  --  1.32*  --   MG  --    < > 2.0 1.8 1.9 1.9 1.9  PHOS  --   --   --  3.1  --  3.4  --    < > = values in this interval not displayed.   Estimated Creatinine Clearance: 91.1 mL/min (A) (by C-G formula based on SCr of 1.32 mg/dL (H)).  Assessment: Pharmacy consulted to assist with electrolyte monitoring and replacement while patient remains on furosemide continuous infusion.   Furosemide drip is running at 4 mg/hr  Per discussion with MD, will monitor electrolytes at least twice daily while on furosemide drip.  Goal of Therapy: Electrolytes WNL  Plan:  K = 4.3, Phos = 3.4, Mg = 1.9 Electrolytes are WNL  - patient has orders for magnesium oxide 400 mg PO BID and KCl 20 mEq PO BID.  No additional supplementation needed at this time. Will recheck K/Mg in the AM  Olene FlossMelissa D Leemon Ayala, PharmD, BCPS Clinical Pharmacist 08/01/2017,7:20 PM

## 2017-08-02 LAB — BASIC METABOLIC PANEL
ANION GAP: 7 (ref 5–15)
BUN: 26 mg/dL — ABNORMAL HIGH (ref 6–20)
CALCIUM: 7.9 mg/dL — AB (ref 8.9–10.3)
CO2: 26 mmol/L (ref 22–32)
CREATININE: 1.74 mg/dL — AB (ref 0.61–1.24)
Chloride: 102 mmol/L (ref 101–111)
GFR calc Af Amer: 58 mL/min — ABNORMAL LOW (ref >60)
GFR calc non Af Amer: 50 mL/min — ABNORMAL LOW (ref >60)
GLUCOSE: 207 mg/dL — AB (ref 65–99)
Potassium: 4 mmol/L (ref 3.5–5.1)
Sodium: 135 mmol/L (ref 135–145)

## 2017-08-02 LAB — POTASSIUM: Potassium: 4.1 mmol/L (ref 3.5–5.1)

## 2017-08-02 LAB — LACTIC ACID, PLASMA
Lactic Acid, Venous: 1.2 mmol/L (ref 0.5–1.9)
Lactic Acid, Venous: 1.1 mmol/L (ref 0.5–1.9)

## 2017-08-02 LAB — GLUCOSE, CAPILLARY
GLUCOSE-CAPILLARY: 145 mg/dL — AB (ref 65–99)
GLUCOSE-CAPILLARY: 127 mg/dL — AB (ref 65–99)
Glucose-Capillary: 106 mg/dL — ABNORMAL HIGH (ref 65–99)
Glucose-Capillary: 246 mg/dL — ABNORMAL HIGH (ref 65–99)

## 2017-08-02 LAB — MAGNESIUM
MAGNESIUM: 2.1 mg/dL (ref 1.7–2.4)
Magnesium: 1.8 mg/dL (ref 1.7–2.4)

## 2017-08-02 LAB — INFLUENZA PANEL BY PCR (TYPE A & B)
INFLAPCR: NEGATIVE
Influenza B By PCR: NEGATIVE

## 2017-08-02 LAB — TROPONIN I: Troponin I: 0.06 ng/mL (ref <0.03)

## 2017-08-02 LAB — PROCALCITONIN: PROCALCITONIN: 1.78 ng/mL

## 2017-08-02 MED ORDER — HEPARIN (PORCINE) IN NACL 100-0.45 UNIT/ML-% IJ SOLN: 1400.0000 [IU]/h | INTRAMUSCULAR | Status: DC

## 2017-08-02 MED ORDER — HEPARIN BOLUS VIA INFUSION
3000.0000 [IU] | Freq: Once | INTRAVENOUS | Status: DC
  Filled 2017-08-02: qty 3000

## 2017-08-02 NOTE — Progress Notes (Signed)
ELECTROLYTE CONSULT NOTE   Pharmacy Consult for electrolyte monitoring and replacement Indication: hypokalemia on furosemide drip  Allergies  Allergen Reactions  . Lisinopril Swelling   Patient Measurements: Height: 5\' 6"  (167.6 cm) Weight: 240 lb (108.9 kg) IBW/kg (Calculated) : 63.8  Vital Signs: Temp: 101 F (38.3 C) (01/26 0735) Temp Source: Oral (01/26 0735) BP: 139/77 (01/26 0840) Pulse Rate: 105 (01/26 0840) Intake/Output from previous day: 01/25 0701 - 01/26 0700 In: 480 [P.O.:480] Out: 2350 [Urine:2350] Intake/Output from this shift: Total I/O In: 230 [P.O.:230] Out: -   Labs: Recent Labs    07/31/17 0412  08/01/17 0357 08/01/17 1754 08/02/17 0154  WBC  --   --  5.5  --   --   HGB  --   --  7.2*  --   --   HCT  --   --  21.7*  --   --   PLT  --   --  285  --   --   CREATININE 1.40*  --  1.32*  --  1.74*  MG 1.8   < > 1.9 1.9 1.8  PHOS 3.1  --  3.4  --   --    < > = values in this interval not displayed.   Estimated Creatinine Clearance: 69.9 mL/min (A) (by C-G formula based on SCr of 1.74 mg/dL (H)).  Assessment: Pharmacy consulted to assist with electrolyte monitoring and replacement while patient remains on furosemide continuous infusion.   Furosemide drip is running at 4 mg/hr  Per discussion with MD, will monitor electrolytes at least twice daily while on furosemide drip.  Goal of Therapy: Electrolytes WNL  Plan:  K = 4.0, Mg = 1.8 Electrolytes are WNL  - patient has orders for magnesium oxide 400 mg PO BID and KCl 20 mEq PO BID.  No additional supplementation needed at this time. Will recheck K/Mg at 1800 and in the AM  Marty HeckWang, Zyion Doxtater L, PharmD, BCPS Clinical Pharmacist 08/02/2017,9:56 AM

## 2017-08-02 NOTE — Progress Notes (Signed)
Blood sugar running lower than normal, not ready to eat dinner, does not want insulin at this time re: concerns for hypoglycemia, will continue to monitor.

## 2017-08-02 NOTE — Progress Notes (Signed)
Febrile throughout shift, Tylenol given twice, - for flu, lactic acid normal, lasix d/c due to kidney function. BM today. 1 episode of emesis, eating well, continues on IV abx for pne.

## 2017-08-02 NOTE — Progress Notes (Signed)
Patient ID: [name], male   DOB: 1985-09-13, 32 y.o.   MRN: [id number]  [facility] PROGRESS NOTE  [name] ATF:[id number] DOB: Jan 28, 1986 DOA: 07/29/2017 PCP: Oswaldo Conroy, MD  HPI/Subjective: Patient was febrile last night.  Reporting cough.  Chest x-ray has revealed pneumonia  Objective: Vitals:   08/02/17 1218 08/02/17 1322  BP:  128/83  Pulse:  96  Resp:  20  Temp: 98.1 F (36.7 C) 98.4 F (36.9 C)  SpO2:  90%    Filed Weights   08/01/17 0453 08/01/17 1300 08/02/17 0417  Weight: 106.4 kg (234 lb 9 oz) 106.5 kg (234 lb 14.4 oz) 108.9 kg (240 lb)    ROS: Review of Systems  Constitutional: Negative for chills and fever.  Eyes: Negative for blurred vision.  Respiratory: Positive for cough and sputum production. Negative for shortness of breath.   Cardiovascular: Negative for chest pain.  Gastrointestinal: Negative for abdominal pain, constipation, diarrhea, nausea and vomiting.  Genitourinary: Negative for dysuria.  Musculoskeletal: Negative for joint pain.  Neurological: Negative for dizziness and headaches.   Exam: Physical Exam  Constitutional: He is oriented to person, place, and time.  HENT:  Nose: No mucosal edema.  Mouth/Throat: No oropharyngeal exudate or posterior oropharyngeal edema.  Eyes: Conjunctivae, EOM and lids are normal. Pupils are equal, round, and reactive to light.  Neck: No JVD present. Carotid bruit is not present. No edema present. No thyroid mass and no thyromegaly present.  Cardiovascular: S1 normal and S2 normal. Exam reveals no gallop.  No murmur heard. Pulses:      Dorsalis pedis pulses are 2+ on the right side, and 2+ on the left side.  Respiratory: No respiratory distress. He has decreased breath sounds in the right lower field and the left lower field. He has no wheezes. He has no rhonchi. He has no rales.  GI: Soft. Bowel sounds are normal. He exhibits distension. There is no tenderness.  Musculoskeletal:   Left ankle: He exhibits swelling.  Lymphadenopathy:    He has no cervical adenopathy.  Neurological: He is alert and oriented to person, place, and time. No cranial nerve deficit.  Skin: Skin is warm. No rash noted. Nails show no clubbing.  Psychiatric: He has a normal mood and affect.      Data Reviewed: Basic Metabolic Panel: Recent Labs  Lab 07/30/17 0402  07/30/17 1746 07/31/17 0412 07/31/17 1832 08/01/17 0357 08/01/17 1754 08/02/17 0154  NA 133*  --  133* 136  --  134*  --  135  K 2.9*   < > 4.1 3.7 4.2 3.9 4.3 4.0  CL 101  --  99* 105  --  102  --  102  CO2 26  --  26 28  --  27  --  26  GLUCOSE 162*  --  335* 170*  --  188*  --  207*  BUN 24*  --  24* 22*  --  24*  --  26*  CREATININE 1.36*  --  1.36* 1.40*  --  1.32*  --  1.74*  CALCIUM 7.8*  --  8.1* 8.1*  --  8.2*  --  7.9*  MG  --    < > 2.0 1.8 1.9 1.9 1.9 1.8  PHOS  --   --   --  3.1  --  3.4  --   --    < > = values in this interval not displayed.   Liver Function Tests: Recent Labs  Lab  07/29/17 0922  AST 47*  ALT 21  ALKPHOS 138*  BILITOT 1.1  PROT 5.1*  ALBUMIN 1.3*   CBC: Recent Labs  Lab 07/29/17 0922 07/30/17 0402 08/01/17 0357  WBC 5.9 5.5 5.5  HGB 9.0* 7.8* 7.2*  HCT 27.9* 24.1* 21.7*  MCV 81.2 80.4 79.7*  PLT 374 305 285   Cardiac Enzymes: Recent Labs  Lab 07/29/17 0922 08/01/17 1355 08/01/17 1754 08/02/17 0154  TROPONINI 0.05* 0.04* 0.04* 0.06*   BNP (last 3 results) Recent Labs    06/11/17 0518 07/22/17 1640 07/29/17 0955  BNP 45.0 149.0* 93.0     CBG: Recent Labs  Lab 08/01/17 1250 08/01/17 1648 08/01/17 2124 08/02/17 0735 08/02/17 1137  GLUCAP 204* 205* 215* 145* 106*    Scheduled Meds: . amLODipine  5 mg Oral Daily  . aspirin EC  81 mg Oral Daily  . atorvastatin  40 mg Oral q1800  . carvedilol  6.25 mg Oral BID WC  . famotidine  20 mg Oral BID  . FLUoxetine  40 mg Oral Daily  . hydrALAZINE  50 mg Oral Q8H  . insulin aspart  0-5 Units Subcutaneous  QHS  . insulin aspart  0-9 Units Subcutaneous TID WC  . insulin aspart  10 Units Intravenous Once  . insulin aspart  5 Units Subcutaneous TID WC  . insulin detemir  30 Units Subcutaneous QHS  . magnesium oxide  400 mg Oral BID  . potassium chloride  20 mEq Oral BID   Continuous Infusions: . albumin human 25 g (08/02/17 1103)  . piperacillin-tazobactam (ZOSYN)  IV Stopped (08/02/17 1346)  . vancomycin 750 mg (08/02/17 1504)    Assessment/Plan:   # Anasarca due to nephrotic syndrome and proteinuria.   patient Lasix drip currently on hold as renal function has gotten worse after CT angiogram was done yesterday       Daily weight monitoring and nephrology is following.    # Sepsis secondary to multilobar pneumonia  continue Zosyn and vancomycin.  Flu test is negative. Blood cultures and urine cultures are pending Lactic acid in the normal range CT angiogram is negative for pulmonary embolism  # Type 2 diabetes mellitus .  Restarted on his usual insulin and sugars much better.  Patient states that he has been compliant but his hemoglobin A1c is very elevated.  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  Continue hydralazine and titrate as needed  # Hyperlipidemia unspecified on atorvastatin  # Depression on fluoxetine  # Anemia of chronic disease.  Continue to monitor hemoglobin. Code Status:     Code Status Orders  (From admission, onward)        Start     Ordered   07/29/17 1356  Full code  Continuous     07/29/17 1355    Code Status History    Date Active Date Inactive Code Status Order ID Comments User Context   06/09/2017 15:22 06/12/2017 17:22 Full Code [id number]  Ramonita Lab, MD Inpatient   04/14/2017 14:05 04/18/2017 20:05 Full Code [id number]  Shaune Pollack,  MD Inpatient   09/09/2016 19:55 09/11/2016 18:59 Full Code [id number]  Altamese Dilling, MD Inpatient   06/01/2016 19:26 06/02/2016 18:35 Full Code [id number]  Ramonita Lab, MD Inpatient     Disposition Plan: To  be determined  Consultants:  Nephrology  Time spent: 32 minutes  Deanna ArtisAruna Kenshin Splawn  [employer]

## 2017-08-02 NOTE — Progress Notes (Signed)
ELECTROLYTE CONSULT NOTE   Pharmacy Consult for electrolyte monitoring and replacement Indication: hypokalemia on furosemide drip  Allergies  Allergen Reactions  . Lisinopril Swelling   Patient Measurements: Height: 5\' 6"  (167.6 cm) Weight: 240 lb (108.9 kg) IBW/kg (Calculated) : 63.8  Vital Signs: Temp: 99.6 F (37.6 C) (01/26 1654) Temp Source: Oral (01/26 1654) BP: 128/83 (01/26 1322) Pulse Rate: 96 (01/26 1322) Intake/Output from previous day: 01/25 0701 - 01/26 0700 In: 480 [P.O.:480] Out: 2350 [Urine:2350] Intake/Output from this shift: No intake/output data recorded.  Labs: Recent Labs    07/31/17 0412  08/01/17 0357 08/01/17 1754 08/02/17 0154 08/02/17 1805  WBC  --   --  5.5  --   --   --   HGB  --   --  7.2*  --   --   --   HCT  --   --  21.7*  --   --   --   PLT  --   --  285  --   --   --   CREATININE 1.40*  --  1.32*  --  1.74*  --   MG 1.8   < > 1.9 1.9 1.8 2.1  PHOS 3.1  --  3.4  --   --   --    < > = values in this interval not displayed.   Estimated Creatinine Clearance: 69.9 mL/min (A) (by C-G formula based on SCr of 1.74 mg/dL (H)).  Assessment: Pharmacy consulted to assist with electrolyte monitoring and replacement while patient remains on furosemide continuous infusion.   Furosemide drip is running at 4 mg/hr  Per discussion with MD, will monitor electrolytes at least twice daily while on furosemide drip.  Goal of Therapy: Electrolytes WNL  Plan:  K = 4.0, Mg = 1.8 Electrolytes are WNL  - patient has orders for magnesium oxide 400 mg PO BID and KCl 20 mEq PO BID.  No additional supplementation needed at this time. Will recheck K/Mg at 1800 and in the AM  01/26 1805 K 4.1, Mg 2.1. No additional supplement warranted at this time. Will recheck electrolytes with AM labs.   Carola FrostNathan A Miami Latulippe, PharmD, BCPS Clinical Pharmacist 08/02/2017,7:39 PM

## 2017-08-02 NOTE — Progress Notes (Signed)
ANTICOAGULATION CONSULT NOTE - Initial Consult  Pharmacy Consult for heparin infusion Indication: r/o PE  Allergies  Allergen Reactions  . Lisinopril Swelling    Patient Measurements: Height: 5\' 6"  (167.6 cm) Weight: 240 lb (108.9 kg) IBW/kg (Calculated) : 63.8 Heparin Dosing Weight: 88.5 kg  Vital Signs: Temp: 98.1 F (36.7 C) (01/26 1218) Temp Source: Oral (01/26 1218) BP: 139/77 (01/26 0840) Pulse Rate: 105 (01/26 0840)  Labs: Recent Labs    07/31/17 0412 08/01/17 0357 08/01/17 1355 08/01/17 1754 08/02/17 0154  HGB  --  7.2*  --   --   --   HCT  --  21.7*  --   --   --   PLT  --  285  --   --   --   CREATININE 1.40* 1.32*  --   --  1.74*  TROPONINI  --   --  0.04* 0.04* 0.06*    Estimated Creatinine Clearance: 69.9 mL/min (A) (by C-G formula based on SCr of 1.74 mg/dL (H)).   Medical History: Past Medical History:  Diagnosis Date  . Abscess of groin, right 09/09/2016  . 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 Acute gastroenteritis 09/11/2016  . AKI (acute kidney injury) (HCC) 02/19/2013  . Anemia 12/14/2015  . Dehydration 09/11/2016  . Diabetes mellitus without complication (HCC)   . Diabetic acidosis without coma (HCC) 06/01/2016  . Elevated bilirubin 09/11/2016  . Essential hypertension with goal blood pressure less than 130/80 02/07/2015  . Hypercholesterolemia 02/25/2013  . Hyponatremia 09/11/2016  . Hypophosphatemia 12/14/2015  . Hypovitaminosis D 12/16/2015  . Mass of right inguinal region   . Microcytic anemia 11/17/2014  . Non-ST elevation myocardial infarction (NSTEMI) (HCC) 02/19/2013  . Poorly controlled type 1 diabetes mellitus (HCC) 03/03/2012   Overview:  Diagnosed 02/2012.  Now insulin-dependent  . Proteinuria 12/14/2015    Medications:  Infusions:  . albumin human 25 g (08/02/17 1103)  . heparin    . piperacillin-tazobactam (ZOSYN)  IV 3.375 g (08/02/17 1106)  . vancomycin 750 mg (08/02/17 0451)    Assessment: 31 yom  with SOB and some chest pain. Anasarca d/t nephrotic syndrome and proteinuria on furosemide infusion, albumin, and electrolyte supplements. Pharmacy consulted to dose UFH for PE workup.   Goal of Therapy:  Heparin level 0.3-0.7 units/ml Monitor platelets by anticoagulation protocol: Yes   Plan:  Check stat aPTT and PT/INR. Discontinue LDUH Give 3000 units bolus x 1 (half bolus due to recent LDUH administration) Start heparin infusion at 1400 units/hr Check anti-Xa level in 6 hours and daily while on heparin Continue to monitor H&H and platelets  Carola FrostNathan A Cintya Daughety, Pharm.D., BCPS Clinical Pharmacist 08/02/2017,12:47 PM

## 2017-08-02 NOTE — Progress Notes (Signed)
[facility]  ROUNDING NOTE   Subjective:  Urine output was 2.3 L over the preceding 24 hours. Patient now febrile with a temperature of 101. Hemoglobin also lower at 7.2.   Objective:  Vital signs in last 24 hours:  Temp:  [99.4 F (37.4 C)-101 F (38.3 C)] 101 F (38.3 C) (01/26 0735) Pulse Rate:  [103-113] 105 (01/26 0840) Resp:  [16-22] 22 (01/26 0417) BP: (139-164)/(70-92) 139/77 (01/26 0840) SpO2:  [90 %-95 %] 90 % (01/26 0417) Weight:  [106.5 kg (234 lb 14.4 oz)-108.9 kg (240 lb)] 108.9 kg (240 lb) (01/26 0417)  Weight change: 0.153 kg (5.4 oz) Filed Weights   08/01/17 0453 08/01/17 1300 08/02/17 0417  Weight: 106.4 kg (234 lb 9 oz) 106.5 kg (234 lb 14.4 oz) 108.9 kg (240 lb)    Intake/Output: I/O last 3 completed shifts: In: 480 [P.O.:480] Out: 4050 [Urine:4050]   Intake/Output this shift:  Total I/O In: 230 [P.O.:230] Out: -   Physical Exam: General: No acute distress  Head: Normocephalic, atraumatic. Moist oral mucosal membranes  Eyes: Anicteric  Neck: Supple, trachea midline  Lungs:  Clear to auscultation, normal effort  Heart: S1S2 no rubs  Abdomen:  Soft, nontender, bowel sounds present  Extremities: LLE edema 2+ R AKA  Neurologic: Awake, alert, following commands  Skin: Venous stasis changes LLE       Basic Metabolic Panel: Recent Labs  Lab 07/30/17 0402  07/30/17 1746 07/31/17 0412 07/31/17 1832 08/01/17 0357 08/01/17 1754 08/02/17 0154  NA 133*  --  133* 136  --  134*  --  135  K 2.9*   < > 4.1 3.7 4.2 3.9 4.3 4.0  CL 101  --  99* 105  --  102  --  102  CO2 26  --  26 28  --  27  --  26  GLUCOSE 162*  --  335* 170*  --  188*  --  207*  BUN 24*  --  24* 22*  --  24*  --  26*  CREATININE 1.36*  --  1.36* 1.40*  --  1.32*  --  1.74*  CALCIUM 7.8*  --  8.1* 8.1*  --  8.2*  --  7.9*  MG  --    < > 2.0 1.8 1.9 1.9 1.9 1.8  PHOS  --   --   --  3.1  --  3.4  --   --    < > = values in this interval not displayed.    Liver  Function Tests: Recent Labs  Lab 07/29/17 0922  AST 47*  ALT 21  ALKPHOS 138*  BILITOT 1.1  PROT 5.1*  ALBUMIN 1.3*   No results for input(s): LIPASE, AMYLASE in the last 168 hours. No results for input(s): AMMONIA in the last 168 hours.  CBC: Recent Labs  Lab 07/29/17 0922 07/30/17 0402 08/01/17 0357  WBC 5.9 5.5 5.5  HGB 9.0* 7.8* 7.2*  HCT 27.9* 24.1* 21.7*  MCV 81.2 80.4 79.7*  PLT 374 305 285    Cardiac Enzymes: Recent Labs  Lab 07/29/17 0922 08/01/17 1355 08/01/17 1754 08/02/17 0154  TROPONINI 0.05* 0.04* 0.04* 0.06*    BNP: Invalid input(s): POCBNP  CBG: Recent Labs  Lab 08/01/17 1135 08/01/17 1250 08/01/17 1648 08/01/17 2124 08/02/17 0735  GLUCAP 191* 204* 205* 215* 145*    Microbiology: Results for orders placed or performed during the hospital encounter of 07/29/17  CULTURE, BLOOD (ROUTINE X 2) w Reflex to  ID Panel     Status: None (Preliminary result)   Collection Time: 08/01/17  5:54 PM  Result Value Ref Range Status   Specimen Description BLOOD LEFT ANTECUBITAL  Final   Special Requests   Final    BOTTLES DRAWN AEROBIC AND ANAEROBIC Blood Culture results may not be optimal due to an inadequate volume of blood received in culture bottles   Culture   Final    NO GROWTH < 12 HOURS Performed at [facility], [address] [place], [place] [address]    Report Status PENDING  Incomplete  CULTURE, BLOOD (ROUTINE X 2) w Reflex to ID Panel     Status: None (Preliminary result)   Collection Time: 08/01/17  6:03 PM  Result Value Ref Range Status   Specimen Description BLOOD BLOOD RIGHT HAND  Final   Special Requests   Final    BOTTLES DRAWN AEROBIC AND ANAEROBIC Blood Culture adequate volume   Culture   Final    NO GROWTH < 12 HOURS Performed at [facility], [address] [place],  [address]    Report Status PENDING  Incomplete    Coagulation Studies: No results for input(s): LABPROT, INR in the last 72  hours.  Urinalysis: No results for input(s): COLORURINE, LABSPEC, PHURINE, GLUCOSEU, HGBUR, BILIRUBINUR, KETONESUR, PROTEINUR, UROBILINOGEN, NITRITE, LEUKOCYTESUR in the last 72 hours.  Invalid input(s): APPERANCEUR    Imaging: Dg Chest 2 View  Result Date: 08/01/2017 CLINICAL DATA:  Shortness of breath. EXAM: CHEST  2 VIEW COMPARISON:  07/29/2017 FINDINGS: Interval development of large volume airspace disease posterior right lower lobe. Left lung clear. Cardiopericardial silhouette accentuated by the low lung volumes. IMPRESSION: Relatively rapid development of large volume posterior right lower lobe airspace opacity. Features may be related to pneumonia. Given rapid onset, infarct from pulmonary embolus also a consideration CTA chest could be used to further evaluate as clinically warranted. These results will be called to the ordering clinician or representative by the Radiologist Assistant, and communication documented in the PACS or zVision Dashboard. Electronically Signed   By: Misty Stanley M.D.   On: 08/01/2017 18:41   Ct Angio Chest Pe W Or Wo Contrast  Result Date: 08/01/2017 CLINICAL DATA:  Midsternal chest pain. EXAM: CT ANGIOGRAPHY CHEST WITH CONTRAST TECHNIQUE: Multidetector CT imaging of the chest was performed using the standard protocol during bolus administration of intravenous contrast. Multiplanar CT image reconstructions and MIPs were obtained to evaluate the vascular anatomy. CONTRAST:  161mL ISOVUE-370 IOPAMIDOL (ISOVUE-370) INJECTION 76% COMPARISON:  Chest x-ray from earlier today. FINDINGS: Cardiovascular: Heart size normal. No pericardial effusion. No thoracic aortic aneurysm. No filling defect within the opacified pulmonary arteries to suggest the presence of an acute pulmonary embolus. Mediastinum/Nodes: No mediastinal lymphadenopathy. There is no hilar lymphadenopathy. Esophagus is patulous and filled with fluid/debris, possibly from reflux or dysmotility. Upper normal  lymph nodes are seen in the axillary regions bilaterally. Lungs/Pleura: Patchy airspace disease is seen in the posterior right middle lobe, diffusely in the right lower lobe, and posteriorly in the left lower lobe. Small bilateral pleural effusions are evident. Upper Abdomen: Unremarkable. Musculoskeletal: Diffuse body wall edema is apparent. Bone windows reveal no worrisome lytic or sclerotic osseous lesions. Review of the MIP images confirms the above findings. IMPRESSION: 1. No CT evidence for acute pulmonary embolus. 2. Diffuse airspace disease in the right lower lobe is associated with patchy airspace opacity in the posterior right middle lobe and posterior left lower lobe. Imaging features compatible with  multifocal pneumonia. 3. Small bilateral pleural effusions. 4. Diffuse body wall edema. Electronically Signed   By: Misty Stanley M.D.   On: 08/01/2017 19:39     Medications:   . albumin human    . furosemide (LASIX) infusion 4 mg/hr (08/01/17 1054)  . piperacillin-tazobactam (ZOSYN)  IV Stopped (08/02/17 0239)  . vancomycin 750 mg (08/02/17 0451)   . amLODipine  5 mg Oral Daily  . aspirin EC  81 mg Oral Daily  . atorvastatin  40 mg Oral q1800  . carvedilol  6.25 mg Oral BID WC  . famotidine  20 mg Oral BID  . FLUoxetine  40 mg Oral Daily  . heparin  5,000 Units Subcutaneous Q8H  . hydrALAZINE  50 mg Oral Q8H  . insulin aspart  0-5 Units Subcutaneous QHS  . insulin aspart  0-9 Units Subcutaneous TID WC  . insulin aspart  10 Units Intravenous Once  . insulin aspart  5 Units Subcutaneous TID WC  . insulin detemir  30 Units Subcutaneous QHS  . magnesium oxide  400 mg Oral BID  . potassium chloride  20 mEq Oral BID   acetaminophen, alum & mag hydroxide-simeth, docusate sodium  Assessment/ Plan:  32 y.o. male with a PMHx of Diabetes mellitus type 1, hypertension, prior episodes of DKA, history of non-ST elevation myocardial infarction, peripheral vascular disease with right  above-the-knee amputation, hyperlipidemiawho was [facility] evaluation of severe anasarca.   1. Generalized edema due to nephrotic syndrome. 2. Nephrotic syndrome/proteinuria likely due to Diabetes Mellitus.  Negative GBM/HIV/ANCA/GBM/hepatitis screen/ANA/SPEP/UPEP 3. ARF/CKD stage II, ARF due to contrast exposure on 08/01/17. 4. Diabetes mellitus type I with CKD.  5. Hypertension. 6.  Angioedema secondary to lisinopril.  7.  Multifocal right sided pneumonia.   Plan:  Patient was exposed to contrast yesterday.  His renal function is now worse.  He remains febrile.  He has a right sided pneumonia.  We will hold Lasix drip for now as renal function has deteriorated.  Continue vancomycin and Zosyn at this time.   LOS: 4 Chalice Philbert 1/26/201910:35 AM

## 2017-08-03 ENCOUNTER — Inpatient Hospital Stay: Payer: Medicare Other

## 2017-08-03 LAB — COMPREHENSIVE METABOLIC PANEL
ALK PHOS: 80 U/L (ref 38–126)
ALT: 43 U/L (ref 17–63)
ANION GAP: 7 (ref 5–15)
AST: 65 U/L — ABNORMAL HIGH (ref 15–41)
Albumin: 2.2 g/dL — ABNORMAL LOW (ref 3.5–5.0)
BILIRUBIN TOTAL: 0.6 mg/dL (ref 0.3–1.2)
BUN: 29 mg/dL — ABNORMAL HIGH (ref 6–20)
CALCIUM: 7.6 mg/dL — AB (ref 8.9–10.3)
CO2: 25 mmol/L (ref 22–32)
Chloride: 102 mmol/L (ref 101–111)
Creatinine, Ser: 2.4 mg/dL — ABNORMAL HIGH (ref 0.61–1.24)
GFR, EST AFRICAN AMERICAN: 39 mL/min — AB (ref >60)
GFR, EST NON AFRICAN AMERICAN: 34 mL/min — AB (ref >60)
Glucose, Bld: 204 mg/dL — ABNORMAL HIGH (ref 65–99)
Potassium: 3.8 mmol/L (ref 3.5–5.1)
SODIUM: 134 mmol/L — AB (ref 135–145)
TOTAL PROTEIN: 5.3 g/dL — AB (ref 6.5–8.1)

## 2017-08-03 LAB — BASIC METABOLIC PANEL
ANION GAP: 7 (ref 5–15)
BUN: 28 mg/dL — ABNORMAL HIGH (ref 6–20)
CALCIUM: 8.1 mg/dL — AB (ref 8.9–10.3)
CO2: 26 mmol/L (ref 22–32)
Chloride: 102 mmol/L (ref 101–111)
Creatinine, Ser: 2.04 mg/dL — ABNORMAL HIGH (ref 0.61–1.24)
GFR calc Af Amer: 48 mL/min — ABNORMAL LOW (ref >60)
GFR calc non Af Amer: 41 mL/min — ABNORMAL LOW (ref >60)
GLUCOSE: 97 mg/dL (ref 65–99)
Potassium: 3.7 mmol/L (ref 3.5–5.1)
Sodium: 135 mmol/L (ref 135–145)

## 2017-08-03 LAB — TROPONIN I: Troponin I: 0.08 ng/mL (ref <0.03)

## 2017-08-03 LAB — GLUCOSE, CAPILLARY
GLUCOSE-CAPILLARY: 80 mg/dL (ref 65–99)
GLUCOSE-CAPILLARY: 159 mg/dL — AB (ref 65–99)
GLUCOSE-CAPILLARY: 170 mg/dL — AB (ref 65–99)
Glucose-Capillary: 93 mg/dL (ref 65–99)
Glucose-Capillary: 66 mg/dL (ref 65–99)
Glucose-Capillary: 209 mg/dL — ABNORMAL HIGH (ref 65–99)

## 2017-08-03 LAB — MAGNESIUM: Magnesium: 2 mg/dL (ref 1.7–2.4)

## 2017-08-03 LAB — PROCALCITONIN: Procalcitonin: 1.73 ng/mL

## 2017-08-03 LAB — VANCOMYCIN, TROUGH: Vancomycin Tr: 27 ug/mL (ref 15–20)

## 2017-08-03 MED ORDER — FUROSEMIDE 10 MG/ML IJ SOLN
20.0000 mg | Freq: Once | INTRAMUSCULAR | Status: AC
  Administered 2017-08-04: 20 mg via INTRAVENOUS
  Filled 2017-08-03: qty 2

## 2017-08-03 MED ORDER — IPRATROPIUM-ALBUTEROL 0.5-2.5 (3) MG/3ML IN SOLN
3.0000 mL | Freq: Four times a day (QID) | RESPIRATORY_TRACT | Status: DC | PRN
  Administered 2017-08-03: 20:00:00 3 mL via RESPIRATORY_TRACT
  Filled 2017-08-03: qty 3

## 2017-08-03 MED ORDER — LEVALBUTEROL HCL 1.25 MG/0.5ML IN NEBU
1.2500 mg | INHALATION_SOLUTION | Freq: Four times a day (QID) | RESPIRATORY_TRACT | Status: DC | PRN
  Administered 2017-08-04 – 2017-08-07 (×5): 1.25 mg via RESPIRATORY_TRACT
  Filled 2017-08-03 (×6): qty 0.5

## 2017-08-03 MED ORDER — LEVOFLOXACIN IN D5W 750 MG/150ML IV SOLN
750.0000 mg | Freq: Every day | INTRAVENOUS | Status: DC
  Filled 2017-08-03: qty 150

## 2017-08-03 MED ORDER — VANCOMYCIN HCL IN DEXTROSE 1-5 GM/200ML-% IV SOLN
1000.0000 mg | INTRAVENOUS | Status: DC
  Administered 2017-08-04: 1000 mg via INTRAVENOUS
  Filled 2017-08-03 (×2): qty 200

## 2017-08-03 MED ORDER — MORPHINE SULFATE (PF) 2 MG/ML IV SOLN
2.0000 mg | Freq: Four times a day (QID) | INTRAVENOUS | Status: DC | PRN
Start: 2017-08-03 — End: 2017-08-14
  Administered 2017-08-03 – 2017-08-04 (×2): 2 mg via INTRAVENOUS
  Filled 2017-08-03 (×3): qty 1

## 2017-08-03 MED ORDER — ALBUMIN HUMAN 25 % IV SOLN
25.0000 g | Freq: Three times a day (TID) | INTRAVENOUS | Status: DC
  Administered 2017-08-04 – 2017-08-06 (×5): 25 g via INTRAVENOUS
  Filled 2017-08-03 (×8): qty 100

## 2017-08-03 NOTE — Progress Notes (Signed)
Patient complaining of chest pain on left side of chest. Vital signs taken. MD paged. EKG performed.

## 2017-08-03 NOTE — Progress Notes (Signed)
Patient ID: [name], male   DOB: 1986/02/06, 32 y.o.   MRN: [id number]  [employer] PROGRESS NOTE  [name] Gordon:[id number] DOB: 27-May-1986 DOA: 07/29/2017 PCP: Oswaldo Conroy, MD  HPI/Subjective: Patient reports not feeling well today, reporting cough, chest hurts while coughing. chest x-ray has revealed pneumonia  Objective: Vitals:   08/03/17 1941 08/03/17 2030  BP:  (!) 154/76  Pulse: (!) 113 (!) 113  Resp:  20  Temp:  98.7 F (37.1 C)  SpO2: 94% 90%    Filed Weights   08/01/17 1300 08/02/17 0417 08/03/17 0523  Weight: 106.5 kg (234 lb 14.4 oz) 108.9 kg (240 lb) 104.9 kg (231 lb 4.2 oz)    ROS: Review of Systems  Constitutional: Negative for chills and fever.  Eyes: Negative for blurred vision.  Respiratory: Positive for cough and sputum production. Negative for shortness of breath.   Cardiovascular: Negative for chest pain.  Gastrointestinal: Negative for abdominal pain, constipation, diarrhea, nausea and vomiting.  Genitourinary: Negative for dysuria.  Musculoskeletal: Negative for joint pain.  Neurological: Negative for dizziness and headaches.   Exam: Physical Exam  Constitutional: He is oriented to person, place, and time.  HENT:  Nose: No mucosal edema.  Mouth/Throat: No oropharyngeal exudate or posterior oropharyngeal edema.  Eyes: Conjunctivae, EOM and lids are normal. Pupils are equal, round, and reactive to light.  Neck: No JVD present. Carotid bruit is not present. No edema present. No thyroid mass and no thyromegaly present.  Cardiovascular: S1 normal and S2 normal. Exam reveals no gallop.  No murmur heard. Pulses:      Dorsalis pedis pulses are 2+ on the right side, and 2+ on the left side.  Respiratory: No respiratory distress. He has decreased breath sounds in the right lower field and the left lower field. He has no wheezes. He has no rhonchi. He has no rales.  GI: Soft. Bowel sounds are normal. He exhibits distension. There is no  tenderness.  Musculoskeletal:       Left ankle: He exhibits swelling.  Lymphadenopathy:    He has no cervical adenopathy.  Neurological: He is alert and oriented to person, place, and time. No cranial nerve deficit.  Skin: Skin is warm. No rash noted. Nails show no clubbing.  Psychiatric: He has a normal mood and affect.      Data Reviewed: Basic Metabolic Panel: Recent Labs  Lab 07/30/17 1746 07/31/17 0412  08/01/17 0357 08/01/17 1754 08/02/17 0154 08/02/17 1805 08/03/17 0453  NA 133* 136  --  134*  --  135  --  135  K 4.1 3.7   < > 3.9 4.3 4.0 4.1 3.7  CL 99* 105  --  102  --  102  --  102  CO2 26 28  --  27  --  26  --  26  GLUCOSE 335* 170*  --  188*  --  207*  --  97  BUN 24* 22*  --  24*  --  26*  --  28*  CREATININE 1.36* 1.40*  --  1.32*  --  1.74*  --  2.04*  CALCIUM 8.1* 8.1*  --  8.2*  --  7.9*  --  8.1*  MG 2.0 1.8   < > 1.9 1.9 1.8 2.1 2.0  PHOS  --  3.1  --  3.4  --   --   --   --    < > = values in this interval not displayed.   Liver  Function Tests: Recent Labs  Lab 07/29/17 0922  AST 47*  ALT 21  ALKPHOS 138*  BILITOT 1.1  PROT 5.1*  ALBUMIN 1.3*   CBC: Recent Labs  Lab 07/29/17 0922 07/30/17 0402 08/01/17 0357  WBC 5.9 5.5 5.5  HGB 9.0* 7.8* 7.2*  HCT 27.9* 24.1* 21.7*  MCV 81.2 80.4 79.7*  PLT 374 305 285   Cardiac Enzymes: Recent Labs  Lab 07/29/17 0922 08/01/17 1355 08/01/17 1754 08/02/17 0154  TROPONINI 0.05* 0.04* 0.04* 0.06*   BNP (last 3 results) Recent Labs    06/11/17 0518 07/22/17 1640 07/29/17 0955  BNP 45.0 149.0* 93.0     CBG: Recent Labs  Lab 08/03/17 0727 08/03/17 1228 08/03/17 1458 08/03/17 1707 08/03/17 2038  GLUCAP 66 80 159* 170* 209*    Scheduled Meds: . amLODipine  5 mg Oral Daily  . aspirin EC  81 mg Oral Daily  . atorvastatin  40 mg Oral q1800  . carvedilol  6.25 mg Oral BID WC  . famotidine  20 mg Oral BID  . FLUoxetine  40 mg Oral Daily  . hydrALAZINE  50 mg Oral Q8H  . insulin  aspart  0-5 Units Subcutaneous QHS  . insulin aspart  0-9 Units Subcutaneous TID WC  . insulin aspart  5 Units Subcutaneous TID WC  . insulin detemir  30 Units Subcutaneous QHS  . magnesium oxide  400 mg Oral BID   Continuous Infusions: . albumin human 0 g (08/03/17 0637)  . piperacillin-tazobactam (ZOSYN)  IV 3.375 g (08/03/17 1817)  . vancomycin      Assessment/Plan:   # Anasarca due to nephrotic syndrome and proteinuria.   patient Lasix drip currently on hold as renal function has gotten worse after CT angiogram was done yesterday       Daily weight monitoring and nephrology is following.    # Sepsis secondary to multilobar pneumonia  continue Zosyn and vancomycin.  Flu test is negative. Blood cultures are negative and urine cultures ordered but does not seem to be done Lactic acid in the normal range CT angiogram is negative for pulmonary embolism  # Type 2 diabetes mellitus .  Restarted on his usual insulin and sugars much better.  Patient states that he has been compliant but his hemoglobin A1c is very elevated.  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  Continue hydralazine and titrate as needed  # Hyperlipidemia unspecified on atorvastatin  # Depression on fluoxetine  # Anemia of chronic disease.  Continue to monitor hemoglobin. Code Status:     Code Status Orders  (From admission, onward)        Start     Ordered   07/29/17 1356  Full code  Continuous     07/29/17 1355    Code Status History    Date Active Date Inactive Code Status Order ID Comments User Context   06/09/2017 15:22 06/12/2017 17:22 Full Code [id number]  Ramonita LabGouru, Duanne Duchesne, MD Inpatient   04/14/2017 14:05 04/18/2017 20:05 Full Code [id number]  Shaune Pollackhen, Qing, MD Inpatient    09/09/2016 19:55 09/11/2016 18:59 Full Code [id number]  Altamese DillingVachhani, Vaibhavkumar, MD Inpatient   06/01/2016 19:26 06/02/2016 18:35 Full Code [id number]  Ramonita LabGouru, Drew Lips, MD Inpatient     Disposition Plan: To be determined  Consultants:  Nephrology  Time spent: 32 minutes  Deanna Artis Kaesen Rodriguez  [employer]

## 2017-08-03 NOTE — Progress Notes (Signed)
ELECTROLYTE CONSULT NOTE   Pharmacy Consult for electrolyte monitoring and replacement Indication: hypokalemia on furosemide drip  Allergies  Allergen Reactions  . Lisinopril Swelling   Patient Measurements: Height: 5\' 6"  (167.6 cm) Weight: 231 lb 4.2 oz (104.9 kg) IBW/kg (Calculated) : 63.8  Vital Signs: Temp: 99.5 F (37.5 C) (01/27 0523) Temp Source: Oral (01/27 0523) BP: 146/77 (01/27 0523) Pulse Rate: 108 (01/27 0523) Intake/Output from previous day: 01/26 0701 - 01/27 0700 In: 1046 [P.O.:710; I.V.:36; IV Piggyback:300] Out: 3401 [Urine:3400; Blood:1] Intake/Output from this shift: Total I/O In: 120 [P.O.:120] Out: -   Labs: Recent Labs    08/01/17 0357  08/02/17 0154 08/02/17 1805 08/03/17 0453  WBC 5.5  --   --   --   --   HGB 7.2*  --   --   --   --   HCT 21.7*  --   --   --   --   PLT 285  --   --   --   --   CREATININE 1.32*  --  1.74*  --  2.04*  MG 1.9   < > 1.8 2.1 2.0  PHOS 3.4  --   --   --   --    < > = values in this interval not displayed.   Estimated Creatinine Clearance: 58.4 mL/min (A) (by C-G formula based on SCr of 2.04 mg/dL (H)).  Assessment: Pharmacy consulted to assist with electrolyte monitoring and replacement while patient remains on furosemide continuous infusion.   Furosemide drip is running at 4 mg/hr  Per discussion with MD, will monitor electrolytes at least twice daily while on furosemide drip.  Goal of Therapy: Electrolytes WNL  Plan:  K = 4.0, Mg = 1.8 Electrolytes are WNL  - patient has orders for magnesium oxide 400 mg PO BID and KCl 20 mEq PO BID.  No additional supplementation needed at this time. Will recheck K/Mg at 1800 and in the AM  01/26 1805 K 4.1, Mg 2.1. No additional supplement warranted at this time. Will recheck electrolytes with AM labs.   1/27 AM K 3.7, Mag 2.0. Furosemide drip now d/c'd, will check labs daily. Will d/c standing KCl order (got one dose this AM) as SCr is trending up. Will check  labs in AM.  Marty HeckWang, Amandine Covino L, PharmD, BCPS Clinical Pharmacist 08/03/2017,12:26 PM

## 2017-08-03 NOTE — Consult Note (Signed)
Pharmacy Antibiotic Note  [name] is a 32 y.o. male admitted on 07/29/2017 with anasarca, nephrotic syndrome now with fever.  Pharmacy has been consulted for vancomycin/zosyn dosing.  Plan: Zosyn 3.375g IV q8h (4 hour infusion). vancomycin 1500mg  once. Will give next dose in 6 hours for stacked dosing.   Vancomycin 750mg  q 12 hours Due to pt size, risk of accumulation and renal concerns (pt on lasix drip) will dose cautiously Trough prior to the 5th dose   Height: 5\' 6"  (167.6 cm) Weight: 231 lb 4.2 oz (104.9 kg) IBW/kg (Calculated) : 63.8  Temp (24hrs), Avg:99.5 F (37.5 C), Min:99 F (37.2 C), Max:99.8 F (37.7 C)  Recent Labs  Lab 07/29/17 0922 07/30/17 0402 07/30/17 1746 07/31/17 0412 08/01/17 0357 08/02/17 0154 08/02/17 1243 08/02/17 1530 08/03/17 0453 08/03/17 1521  WBC 5.9 5.5  --   --  5.5  --   --   --   --   --   CREATININE 1.66* 1.36* 1.36* 1.40* 1.32* 1.74*  --   --  2.04*  --   LATICACIDVEN  --   --   --   --   --   --  1.2 1.1  --   --   VANCOTROUGH  --   --   --   --   --   --   --   --   --  27*    Estimated Creatinine Clearance: 58.4 mL/min (A) (by C-G formula based on SCr of 2.04 mg/dL (H)).    Allergies  Allergen Reactions  . Lisinopril Swelling    Antimicrobials this admission: vancomycin 1/25 >>  zosyn 1/25 >>   Dose adjustments this admission: Pharmacy trough 27 on vnacomycin 750mg  iv q12h, will change to vancomycin 1gm iv q24h starting at 2300 1/27. Level before 3rd dose on 1/29@2230 .    Microbiology results: 1/25 BCx: NG x 2 days   Thank you for allowing pharmacy to be a part of this patient's care.  Luan PullingGarrett Tameka Hoiland, PharmD, MBA, [facility]BCGP Clinical Pharmacist [facility]     08/03/2017 4:13 PM

## 2017-08-04 LAB — GLUCOSE, CAPILLARY
GLUCOSE-CAPILLARY: 116 mg/dL — AB (ref 65–99)
GLUCOSE-CAPILLARY: 48 mg/dL — AB (ref 65–99)
GLUCOSE-CAPILLARY: 68 mg/dL (ref 65–99)
GLUCOSE-CAPILLARY: 84 mg/dL (ref 65–99)
GLUCOSE-CAPILLARY: 98 mg/dL (ref 65–99)
GLUCOSE-CAPILLARY: 157 mg/dL — AB (ref 65–99)
Glucose-Capillary: 66 mg/dL (ref 65–99)
Glucose-Capillary: 226 mg/dL — ABNORMAL HIGH (ref 65–99)

## 2017-08-04 LAB — PROCALCITONIN: Procalcitonin: 1.56 ng/mL

## 2017-08-04 LAB — MAGNESIUM: Magnesium: 2.2 mg/dL (ref 1.7–2.4)

## 2017-08-04 LAB — BASIC METABOLIC PANEL
ANION GAP: 6 (ref 5–15)
BUN: 29 mg/dL — ABNORMAL HIGH (ref 6–20)
CALCIUM: 7.7 mg/dL — AB (ref 8.9–10.3)
CO2: 26 mmol/L (ref 22–32)
Chloride: 102 mmol/L (ref 101–111)
Creatinine, Ser: 2.53 mg/dL — ABNORMAL HIGH (ref 0.61–1.24)
GFR calc Af Amer: 37 mL/min — ABNORMAL LOW (ref >60)
GFR calc non Af Amer: 32 mL/min — ABNORMAL LOW (ref >60)
GLUCOSE: 70 mg/dL (ref 65–99)
Potassium: 3.6 mmol/L (ref 3.5–5.1)
Sodium: 134 mmol/L — ABNORMAL LOW (ref 135–145)

## 2017-08-04 LAB — MRSA PCR SCREENING: MRSA by PCR: NEGATIVE

## 2017-08-04 LAB — TROPONIN I
Troponin I: 0.07 ng/mL (ref <0.03)
Troponin I: 0.09 ng/mL (ref <0.03)

## 2017-08-04 MED ORDER — DEXTROSE 50 % IV SOLN: 25.0000 mL | Freq: Once | INTRAVENOUS | Status: DC

## 2017-08-04 MED ORDER — METHYLPREDNISOLONE SODIUM SUCC 125 MG IJ SOLR
60.0000 mg | Freq: Three times a day (TID) | INTRAMUSCULAR | Status: DC
  Administered 2017-08-04 – 2017-08-05 (×3): 60 mg via INTRAVENOUS
  Filled 2017-08-04 (×3): qty 2

## 2017-08-04 MED ORDER — DEXTROSE 50 % IV SOLN
INTRAVENOUS | Status: AC
  Administered 2017-08-04: 06:00:00 50 mL via INTRAVENOUS
  Filled 2017-08-04: qty 50

## 2017-08-04 MED ORDER — INSULIN DETEMIR 100 UNIT/ML ~~LOC~~ SOLN
24.0000 [IU] | Freq: Every day | SUBCUTANEOUS | Status: DC
  Administered 2017-08-04 – 2017-08-05 (×2): 24 [IU] via SUBCUTANEOUS
  Filled 2017-08-04 (×3): qty 0.24

## 2017-08-04 MED ORDER — DEXTROSE 50 % IV SOLN
50.0000 mL | Freq: Once | INTRAVENOUS | Status: AC
  Administered 2017-08-04: 50 mL via INTRAVENOUS

## 2017-08-04 MED ORDER — ORAL CARE MOUTH RINSE
15.0000 mL | Freq: Two times a day (BID) | OROMUCOSAL | Status: DC
  Administered 2017-08-04 – 2017-08-14 (×13): 15 mL via OROMUCOSAL

## 2017-08-04 NOTE — Progress Notes (Signed)
Sent text message to [facility] doc regarding afternoon lab of troponin 0.09 and loss of IV access.  IV team notified and pending. Henriette CombsSarah Mercades Bajaj RN

## 2017-08-04 NOTE — Progress Notes (Signed)
Approximately11:00 am pt notified experience chest pain of 5 on scale of 0-10. Pt. presented with mild diaphoretic look and increased RR rate. SpO2 ranging 88-89. Pt. Belched gas when encouraged to take deep breaths. Pt given Morphine and Maalox at 1105 am. With follow up pt. Complain that pain is 8 on scale of 0-10. Pt presenting with exspiriatory wheezes. Respiratory notified and requested presence. MD notified and requested present. Approximatly 11:40 MD responded and assessed pt..  MD ordered lad work discussed etiology and symptom of pneumonia and assessed muscle pain with deep breathing. Respiratory  responded approximately 11:45 provided breathing treatment. Will continue to monitor pt.

## 2017-08-04 NOTE — Progress Notes (Signed)
Inpatient Diabetes Program Recommendations  AACE/ADA: New Consensus Statement on Inpatient Glycemic Control (2015)  Target Ranges:  Prepandial:   less than 140 mg/dL      Peak postprandial:   less than 180 mg/dL (1-2 hours)      Critically ill patients:  140 - 180 mg/dL   Results for [name], [name] (MRN [id number]) as of 08/04/2017 10:53  Ref. Range 08/03/2017 07:27 08/03/2017 12:28 08/03/2017 14:58 08/03/2017 17:07 08/03/2017 20:38 08/04/2017 05:59 08/04/2017 06:23 08/04/2017 07:30 08/04/2017 07:55 08/04/2017 08:21  Glucose-Capillary Latest Ref Range: 65 - 99 mg/dL 66 80 782159 (H) 956170 (H) 213209 (H) 48 (L) 116 (H) 68 66 84   Review of Glycemic Control  Outpatient Diabetes medications: Levemir 30 units QHS, Novolog 10 units TID with meals Current orders for Inpatient glycemic control:Levemir 30 units QHS, Novolog 0-9 units TID with meals, Novolog 0-5 units QHS, Novolog 5 units TID with meals  Inpatient Diabetes Program Recommendations:  Insulin - Basal: Noted fasting glucose down to 48 mg/dl this morning. Please consider decreasing Levemir to 24 units QHS (reduction of 20%). HgbA1C: A1C 14.1% on 06/10/17 which indicated an average glucose 358 mg/dl. A1C has been the same 14.1% since 04/14/2017.  Thanks, [name], RN, MSN, CDE Diabetes Coordinator Inpatient Diabetes Program [phone] (Team Pager from 8am to 5pm)

## 2017-08-04 NOTE — Progress Notes (Signed)
[facility]  ROUNDING NOTE   Subjective:   Afebrile but with subjective chills and fevers.   Complains of nausea and vomiting.   Creatinine 2.53 (2.4)   Objective:  Vital signs in last 24 hours:  Temp:  [97.7 F (36.5 C)-99.3 F (37.4 C)] 99 F (37.2 C) (01/28 1411) Pulse Rate:  [105-118] 115 (01/28 1411) Resp:  [20-29] 26 (01/28 1411) BP: (132-155)/(63-83) 155/75 (01/28 1411) SpO2:  [88 %-100 %] 88 % (01/28 1411) Weight:  [105.1 kg (231 lb 9.6 oz)] 105.1 kg (231 lb 9.6 oz) (01/28 0404)  Weight change: 0.153 kg (5.4 oz) Filed Weights   08/02/17 0417 08/03/17 0523 08/04/17 0404  Weight: 108.9 kg (240 lb) 104.9 kg (231 lb 4.2 oz) 105.1 kg (231 lb 9.6 oz)    Intake/Output: I/O last 3 completed shifts: In: 1300 [P.O.:600; IV Piggyback:700] Out: 1400 [Urine:1400]   Intake/Output this shift:  Total I/O In: 240 [P.O.:240] Out: -   Physical Exam: General: No acute distress  Head: Normocephalic, atraumatic. Moist oral mucosal membranes  Eyes: Anicteric  Neck: Supple, trachea midline  Lungs:  Clear to auscultation, normal effort  Heart: S1S2 no rubs  Abdomen:  Soft, nontender, bowel sounds present  Extremities: No edema, Right AKA  Neurologic: Awake, alert, following commands  Skin: Venous stasis changes LLE       Basic Metabolic Panel: Recent Labs  Lab 07/31/17 0412  08/01/17 0357 08/01/17 1754 08/02/17 0154 08/02/17 1805 08/03/17 0453 08/03/17 2015 08/04/17 0447  NA 136  --  134*  --  135  --  135 134* 134*  K 3.7   < > 3.9 4.3 4.0 4.1 3.7 3.8 3.6  CL 105  --  102  --  102  --  102 102 102  CO2 28  --  27  --  26  --  26 25 26   GLUCOSE 170*  --  188*  --  207*  --  97 204* 70  BUN 22*  --  24*  --  26*  --  28* 29* 29*  CREATININE 1.40*  --  1.32*  --  1.74*  --  2.04* 2.40* 2.53*  CALCIUM 8.1*  --  8.2*  --  7.9*  --  8.1* 7.6* 7.7*  MG 1.8   < > 1.9 1.9 1.8 2.1 2.0  --  2.2  PHOS 3.1  --  3.4  --   --   --   --   --   --    < > =  values in this interval not displayed.    Liver Function Tests: Recent Labs  Lab 07/29/17 0922 08/03/17 2015  AST 47* 65*  ALT 21 43  ALKPHOS 138* 80  BILITOT 1.1 0.6  PROT 5.1* 5.3*  ALBUMIN 1.3* 2.2*   No results for input(s): LIPASE, AMYLASE in the last 168 hours. No results for input(s): AMMONIA in the last 168 hours.  CBC: Recent Labs  Lab 07/29/17 0922 07/30/17 0402 08/01/17 0357  WBC 5.9 5.5 5.5  HGB 9.0* 7.8* 7.2*  HCT 27.9* 24.1* 21.7*  MCV 81.2 80.4 79.7*  PLT 374 305 285    Cardiac Enzymes: Recent Labs  Lab 08/01/17 1355 08/01/17 1754 08/02/17 0154 08/03/17 2015 08/04/17 1157  TROPONINI 0.04* 0.04* 0.06* 0.08* 0.07*    BNP: Invalid input(s): POCBNP  CBG: Recent Labs  Lab 08/04/17 0730 08/04/17 0755 08/04/17 0821 08/04/17 1128 08/04/17 1655  GLUCAP 68 66 84 98 157*  Microbiology: Results for orders placed or performed during the hospital encounter of 07/29/17  CULTURE, BLOOD (ROUTINE X 2) w Reflex to ID Panel     Status: None (Preliminary result)   Collection Time: 08/01/17  5:54 PM  Result Value Ref Range Status   Specimen Description BLOOD LEFT ANTECUBITAL  Final   Special Requests   Final    BOTTLES DRAWN AEROBIC AND ANAEROBIC Blood Culture results may not be optimal due to an inadequate volume of blood received in culture bottles   Culture   Final    NO GROWTH 3 DAYS Performed at [facility], [address] [place], [name] [address]    Report Status PENDING  Incomplete  CULTURE, BLOOD (ROUTINE X 2) w Reflex to ID Panel     Status: None (Preliminary result)   Collection Time: 08/01/17  6:03 PM  Result Value Ref Range Status   Specimen Description BLOOD BLOOD RIGHT HAND  Final   Special Requests   Final    BOTTLES DRAWN AEROBIC AND ANAEROBIC Blood Culture adequate volume   Culture   Final    NO GROWTH 3 DAYS Performed at [facility], [address] [place], [name] [address]    Report Status PENDING   Incomplete    Coagulation Studies: No results for input(s): LABPROT, INR in the last 72 hours.  Urinalysis: No results for input(s): COLORURINE, LABSPEC, PHURINE, GLUCOSEU, HGBUR, BILIRUBINUR, KETONESUR, PROTEINUR, UROBILINOGEN, NITRITE, LEUKOCYTESUR in the last 72 hours.  Invalid input(s): APPERANCEUR    Imaging: Dg Chest Port 1 View  Result Date: 08/03/2017 CLINICAL DATA:  Cough EXAM: PORTABLE CHEST 1 VIEW COMPARISON:  Two days ago FINDINGS: Bilateral airspace disease attributed to pneumonia on recent chest CT. There is progression especially on the left compared to prior. No visible effusion. Normal heart size. No visible Kerley lines or effusion. No pneumothorax. IMPRESSION: Multi lobar pneumonia with progression from 2 days prior. Electronically Signed   By: Marnee SpringJonathon  Watts M.D.   On: 08/03/2017 21:52     Medications:   . albumin human    . piperacillin-tazobactam (ZOSYN)  IV 3.375 g (08/04/17 1132)  . vancomycin Stopped (08/04/17 0113)   . amLODipine  5 mg Oral Daily  . aspirin EC  81 mg Oral Daily  . atorvastatin  40 mg Oral q1800  . carvedilol  6.25 mg Oral BID WC  . famotidine  20 mg Oral BID  . FLUoxetine  40 mg Oral Daily  . hydrALAZINE  50 mg Oral Q8H  . insulin aspart  0-5 Units Subcutaneous QHS  . insulin aspart  0-9 Units Subcutaneous TID WC  . insulin aspart  5 Units Subcutaneous TID WC  . insulin detemir  24 Units Subcutaneous QHS  . magnesium oxide  400 mg Oral BID  . mouth rinse  15 mL Mouth Rinse BID  . methylPREDNISolone (SOLU-MEDROL) injection  60 mg Intravenous TID   acetaminophen, alum & mag hydroxide-simeth, docusate sodium, levalbuterol, morphine injection  Assessment/ Plan:  32 y.o. black male with Diabetes mellitus type 1, hypertension, coronary artery disease, peripheral vascular disease status post right above-the-knee amputation, hyperlipidemiawho was [facility]19for evaluation of severe anasarca.   1. Acute Renal Failure  with hyponatremia and hypokalemia: with normal range creatinine, chronic kidney disease stage II with nephrotic syndrome, proteinuria secondary to diabetes mellitus type I.  Negative serologic work up.  - Acute renal failure from contrast exposure. Creatinine continues to trend up. No acute indication for dialysis.  2. Hypertension: with anasarca/edema: off furosemide gtt. History of angioedema with lisinopril.  - furosemide 20mg  IV x 1 give today.  - IV albumin  - amlodipine, hydralazine, and carvedilol  3. Diabetes mellitus type I with chronic kidney disease: poor control. Hemoglobin A1c of 14.1% on 06/10/17.  - Continue glucose control.   4. Pneumonia: empiric antibiotics. Afebrile.   5. Anemia with kidney failure: hemoglobin 7.2 - microcytic.    LOS: 6 Nimra Puccinelli 1/28/20195:01 PM

## 2017-08-04 NOTE — Progress Notes (Signed)
ELECTROLYTE CONSULT NOTE   Pharmacy Consult for electrolyte monitoring and replacement  Allergies  Allergen Reactions  . Lisinopril Swelling   Patient Measurements: Height: 5\' 6"  (167.6 cm) Weight: 231 lb 9.6 oz (105.1 kg) IBW/kg (Calculated) : 63.8  Vital Signs: Temp: 98.2 F (36.8 C) (01/28 0600) Temp Source: Oral (01/28 0600) BP: 138/66 (01/28 0600) Pulse Rate: 105 (01/28 0600) Intake/Output from previous day: 01/27 0701 - 01/28 0700 In: 1300 [P.O.:600; IV Piggyback:700] Out: -  Intake/Output from this shift: No intake/output data recorded.  Labs: Recent Labs    08/02/17 1805 08/03/17 0453 08/03/17 2015 08/04/17 0447  CREATININE  --  2.04* 2.40* 2.53*  MG 2.1 2.0  --  2.2  ALBUMIN  --   --  2.2*  --   PROT  --   --  5.3*  --   AST  --   --  65*  --   ALT  --   --  43  --   ALKPHOS  --   --  80  --   BILITOT  --   --  0.6  --    Estimated Creatinine Clearance: 47.2 mL/min (A) (by C-G formula based on SCr of 2.53 mg/dL (H)).  Assessment: Pharmacy consulted to assist with electrolyte monitoring and replacement while patient remains on furosemide continuous infusion.   Furosemide drip is running at 4 mg/hr  Per discussion with MD, will monitor electrolytes at least twice daily while on furosemide drip.  Goal of Therapy: Electrolytes WNL  Plan:  1/28 Electrolytes WNL. No replacement needed.   Gardner CandleSheema M Meshawn Oconnor, PharmD, BCPS Clinical Pharmacist 08/04/2017,7:33 AM

## 2017-08-04 NOTE — Progress Notes (Signed)
Hypoglycemic Event  CBG:48 at 0559  Treatment: Dextrose 50% 50ml Orange Juice - 120ml  Symptoms: Patient had a CBG of 48 and was diaphoretic  Follow-up CBG: 0623 CBG Result:116  Possible Reasons for Event: Patient's long acting insulin (Levemir) may need to be adjusted  Comments/MD notified:Michael Elias ElseS Diamond, MD    Orvan SeenYakana D Kimara Bencomo

## 2017-08-04 NOTE — Progress Notes (Signed)
Patient ID: [name], male   DOB: 12/26/1985, 32 y.o.   MRN: [id number]  [employer] PROGRESS NOTE  [name] MVH:[id number] DOB: May 19, 1986 DOA: 07/29/2017 PCP: Oswaldo Conroy, MD  HPI/Subjective: Patient reports cough, chest hurts while coughing. chest x-ray has revealed pneumonia  Objective: Vitals:   08/04/17 1239 08/04/17 1411  BP: (!) 147/83 (!) 155/75  Pulse: (!) 118 (!) 115  Resp: 20 (!) 26  Temp: 99 F (37.2 C) 99 F (37.2 C)  SpO2: 92% (!) 88%    Filed Weights   08/02/17 0417 08/03/17 0523 08/04/17 0404  Weight: 108.9 kg (240 lb) 104.9 kg (231 lb 4.2 oz) 105.1 kg (231 lb 9.6 oz)    ROS: Review of Systems  Constitutional: Negative for chills and fever.  Eyes: Negative for blurred vision.  Respiratory: Positive for cough and sputum production. Negative for shortness of breath.   Cardiovascular: Negative for chest pain.  Gastrointestinal: Negative for abdominal pain, constipation, diarrhea, nausea and vomiting.  Genitourinary: Negative for dysuria.  Musculoskeletal: Negative for joint pain.  Neurological: Negative for dizziness and headaches.   Exam: Physical Exam  Constitutional: He is oriented to person, place, and time.  HENT:  Nose: No mucosal edema.  Mouth/Throat: No oropharyngeal exudate or posterior oropharyngeal edema.  Eyes: Conjunctivae, EOM and lids are normal. Pupils are equal, round, and reactive to light.  Neck: No JVD present. Carotid bruit is not present. No edema present. No thyroid mass and no thyromegaly present.  Cardiovascular: S1 normal and S2 normal. Exam reveals no gallop.  No murmur heard. Pulses:      Dorsalis pedis pulses are 2+ on the right side, and 2+ on the left side.  Respiratory: No respiratory distress. He has decreased breath sounds in the right lower field and the left lower field. He has no wheezes. He has no rhonchi. He has no rales.  GI: Soft. Bowel sounds are normal. He exhibits distension. There is no  tenderness.  Musculoskeletal:       Left ankle: He exhibits swelling.  Lymphadenopathy:    He has no cervical adenopathy.  Neurological: He is alert and oriented to person, place, and time. No cranial nerve deficit.  Skin: Skin is warm. No rash noted. Nails show no clubbing.  Psychiatric: He has a normal mood and affect.      Data Reviewed: Basic Metabolic Panel: Recent Labs  Lab 07/31/17 0412  08/01/17 0357 08/01/17 1754 08/02/17 0154 08/02/17 1805 08/03/17 0453 08/03/17 2015 08/04/17 0447  NA 136  --  134*  --  135  --  135 134* 134*  K 3.7   < > 3.9 4.3 4.0 4.1 3.7 3.8 3.6  CL 105  --  102  --  102  --  102 102 102  CO2 28  --  27  --  26  --  26 25 26   GLUCOSE 170*  --  188*  --  207*  --  97 204* 70  BUN 22*  --  24*  --  26*  --  28* 29* 29*  CREATININE 1.40*  --  1.32*  --  1.74*  --  2.04* 2.40* 2.53*  CALCIUM 8.1*  --  8.2*  --  7.9*  --  8.1* 7.6* 7.7*  MG 1.8   < > 1.9 1.9 1.8 2.1 2.0  --  2.2  PHOS 3.1  --  3.4  --   --   --   --   --   --    < > =  values in this interval not displayed.   Liver Function Tests: Recent Labs  Lab 07/29/17 0922 08/03/17 2015  AST 47* 65*  ALT 21 43  ALKPHOS 138* 80  BILITOT 1.1 0.6  PROT 5.1* 5.3*  ALBUMIN 1.3* 2.2*   CBC: Recent Labs  Lab 07/29/17 0922 07/30/17 0402 08/01/17 0357  WBC 5.9 5.5 5.5  HGB 9.0* 7.8* 7.2*  HCT 27.9* 24.1* 21.7*  MCV 81.2 80.4 79.7*  PLT 374 305 285   Cardiac Enzymes: Recent Labs  Lab 08/01/17 1355 08/01/17 1754 08/02/17 0154 08/03/17 2015 08/04/17 1157  TROPONINI 0.04* 0.04* 0.06* 0.08* 0.07*   BNP (last 3 results) Recent Labs    06/11/17 0518 07/22/17 1640 07/29/17 0955  BNP 45.0 149.0* 93.0     CBG: Recent Labs  Lab 08/04/17 0623 08/04/17 0730 08/04/17 0755 08/04/17 0821 08/04/17 1128  GLUCAP 116* 68 66 84 98    Scheduled Meds: . amLODipine  5 mg Oral Daily  . aspirin EC  81 mg Oral Daily  . atorvastatin  40 mg Oral q1800  . carvedilol  6.25 mg Oral  BID WC  . famotidine  20 mg Oral BID  . FLUoxetine  40 mg Oral Daily  . hydrALAZINE  50 mg Oral Q8H  . insulin aspart  0-5 Units Subcutaneous QHS  . insulin aspart  0-9 Units Subcutaneous TID WC  . insulin aspart  5 Units Subcutaneous TID WC  . insulin detemir  24 Units Subcutaneous QHS  . magnesium oxide  400 mg Oral BID  . mouth rinse  15 mL Mouth Rinse BID  . methylPREDNISolone (SOLU-MEDROL) injection  60 mg Intravenous TID   Continuous Infusions: . albumin human    . piperacillin-tazobactam (ZOSYN)  IV 3.375 g (08/04/17 1132)  . vancomycin Stopped (08/04/17 0113)    Assessment/Plan:   # Anasarca due to nephrotic syndrome and proteinuria.   patient Lasix drip currently on hold as renal function has gotten worse after CT angiogram was done      Daily weight monitoring and nephrology is following.    # Sepsis secondary to multilobar pneumonia with bronchospasm continue Zosyn and vancomycin.  Flu test is negative.  Will discontinue vancomycin if MRSA PCR is negative Blood cultures are negative and urine cultures ordered but does not seem to be done Lactic acid in the normal range CT angiogram is negative for pulmonary embolism Steroid treatments and bronchodilator therapy  # Type 2 diabetes mellitus .  Restarted on his usual insulin and sugars much better.  Patient states that he has been compliant but his hemoglobin A1c is very elevated.  NovoLog 5 units 3 times daily with each meal Lantus dose reduced to 24 units from poor p.o. 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  Continue hydralazine and titrate as needed  # Hyperlipidemia unspecified on atorvastatin  # Depression on fluoxetine  # Anemia of chronic disease.  Continue to monitor hemoglobin. Code Status:     Code  Status Orders  (From admission, onward)        Start     Ordered   07/29/17 1356  Full code  Continuous     07/29/17 1355    Code Status History    Date Active Date Inactive Code Status Order ID Comments User Context   06/09/2017 15:22  06/12/2017 17:22 Full Code [id number]  Ramonita Lab, MD Inpatient   04/14/2017 14:05 04/18/2017 20:05 Full Code [id number]  Shaune Pollack, MD Inpatient   09/09/2016 19:55 09/11/2016 18:59 Full Code [id number]  Altamese Dilling, MD Inpatient   06/01/2016 19:26 06/02/2016 18:35 Full Code [id number]  Ramonita Lab, MD Inpatient     Disposition Plan: To be determined  Consultants:  Nephrology  Time spent: 32 minutes  Deanna Artis Alquan Morrish  [employer]

## 2017-08-04 NOTE — Progress Notes (Signed)
Pt. CBG at 7:30am 68. Pt. Alert oriented X4 skin cool and dry. Pt. Complaint of being cold extra blanket provided. Provided orange juice per protocol. CBG rechecked at 7:56am reading of 66. Provided 2nd serving of  orange juice. Breakfast tray served at 8:04am. Recheck at 8:21 am CBG with in normal range. Will continue to monitor.

## 2017-08-05 ENCOUNTER — Inpatient Hospital Stay: Payer: Medicare Other

## 2017-08-05 LAB — TROPONIN I
Troponin I: 0.07 ng/mL (ref <0.03)
Troponin I: 0.08 ng/mL (ref <0.03)
Troponin I: 0.09 ng/mL (ref <0.03)

## 2017-08-05 LAB — BASIC METABOLIC PANEL
ANION GAP: 9 (ref 5–15)
BUN: 37 mg/dL — ABNORMAL HIGH (ref 6–20)
CALCIUM: 7.9 mg/dL — AB (ref 8.9–10.3)
CO2: 24 mmol/L (ref 22–32)
Chloride: 100 mmol/L — ABNORMAL LOW (ref 101–111)
Creatinine, Ser: 3.01 mg/dL — ABNORMAL HIGH (ref 0.61–1.24)
GFR calc non Af Amer: 26 mL/min — ABNORMAL LOW (ref >60)
GFR, EST AFRICAN AMERICAN: 30 mL/min — AB (ref >60)
GLUCOSE: 180 mg/dL — AB (ref 65–99)
Potassium: 4.2 mmol/L (ref 3.5–5.1)
Sodium: 133 mmol/L — ABNORMAL LOW (ref 135–145)

## 2017-08-05 LAB — CBC
HCT: 20.6 % — ABNORMAL LOW (ref 40.0–52.0)
Hemoglobin: 6.8 g/dL — ABNORMAL LOW (ref 13.0–18.0)
MCH: 26.4 pg (ref 26.0–34.0)
MCHC: 33.1 g/dL (ref 32.0–36.0)
MCV: 79.9 fL — AB (ref 80.0–100.0)
PLATELETS: 309 10*3/uL (ref 150–440)
RBC: 2.57 MIL/uL — ABNORMAL LOW (ref 4.40–5.90)
RDW: 15.2 % — AB (ref 11.5–14.5)
WBC: 10 10*3/uL (ref 3.8–10.6)

## 2017-08-05 LAB — GLUCOSE, CAPILLARY
GLUCOSE-CAPILLARY: 155 mg/dL — AB (ref 65–99)
GLUCOSE-CAPILLARY: 314 mg/dL — AB (ref 65–99)
Glucose-Capillary: 196 mg/dL — ABNORMAL HIGH (ref 65–99)
Glucose-Capillary: 332 mg/dL — ABNORMAL HIGH (ref 65–99)

## 2017-08-05 LAB — PREPARE RBC (CROSSMATCH)

## 2017-08-05 LAB — ABO/RH: ABO/RH(D): O POS

## 2017-08-05 MED ORDER — AMOXICILLIN-POT CLAVULANATE 875-125 MG PO TABS
1.0000 | ORAL_TABLET | Freq: Two times a day (BID) | ORAL | Status: DC
  Administered 2017-08-06 – 2017-08-07 (×3): 1 via ORAL
  Filled 2017-08-05 (×4): qty 1

## 2017-08-05 MED ORDER — PREDNISONE 20 MG PO TABS
40.0000 mg | ORAL_TABLET | Freq: Every day | ORAL | Status: AC
  Administered 2017-08-07: 40 mg via ORAL
  Filled 2017-08-05: qty 2

## 2017-08-05 MED ORDER — PREDNISONE 10 MG PO TABS: 10.0000 mg | ORAL_TABLET | Freq: Every day | ORAL | Status: DC

## 2017-08-05 MED ORDER — SODIUM CHLORIDE 0.9 % IV SOLN: Freq: Once | INTRAVENOUS | Status: DC

## 2017-08-05 MED ORDER — PREDNISONE 20 MG PO TABS: 20.0000 mg | ORAL_TABLET | Freq: Every day | ORAL | Status: DC

## 2017-08-05 MED ORDER — PREDNISONE 10 MG PO TABS: 30.0000 mg | ORAL_TABLET | Freq: Every day | ORAL | Status: DC

## 2017-08-05 MED ORDER — PREDNISONE 50 MG PO TABS
50.0000 mg | ORAL_TABLET | Freq: Every day | ORAL | Status: AC
  Administered 2017-08-06: 50 mg via ORAL
  Filled 2017-08-05: qty 1

## 2017-08-05 MED ORDER — METHYLPREDNISOLONE SODIUM SUCC 40 MG IJ SOLR
40.0000 mg | Freq: Once | INTRAMUSCULAR | Status: AC
  Administered 2017-08-05: 17:00:00 40 mg via INTRAVENOUS
  Filled 2017-08-05: qty 1

## 2017-08-05 NOTE — Progress Notes (Signed)
Contacted Dr. Anne HahnWillis concerning swelling and warmth in patient's R arm. Informed of most recent troponin. Orders to follow by Dr. Anne HahnWillis.

## 2017-08-05 NOTE — Progress Notes (Signed)
ELECTROLYTE CONSULT NOTE   Pharmacy Consult for electrolyte monitoring and replacement  Allergies  Allergen Reactions  . Lisinopril Swelling   Patient Measurements: Height: 5\' 6"  (167.6 cm) Weight: 236 lb 6.4 oz (107.2 kg) IBW/kg (Calculated) : 63.8  Vital Signs: Temp: 99.1 F (37.3 C) (01/29 0513) Temp Source: Oral (01/29 0513) BP: 149/92 (01/29 0513) Pulse Rate: 102 (01/29 0513) Intake/Output from previous day: 01/28 0701 - 01/29 0700 In: 980 [P.O.:480; IV Piggyback:500] Out: -  Intake/Output from this shift: No intake/output data recorded.  Labs: Recent Labs    08/02/17 1805  08/03/17 0453 08/03/17 2015 08/04/17 0447 08/05/17 0557  WBC  --   --   --   --   --  10.0  HGB  --   --   --   --   --  6.8*  HCT  --   --   --   --   --  20.6*  PLT  --   --   --   --   --  309  CREATININE  --    < > 2.04* 2.40* 2.53* 3.01*  MG 2.1  --  2.0  --  2.2  --   ALBUMIN  --   --   --  2.2*  --   --   PROT  --   --   --  5.3*  --   --   AST  --   --   --  65*  --   --   ALT  --   --   --  43  --   --   ALKPHOS  --   --   --  80  --   --   BILITOT  --   --   --  0.6  --   --    < > = values in this interval not displayed.   Estimated Creatinine Clearance: 40.1 mL/min (A) (by C-G formula based on SCr of 3.01 mg/dL (H)).  Assessment: Pharmacy consulted to assist with electrolyte monitoring and replacement.  Goal of Therapy: Electrolytes WNL  Plan:  1/28 Electrolytes WNL. No replacement needed. Will transition to every 48 hours lab monitoring and continue replace electrolytes as needed.   Gardner CandleSheema M Keri Tavella, PharmD, BCPS Clinical Pharmacist 08/05/2017,9:24 AM

## 2017-08-05 NOTE — Progress Notes (Signed)
[facility]  ROUNDING NOTE   Subjective:   hgb 6.8 -0 PRBC transfusion today  Complains of SOB, cough and chest pain  Creatinine 3 (2.53) (2.4)   Objective:  Vital signs in last 24 hours:  Temp:  [98.2 F (36.8 C)-99.2 F (37.3 C)] 98.2 F (36.8 C) (01/29 1515) Pulse Rate:  [101-109] 107 (01/29 1515) Resp:  [18-20] 20 (01/29 1515) BP: (130-161)/(64-92) 154/69 (01/29 1515) SpO2:  [85 %-98 %] 91 % (01/29 1515) Weight:  [107.2 kg (236 lb 6.4 oz)] 107.2 kg (236 lb 6.4 oz) (01/29 0513)  Weight change: 2.177 kg (4 lb 12.8 oz) Filed Weights   08/03/17 0523 08/04/17 0404 08/05/17 0513  Weight: 104.9 kg (231 lb 4.2 oz) 105.1 kg (231 lb 9.6 oz) 107.2 kg (236 lb 6.4 oz)    Intake/Output: I/O last 3 completed shifts: In: 1180 [P.O.:480; IV Piggyback:700] Out: -    Intake/Output this shift:  Total I/O In: 630 [P.O.:240; Blood:340; IV Piggyback:50] Out: -   Physical Exam: General: No acute distress  Head: Normocephalic, atraumatic. Moist oral mucosal membranes  Eyes: Anicteric  Neck: Supple, trachea midline  Lungs:  Clear to auscultation, normal effort  Heart: S1S2 no rubs  Abdomen:  Soft, nontender, bowel sounds present  Extremities: No edema, Right AKA  Neurologic: Awake, alert, following commands  Skin: Venous stasis changes LLE       Basic Metabolic Panel: Recent Labs  Lab 07/31/17 0412  08/01/17 0357 08/01/17 1754 08/02/17 0154 08/02/17 1805 08/03/17 0453 08/03/17 2015 08/04/17 0447 08/05/17 0557  NA 136  --  134*  --  135  --  135 134* 134* 133*  K 3.7   < > 3.9 4.3 4.0 4.1 3.7 3.8 3.6 4.2  CL 105  --  102  --  102  --  102 102 102 100*  CO2 28  --  27  --  26  --  26 25 26 24   GLUCOSE 170*  --  188*  --  207*  --  97 204* 70 180*  BUN 22*  --  24*  --  26*  --  28* 29* 29* 37*  CREATININE 1.40*  --  1.32*  --  1.74*  --  2.04* 2.40* 2.53* 3.01*  CALCIUM 8.1*  --  8.2*  --  7.9*  --  8.1* 7.6* 7.7* 7.9*  MG 1.8   < > 1.9 1.9 1.8 2.1 2.0   --  2.2  --   PHOS 3.1  --  3.4  --   --   --   --   --   --   --    < > = values in this interval not displayed.    Liver Function Tests: Recent Labs  Lab 08/03/17 2015  AST 65*  ALT 43  ALKPHOS 80  BILITOT 0.6  PROT 5.3*  ALBUMIN 2.2*   No results for input(s): LIPASE, AMYLASE in the last 168 hours. No results for input(s): AMMONIA in the last 168 hours.  CBC: Recent Labs  Lab 07/30/17 0402 08/01/17 0357 08/05/17 0557  WBC 5.5 5.5 10.0  HGB 7.8* 7.2* 6.8*  HCT 24.1* 21.7* 20.6*  MCV 80.4 79.7* 79.9*  PLT 305 285 309    Cardiac Enzymes: Recent Labs  Lab 08/04/17 1157 08/04/17 1758 08/04/17 2333 08/05/17 0559 08/05/17 1200  TROPONINI 0.07* 0.09* 0.07* 0.09* 0.08*    BNP: Invalid input(s): POCBNP  CBG: Recent Labs  Lab 08/04/17 1128 08/04/17 1655 08/04/17  2033 08/05/17 0725 08/05/17 1140  GLUCAP 98 157* 226* 155* 196*    Microbiology: Results for orders placed or performed during the hospital encounter of 07/29/17  CULTURE, BLOOD (ROUTINE X 2) w Reflex to ID Panel     Status: None (Preliminary result)   Collection Time: 08/01/17  5:54 PM  Result Value Ref Range Status   Specimen Description BLOOD LEFT ANTECUBITAL  Final   Special Requests   Final    BOTTLES DRAWN AEROBIC AND ANAEROBIC Blood Culture results may not be optimal due to an inadequate volume of blood received in culture bottles   Culture   Final    NO GROWTH 4 DAYS Performed at [facility], [address], [place], [place] [address]    Report Status PENDING  Incomplete  CULTURE, BLOOD (ROUTINE X 2) w Reflex to ID Panel     Status: None (Preliminary result)   Collection Time: 08/01/17  6:03 PM  Result Value Ref Range Status   Specimen Description BLOOD BLOOD RIGHT HAND  Final   Special Requests   Final    BOTTLES DRAWN AEROBIC AND ANAEROBIC Blood Culture adequate volume   Culture   Final    NO GROWTH 4 DAYS Performed at [facility], [address]  [place], [place] [address]    Report Status PENDING  Incomplete  MRSA PCR Screening     Status: None   Collection Time: 08/04/17  3:48 PM  Result Value Ref Range Status   MRSA by PCR NEGATIVE NEGATIVE Final    Comment:        The GeneXpert MRSA Assay (FDA approved for NASAL specimens only), is one component of a comprehensive MRSA colonization surveillance program. It is not intended to diagnose MRSA infection nor to guide or monitor treatment for MRSA infections. Performed at [facility], [address] [place], [place] [address]     Coagulation Studies: No results for input(s): LABPROT, INR in the last 72 hours.  Urinalysis: No results for input(s): COLORURINE, LABSPEC, PHURINE, GLUCOSEU, HGBUR, BILIRUBINUR, KETONESUR, PROTEINUR, UROBILINOGEN, NITRITE, LEUKOCYTESUR in the last 72 hours.  Invalid input(s): APPERANCEUR    Imaging: US Venous Img Upper Uni Right  Result Date: 08/05/2017 CLINICAL DATA:  Right arm edema. Recent IV within the right arm. Evaluate for right upper extremity DVT. EXAM: RIGHT UPPER EXTREMITY VENOUS DOPPLER ULTRASOUND TECHNIQUE: Gray-scale sonography with graded compression, as well as color Doppler and duplex ultrasound were performed to evaluate the upper extremity deep venous system from the level of the subclavian vein and including the jugular, axillary, basilic, radial, ulnar and upper cephalic vein. Spectral Doppler was utilized to evaluate flow at rest and with distal augmentation maneuvers. COMPARISON:  None. FINDINGS: Contralateral Subclavian Vein: Respiratory phasicity is normal and symmetric with the symptomatic side. No evidence of thrombus. Normal compressibility. Internal Jugular Vein: No evidence of thrombus. Normal compressibility, respiratory phasicity and response to augmentation. Subclavian Vein: No evidence of thrombus. Normal compressibility, respiratory phasicity and response to augmentation. Axillary Vein: No evidence of thrombus.  Normal compressibility, respiratory phasicity and response to augmentation. Cephalic Vein: No evidence of thrombus. Normal compressibility, respiratory phasicity and response to augmentation. Basilic Vein: No evidence of thrombus. Normal compressibility, respiratory phasicity and response to augmentation. Brachial Veins: No evidence of thrombus. Normal compressibility, respiratory phasicity and response to augmentation. Radial Veins: No evidence of thrombus. Normal compressibility, respiratory phasicity and response to augmentation. Ulnar Veins: No evidence of thrombus. Normal compressibility, respiratory phasicity and response to augmentation.  Venous Reflux:  None visualized. Other Findings: There is a moderate amount of subcutaneous edema at the level of the forearm and upper arm. IMPRESSION: 1. No evidence of DVT within the right upper extremity. 2. Moderate amount of right upper extremity subcutaneous edema. Electronically Signed   By: Simonne ComeJohn  Watts M.D.   On: 08/05/2017 09:43   Dg Chest Port 1 View  Result Date: 08/03/2017 CLINICAL DATA:  Cough EXAM: PORTABLE CHEST 1 VIEW COMPARISON:  Two days ago FINDINGS: Bilateral airspace disease attributed to pneumonia on recent chest CT. There is progression especially on the left compared to prior. No visible effusion. Normal heart size. No visible Kerley lines or effusion. No pneumothorax. IMPRESSION: Multi lobar pneumonia with progression from 2 days prior. Electronically Signed   By: Marnee SpringJonathon  Watts M.D.   On: 08/03/2017 21:52     Medications:   . sodium chloride 10 mL/hr at 08/05/17 1445  . albumin human Stopped (08/05/17 1224)  . piperacillin-tazobactam (ZOSYN)  IV Stopped (08/05/17 1445)   . amLODipine  5 mg Oral Daily  . [START ON 08/06/2017] amoxicillin-clavulanate  1 tablet Oral Q12H  . aspirin EC  81 mg Oral Daily  . atorvastatin  40 mg Oral q1800  . carvedilol  6.25 mg Oral BID WC  . famotidine  20 mg Oral BID  . FLUoxetine  40 mg Oral Daily   . hydrALAZINE  50 mg Oral Q8H  . insulin aspart  0-5 Units Subcutaneous QHS  . insulin aspart  0-9 Units Subcutaneous TID WC  . insulin aspart  5 Units Subcutaneous TID WC  . insulin detemir  24 Units Subcutaneous QHS  . magnesium oxide  400 mg Oral BID  . mouth rinse  15 mL Mouth Rinse BID  . methylPREDNISolone (SOLU-MEDROL) injection  40 mg Intravenous Once  . [START ON 08/06/2017] predniSONE  50 mg Oral Q breakfast   Followed by  . [START ON 08/07/2017] predniSONE  40 mg Oral Q breakfast   Followed by  . [START ON 08/08/2017] predniSONE  30 mg Oral Q breakfast   Followed by  . [START ON 08/09/2017] predniSONE  20 mg Oral Q breakfast   Followed by  . [START ON 08/10/2017] predniSONE  10 mg Oral Q breakfast   acetaminophen, alum & mag hydroxide-simeth, docusate sodium, levalbuterol, morphine injection  Assessment/ Plan:  32 y.o. black male with Diabetes mellitus type 1, hypertension, coronary artery disease, peripheral vascular disease status post right above-the-knee amputation, hyperlipidemiawho was [facility] evaluation of severe anasarca.   1. 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  Negative serologic work up.  - Acute renal failure from contrast exposure. Creatinine continues to trend up. No acute indication for dialysis.   2. Hypertension: with anasarca/edema: off furosemide gtt. History of angioedema with lisinopril.  - IV albumin  - amlodipine, hydralazine, and carvedilol  3. Diabetes mellitus type I with chronic kidney disease: poor control. Hemoglobin A1c of 14.1% on 06/10/17.  - Continue glucose control.   4. Pneumonia: empiric antibiotics. Afebrile.   5. Anemia with kidney failure: hemoglobin 6.8 - PRBC transfusion today   LOS: 7 [name] 1/29/20194:38 PM

## 2017-08-05 NOTE — Progress Notes (Signed)
PT Cancellation Note  Patient Details Name: [name] MRN: [id number] DOB: 04/11/1986   Cancelled Treatment:    Reason Eval/Treat Not Completed: Medical issues which prohibited therapy   Chart reviewed.  Hg B 6.8 and trending down.  Troponin also elevated.  Will hold this am and continue as appropriate.   [name] 08/05/2017, 9:32 AM

## 2017-08-05 NOTE — Progress Notes (Signed)
Blood started on pt at 1500. No reaction this far. VSS. Pt tolerating well.

## 2017-08-05 NOTE — Progress Notes (Signed)
Patient ID: [name], male   DOB: 04/09/1986, 32 y.o.   MRN: [id number]  [employer] PROGRESS NOTE  Henry Gala Lewandowskyorain NWG:[id number] DOB: 09/22/1985 DOA: 07/29/2017 PCP: [name], [name], MD  HPI/Subjective: Patient reports chest pain while coughing.  Pain meds are helping with the pain  Objective: Vitals:   08/05/17 0745 08/05/17 1313  BP: (!) 161/84 (!) 153/70  Pulse: (!) 101 (!) 103  Resp: 18 20  Temp: 98.7 F (37.1 C) 98.3 F (36.8 C)  SpO2: 90% (!) 85%    Filed Weights   08/03/17 0523 08/04/17 0404 08/05/17 0513  Weight: 104.9 kg (231 lb 4.2 oz) 105.1 kg (231 lb 9.6 oz) 107.2 kg (236 lb 6.4 oz)    ROS: Review of Systems  Constitutional: Negative for chills and fever.  Eyes: Negative for blurred vision.  Respiratory: Positive for cough and sputum production. Negative for shortness of breath.   Cardiovascular: Negative for chest pain.  Gastrointestinal: Negative for abdominal pain, constipation, diarrhea, nausea and vomiting.  Genitourinary: Negative for dysuria.  Musculoskeletal: Negative for joint pain.  Neurological: Negative for dizziness and headaches.   Exam: Physical Exam  Constitutional: He is oriented to person, place, and time.  HENT:  Nose: No mucosal edema.  Mouth/Throat: No oropharyngeal exudate or posterior oropharyngeal edema.  Eyes: Conjunctivae, EOM and lids are normal. Pupils are equal, round, and reactive to light.  Neck: No JVD present. Carotid bruit is not present. No edema present. No thyroid mass and no thyromegaly present.  Cardiovascular: S1 normal and S2 normal. Exam reveals no gallop.  No murmur heard. Pulses:      Dorsalis pedis pulses are 2+ on the right side, and 2+ on the left side.  Respiratory: No respiratory distress. He has decreased breath sounds in the right lower field and the left lower field. He has no wheezes. He has no rhonchi. He has no rales.  GI: Soft. Bowel sounds are normal. He exhibits distension. There is no  tenderness.  Musculoskeletal:       Left ankle: He exhibits swelling.  Lymphadenopathy:    He has no cervical adenopathy.  Neurological: He is alert and oriented to person, place, and time. No cranial nerve deficit.  Skin: Skin is warm. No rash noted. Nails show no clubbing.  Psychiatric: He has a normal mood and affect.      Data Reviewed: Basic Metabolic Panel: Recent Labs  Lab 07/31/17 0412  08/01/17 0357 08/01/17 1754 08/02/17 0154 08/02/17 1805 08/03/17 0453 08/03/17 2015 08/04/17 0447 08/05/17 0557  NA 136  --  134*  --  135  --  135 134* 134* 133*  K 3.7   < > 3.9 4.3 4.0 4.1 3.7 3.8 3.6 4.2  CL 105  --  102  --  102  --  102 102 102 100*  CO2 28  --  27  --  26  --  26 25 26 24   GLUCOSE 170*  --  188*  --  207*  --  97 204* 70 180*  BUN 22*  --  24*  --  26*  --  28* 29* 29* 37*  CREATININE 1.40*  --  1.32*  --  1.74*  --  2.04* 2.40* 2.53* 3.01*  CALCIUM 8.1*  --  8.2*  --  7.9*  --  8.1* 7.6* 7.7* 7.9*  MG 1.8   < > 1.9 1.9 1.8 2.1 2.0  --  2.2  --   PHOS 3.1  --  3.4  --   --   --   --   --   --   --    < > = values in this interval not displayed.   Liver Function Tests: Recent Labs  Lab 08/03/17 2015  AST 65*  ALT 43  ALKPHOS 80  BILITOT 0.6  PROT 5.3*  ALBUMIN 2.2*   CBC: Recent Labs  Lab 07/30/17 0402 08/01/17 0357 08/05/17 0557  WBC 5.5 5.5 10.0  HGB 7.8* 7.2* 6.8*  HCT 24.1* 21.7* 20.6*  MCV 80.4 79.7* 79.9*  PLT 305 285 309   Cardiac Enzymes: Recent Labs  Lab 08/04/17 1157 08/04/17 1758 08/04/17 2333 08/05/17 0559 08/05/17 1200  TROPONINI 0.07* 0.09* 0.07* 0.09* 0.08*   BNP (last 3 results) Recent Labs    06/11/17 0518 07/22/17 1640 07/29/17 0955  BNP 45.0 149.0* 93.0     CBG: Recent Labs  Lab 08/04/17 1128 08/04/17 1655 08/04/17 2033 08/05/17 0725 08/05/17 1140  GLUCAP 98 157* 226* 155* 196*    Scheduled Meds: . amLODipine  5 mg Oral Daily  . [START ON 08/06/2017] amoxicillin-clavulanate  1 tablet Oral Q12H   . aspirin EC  81 mg Oral Daily  . atorvastatin  40 mg Oral q1800  . carvedilol  6.25 mg Oral BID WC  . famotidine  20 mg Oral BID  . FLUoxetine  40 mg Oral Daily  . hydrALAZINE  50 mg Oral Q8H  . insulin aspart  0-5 Units Subcutaneous QHS  . insulin aspart  0-9 Units Subcutaneous TID WC  . insulin aspart  5 Units Subcutaneous TID WC  . insulin detemir  24 Units Subcutaneous QHS  . magnesium oxide  400 mg Oral BID  . mouth rinse  15 mL Mouth Rinse BID  . methylPREDNISolone (SOLU-MEDROL) injection  40 mg Intravenous Once  . [START ON 08/06/2017] predniSONE  50 mg Oral Q breakfast   Followed by  . [START ON 08/07/2017] predniSONE  40 mg Oral Q breakfast   Followed by  . [START ON 08/08/2017] predniSONE  30 mg Oral Q breakfast   Followed by  . [START ON 08/09/2017] predniSONE  20 mg Oral Q breakfast   Followed by  . [START ON 08/10/2017] predniSONE  10 mg Oral Q breakfast   Continuous Infusions: . albumin human Stopped (08/05/17 1224)  . 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  Vancomycin discontinued Avoid nephrotoxins and  renal dose medications  #Symptomatic anemia from chronic kidney disease Hb- 7.2- 6.8 Transfuse 1 unit of blood  # Anasarca due to nephrotic syndrome and proteinuria.   patient Lasix drip currently on hold as renal function has gotten worse after CT angiogram was done      Daily weight monitoring and nephrology is following.    # Sepsis secondary to multilobar pneumonia with bronchospasm with pleuritic chest pain troponins are non-trending continue Zosyn and vancomycin.  Flu test is negative. discontinue vancomycin , MRSA PCR is negative.  Will change Zosyn to Augmentin  Blood cultures are negative and urine cultures ordered but does not seem to be done Lactic acid in the normal range CT angiogram  is negative for pulmonary embolism Steroid treatments and bronchodilator therapy   # Type 2 diabetes mellitus .  Restarted on his usual insulin and sugars much better.  Patient states that he has  been compliant but his hemoglobin A1c is very elevated.  NovoLog 5 units 3 times daily with each meal Lantus dose reduced to 24 units from poor p.o. intake   # Hypokalemia and hypomagnesemia replace magnesium and potassium orally prn  # Essential hypertension and tachycardia on Norvasc, Coreg added.  Continue hydralazine and titrate as needed  # Hyperlipidemia unspecified on atorvastatin  # Depression on fluoxetine  # Anemia of chronic disease.  Continue to monitor hemoglobin. Code Status:     Code Status Orders  (From admission, onward)        Start     Ordered   07/29/17 1356  Full code  Continuous     07/29/17 1355    Code Status History    Date Active Date Inactive Code Status Order ID Comments User Context   06/09/2017 15:22 06/12/2017 17:22 Full Code [id number]  Ramonita Lab, MD Inpatient   04/14/2017 14:05 04/18/2017 20:05 Full Code [id number]  Shaune Pollack, MD Inpatient   09/09/2016 19:55 09/11/2016 18:59 Full Code [id number]  Altamese Dilling, MD Inpatient   06/01/2016 19:26 06/02/2016 18:35 Full Code [id number]  Ramonita Lab, MD Inpatient     Disposition Plan: To be determined  Consultants:  Nephrology  Time spent: 36 minutes  Plan of care discussed in detail with the patient and his brother [place] over phone . They verbalized understanding of the plan Ramonita Lab  [employer]

## 2017-08-05 NOTE — Progress Notes (Signed)
Pt seems withdrawn and uninterested. Went into pts room to check on him @1400  and asked about his toileting needs. He stated "I was going to ask you about that. I haven't gone all day." When I asked him if he needed to use the urinal he said he could try. I handed him the urinal that was on his bedside table by his bed, and he urinated 700ml. Upon pulling the covers back it appears as though the pt had urinated or had a bowel movement all over himself. When I asked him about it he said it must have happened in his sleep and he just kind of shrugged. Message sent to Dr. Amado CoeGouru asking for a psych consult.

## 2017-08-05 NOTE — Progress Notes (Signed)
Pt complaining indigestion after eating. Maalox given.

## 2017-08-06 DIAGNOSIS — F4323 Adjustment disorder with mixed anxiety and depressed mood: Secondary | ICD-10-CM

## 2017-08-06 LAB — CBC
HEMATOCRIT: 21.4 % — AB (ref 40.0–52.0)
Hemoglobin: 7 g/dL — ABNORMAL LOW (ref 13.0–18.0)
MCH: 26.3 pg (ref 26.0–34.0)
MCHC: 32.8 g/dL (ref 32.0–36.0)
MCV: 80.3 fL (ref 80.0–100.0)
Platelets: 370 10*3/uL (ref 150–440)
RBC: 2.67 MIL/uL — ABNORMAL LOW (ref 4.40–5.90)
RDW: 15.2 % — AB (ref 11.5–14.5)
WBC: 12.8 10*3/uL — AB (ref 3.8–10.6)

## 2017-08-06 LAB — CULTURE, BLOOD (ROUTINE X 2)
Culture: NO GROWTH
Culture: NO GROWTH
SPECIAL REQUESTS: ADEQUATE

## 2017-08-06 LAB — RENAL FUNCTION PANEL
Albumin: 2.4 g/dL — ABNORMAL LOW (ref 3.5–5.0)
Anion gap: 12 (ref 5–15)
BUN: 53 mg/dL — AB (ref 6–20)
CHLORIDE: 98 mmol/L — AB (ref 101–111)
CO2: 22 mmol/L (ref 22–32)
Calcium: 7.9 mg/dL — ABNORMAL LOW (ref 8.9–10.3)
Creatinine, Ser: 3.56 mg/dL — ABNORMAL HIGH (ref 0.61–1.24)
GFR calc Af Amer: 24 mL/min — ABNORMAL LOW (ref >60)
GFR, EST NON AFRICAN AMERICAN: 21 mL/min — AB (ref >60)
GLUCOSE: 215 mg/dL — AB (ref 65–99)
POTASSIUM: 3.7 mmol/L (ref 3.5–5.1)
Phosphorus: 5.4 mg/dL — ABNORMAL HIGH (ref 2.5–4.6)
Sodium: 132 mmol/L — ABNORMAL LOW (ref 135–145)

## 2017-08-06 LAB — PREPARE RBC (CROSSMATCH)

## 2017-08-06 LAB — GLUCOSE, CAPILLARY
GLUCOSE-CAPILLARY: 134 mg/dL — AB (ref 65–99)
GLUCOSE-CAPILLARY: 166 mg/dL — AB (ref 65–99)
GLUCOSE-CAPILLARY: 146 mg/dL — AB (ref 65–99)
Glucose-Capillary: 193 mg/dL — ABNORMAL HIGH (ref 65–99)

## 2017-08-06 MED ORDER — INSULIN DETEMIR 100 UNIT/ML ~~LOC~~ SOLN
25.0000 [IU] | Freq: Every day | SUBCUTANEOUS | Status: DC
  Administered 2017-08-06 – 2017-08-13 (×8): 25 [IU] via SUBCUTANEOUS
  Filled 2017-08-06 (×11): qty 0.25

## 2017-08-06 MED ORDER — INSULIN ASPART 100 UNIT/ML ~~LOC~~ SOLN
7.0000 [IU] | Freq: Three times a day (TID) | SUBCUTANEOUS | Status: DC
  Administered 2017-08-06 – 2017-08-07 (×3): 7 [IU] via SUBCUTANEOUS
  Filled 2017-08-06 (×3): qty 1

## 2017-08-06 MED ORDER — FUROSEMIDE 10 MG/ML IJ SOLN
20.0000 mg | Freq: Once | INTRAMUSCULAR | Status: AC
  Administered 2017-08-06: 11:00:00 20 mg via INTRAVENOUS
  Filled 2017-08-06: qty 2

## 2017-08-06 MED ORDER — FUROSEMIDE 10 MG/ML IJ SOLN
20.0000 mg | Freq: Once | INTRAMUSCULAR | Status: AC
  Administered 2017-08-06: 13:00:00 20 mg via INTRAVENOUS
  Filled 2017-08-06: qty 2

## 2017-08-06 MED ORDER — SODIUM CHLORIDE 0.9 % IV SOLN
Freq: Once | INTRAVENOUS | Status: AC
  Administered 2017-08-06: 20:00:00 via INTRAVENOUS

## 2017-08-06 MED ORDER — CARVEDILOL 12.5 MG PO TABS
12.5000 mg | ORAL_TABLET | Freq: Two times a day (BID) | ORAL | Status: DC
  Administered 2017-08-06 – 2017-08-13 (×14): 12.5 mg via ORAL
  Filled 2017-08-06 (×5): qty 1
  Filled 2017-08-06: qty 4
  Filled 2017-08-06 (×2): qty 1
  Filled 2017-08-06: qty 4
  Filled 2017-08-06 (×5): qty 1

## 2017-08-06 NOTE — Progress Notes (Signed)
PT Cancellation Note  Patient Details Name: [name] MRN: [id number] DOB: 04/29/1986   Cancelled Treatment:    Reason Eval/Treat Not Completed: Medical issues which prohibited therapy. Treatment attempted, noted Hgb only at 7.0 s/p blood transfusion yesterday. Pt with difficulty breathing, currently on 3L of O2, sats at 82%. RN notified and on her way to round on patient and administer meds. Pt also reporting chest pressure; slightly elevated troponin noted. Will hold therapy at this time.   Aly Seidenberg 08/06/2017, 10:59 AM Elizabeth PalauStephanie Zvi Duplantis, PT, DPT [phone]

## 2017-08-06 NOTE — Progress Notes (Signed)
[facility]  ROUNDING NOTE   Subjective:   hgb 7 - PRBC transfusion yesterday   Furosemide today  Creatinine 3.56 (3) (2.53) (2.4)   Objective:  Vital signs in last 24 hours:  Temp:  [97.5 F (36.4 C)-98.7 F (37.1 C)] 98.7 F (37.1 C) (01/30 1947) Pulse Rate:  [99-104] 104 (01/30 1947) Resp:  [20] 20 (01/30 1947) BP: (141-161)/(72-82) 149/72 (01/30 1947) SpO2:  [85 %-97 %] 90 % (01/30 1947) Weight:  [107.6 kg (237 lb 3.4 oz)] 107.6 kg (237 lb 3.4 oz) (01/30 0526)  Weight change: 0.37 kg (13 oz) Filed Weights   08/04/17 0404 08/05/17 0513 08/06/17 0526  Weight: 105.1 kg (231 lb 9.6 oz) 107.2 kg (236 lb 6.4 oz) 107.6 kg (237 lb 3.4 oz)    Intake/Output: I/O last 3 completed shifts: In: 2079.8 [P.O.:1200; I.V.:29.5; Blood:706.3; IV Piggyback:144] Out: 1400 [Urine:1400]   Intake/Output this shift:  No intake/output data recorded.  Physical Exam: General: No acute distress  Head: Normocephalic, atraumatic. Moist oral mucosal membranes  Eyes: Anicteric  Neck: Supple, trachea midline  Lungs:  Clear to auscultation, normal effort  Heart: S1S2 no rubs  Abdomen:  Soft, nontender, bowel sounds present  Extremities: No edema, Right AKA  Neurologic: Awake, alert, following commands  Skin: Venous stasis changes LLE       Basic Metabolic Panel: Recent Labs  Lab 07/31/17 0412  08/01/17 0357 08/01/17 1754 08/02/17 0154 08/02/17 1805 08/03/17 0453 08/03/17 2015 08/04/17 0447 08/05/17 0557 08/06/17 0726  NA 136  --  134*  --  135  --  135 134* 134* 133* 132*  K 3.7   < > 3.9 4.3 4.0 4.1 3.7 3.8 3.6 4.2 3.7  CL 105  --  102  --  102  --  102 102 102 100* 98*  CO2 28  --  27  --  26  --  26 25 26 24 22   GLUCOSE 170*  --  188*  --  207*  --  97 204* 70 180* 215*  BUN 22*  --  24*  --  26*  --  28* 29* 29* 37* 53*  CREATININE 1.40*  --  1.32*  --  1.74*  --  2.04* 2.40* 2.53* 3.01* 3.56*  CALCIUM 8.1*  --  8.2*  --  7.9*  --  8.1* 7.6* 7.7* 7.9* 7.9*   MG 1.8   < > 1.9 1.9 1.8 2.1 2.0  --  2.2  --   --   PHOS 3.1  --  3.4  --   --   --   --   --   --   --  5.4*   < > = values in this interval not displayed.    Liver Function Tests: Recent Labs  Lab 08/03/17 2015 08/06/17 0726  AST 65*  --   ALT 43  --   ALKPHOS 80  --   BILITOT 0.6  --   PROT 5.3*  --   ALBUMIN 2.2* 2.4*   No results for input(s): LIPASE, AMYLASE in the last 168 hours. No results for input(s): AMMONIA in the last 168 hours.  CBC: Recent Labs  Lab 08/01/17 0357 08/05/17 0557 08/06/17 0726  WBC 5.5 10.0 12.8*  HGB 7.2* 6.8* 7.0*  HCT 21.7* 20.6* 21.4*  MCV 79.7* 79.9* 80.3  PLT 285 309 370    Cardiac Enzymes: Recent Labs  Lab 08/04/17 1157 08/04/17 1758 08/04/17 2333 08/05/17 0559 08/05/17 1200  TROPONINI 0.07*  0.09* 0.07* 0.09* 0.08*    BNP: Invalid input(s): POCBNP  CBG: Recent Labs  Lab 08/05/17 1700 08/05/17 2116 08/06/17 0749 08/06/17 1141 08/06/17 1646  GLUCAP 314* 332* 193* 134* 166*    Microbiology: Results for orders placed or performed during the hospital encounter of 07/29/17  CULTURE, BLOOD (ROUTINE X 2) w Reflex to ID Panel     Status: None   Collection Time: 08/01/17  5:54 PM  Result Value Ref Range Status   Specimen Description BLOOD LEFT ANTECUBITAL  Final   Special Requests   Final    BOTTLES DRAWN AEROBIC AND ANAEROBIC Blood Culture results may not be optimal due to an inadequate volume of blood received in culture bottles   Culture   Final    NO GROWTH 5 DAYS Performed at [facility], [address] [place], [name] [address]    Report Status 08/06/2017 FINAL  Final  CULTURE, BLOOD (ROUTINE X 2) w Reflex to ID Panel     Status: None   Collection Time: 08/01/17  6:03 PM  Result Value Ref Range Status   Specimen Description BLOOD BLOOD RIGHT HAND  Final   Special Requests   Final    BOTTLES DRAWN AEROBIC AND ANAEROBIC Blood Culture adequate volume   Culture   Final    NO GROWTH 5  DAYS Performed at [facility], [address] [place], [name] [address]    Report Status 08/06/2017 FINAL  Final  MRSA PCR Screening     Status: None   Collection Time: 08/04/17  3:48 PM  Result Value Ref Range Status   MRSA by PCR NEGATIVE NEGATIVE Final    Comment:        The GeneXpert MRSA Assay (FDA approved for NASAL specimens only), is one component of a comprehensive MRSA colonization surveillance program. It is not intended to diagnose MRSA infection nor to guide or monitor treatment for MRSA infections. Performed at [facility], [address] [place], [name] [address]     Coagulation Studies: No results for input(s): LABPROT, INR in the last 72 hours.  Urinalysis: No results for input(s): COLORURINE, LABSPEC, PHURINE, GLUCOSEU, HGBUR, BILIRUBINUR, KETONESUR, PROTEINUR, UROBILINOGEN, NITRITE, LEUKOCYTESUR in the last 72 hours.  Invalid input(s): APPERANCEUR    Imaging: [place] Venous Img Upper Uni Right  Result Date: 08/05/2017 CLINICAL DATA:  Right arm edema. Recent IV within the right arm. Evaluate for right upper extremity DVT. EXAM: RIGHT UPPER EXTREMITY VENOUS DOPPLER ULTRASOUND TECHNIQUE: Gray-scale sonography with graded compression, as well as color Doppler and duplex ultrasound were performed to evaluate the upper extremity deep venous system from the level of the subclavian vein and including the jugular, axillary, basilic, radial, ulnar and upper cephalic vein. Spectral Doppler was utilized to evaluate flow at rest and with distal augmentation maneuvers. COMPARISON:  None. FINDINGS: Contralateral Subclavian Vein: Respiratory phasicity is normal and symmetric with the symptomatic side. No evidence of thrombus. Normal compressibility. Internal Jugular Vein: No evidence of thrombus. Normal compressibility, respiratory phasicity and response to augmentation. Subclavian Vein: No evidence of thrombus. Normal compressibility, respiratory phasicity  and response to augmentation. Axillary Vein: No evidence of thrombus. Normal compressibility, respiratory phasicity and response to augmentation. Cephalic Vein: No evidence of thrombus. Normal compressibility, respiratory phasicity and response to augmentation. Basilic Vein: No evidence of thrombus. Normal compressibility, respiratory phasicity and response to augmentation. Brachial Veins: No evidence of thrombus. Normal compressibility, respiratory phasicity and response to augmentation. Radial Veins: No evidence of thrombus. Normal compressibility,  respiratory phasicity and response to augmentation. Ulnar Veins: No evidence of thrombus. Normal compressibility, respiratory phasicity and response to augmentation. Venous Reflux:  None visualized. Other Findings: There is a moderate amount of subcutaneous edema at the level of the forearm and upper arm. IMPRESSION: 1. No evidence of DVT within the right upper extremity. 2. Moderate amount of right upper extremity subcutaneous edema. Electronically Signed   By: Simonne Come M.D.   On: 08/05/2017 09:43     Medications:   . sodium chloride Stopped (08/05/17 1730)   . amLODipine  5 mg Oral Daily  . amoxicillin-clavulanate  1 tablet Oral Q12H  . aspirin EC  81 mg Oral Daily  . atorvastatin  40 mg Oral q1800  . carvedilol  12.5 mg Oral BID WC  . famotidine  20 mg Oral BID  . FLUoxetine  40 mg Oral Daily  . hydrALAZINE  50 mg Oral Q8H  . insulin aspart  0-5 Units Subcutaneous QHS  . insulin aspart  0-9 Units Subcutaneous TID WC  . insulin aspart  7 Units Subcutaneous TID WC  . insulin detemir  25 Units Subcutaneous QHS  . magnesium oxide  400 mg Oral BID  . mouth rinse  15 mL Mouth Rinse BID  . [START ON 08/07/2017] predniSONE  40 mg Oral Q breakfast   Followed by  . [START ON 08/08/2017] predniSONE  30 mg Oral Q breakfast   Followed by  . [START ON 08/09/2017] predniSONE  20 mg Oral Q breakfast   Followed by  . [START ON 08/10/2017] predniSONE  10 mg  Oral Q breakfast   acetaminophen, alum & mag hydroxide-simeth, docusate sodium, levalbuterol, morphine injection  Assessment/ Plan:  32 y.o. black male with Diabetes mellitus type 1, hypertension, coronary artery disease, peripheral vascular disease status post right above-the-knee amputation, hyperlipidemiawho was admitted to [facility] on 1/22/19for evaluation of severe anasarca.   1. 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  Negative serologic work up.  - Acute renal failure from contrast exposure. Creatinine continues to trend up. No acute indication for dialysis.  - Continue to monitor closely  2. Hypertension: with anasarca/edema: off furosemide gtt. History of angioedema with lisinopril.  - discontinue IV albumin  - IV furosemide x 1.  - amlodipine, hydralazine, and carvedilol  3. Diabetes mellitus type I with chronic kidney disease: poor control. Hemoglobin A1c of 14.1% on 06/10/17.  - Continue glucose control.   4. Pneumonia: empiric antibiotics. Afebrile.   5. Anemia with kidney failure: hemoglobin 7 - PRBC transfusion yesterday   LOS: 8 [name] 1/30/20198:16 PM

## 2017-08-06 NOTE — Plan of Care (Signed)
VSS, free of falls during shift.  Denies pain, no needs overnight.  [facility]  Bed in low position, call bell within reach.  WCTM.

## 2017-08-06 NOTE — Progress Notes (Signed)
Patient ID: Thane Age, male   DOB: 06-06-1986, 32 y.o.   MRN: [id number]  [employer] PROGRESS NOTE  [name] [name]:[id number] DOB: 1985/10/12 DOA: 07/29/2017 PCP: Oswaldo Conroy, MD  HPI/Subjective: Patient reports being weak and tired pain meds are helping with the chest pain     Objective: Vitals:   08/06/17 1311 08/06/17 1314  BP: (!) 161/82   Pulse: 99   Resp: 20   Temp: (!) 97.5 F (36.4 C)   SpO2: 90% 92%    Filed Weights   08/04/17 0404 08/05/17 0513 08/06/17 0526  Weight: 105.1 kg (231 lb 9.6 oz) 107.2 kg (236 lb 6.4 oz) 107.6 kg (237 lb 3.4 oz)    ROS: Review of Systems  Constitutional: Negative for chills and fever.  Eyes: Negative for blurred vision.  Respiratory: Positive for cough and sputum production. Negative for shortness of breath.   Cardiovascular: Negative for chest pain.  Gastrointestinal: Negative for abdominal pain, constipation, diarrhea, nausea and vomiting.  Genitourinary: Negative for dysuria.  Musculoskeletal: Negative for joint pain.  Neurological: Negative for dizziness and headaches.   Exam: Physical Exam  Constitutional: He is oriented to person, place, and time.  HENT:  Nose: No mucosal edema.  Mouth/Throat: No oropharyngeal exudate or posterior oropharyngeal edema.  Eyes: Conjunctivae, EOM and lids are normal. Pupils are equal, round, and reactive to light.  Neck: No JVD present. Carotid bruit is not present. No edema present. No thyroid mass and no thyromegaly present.  Cardiovascular: S1 normal and S2 normal. Exam reveals no gallop.  No murmur heard. Pulses:      Dorsalis pedis pulses are 2+ on the right side, and 2+ on the left side.  Respiratory: No respiratory distress. He has decreased breath sounds in the right lower field and the left lower field. He has no wheezes. He has no rhonchi. He has no rales.  GI: Soft. Bowel sounds are normal. He exhibits distension. There is no tenderness.  Musculoskeletal:   Left ankle: He exhibits swelling.  Lymphadenopathy:    He has no cervical adenopathy.  Neurological: He is alert and oriented to person, place, and time. No cranial nerve deficit.  Skin: Skin is warm. No rash noted. Nails show no clubbing.  Psychiatric: He has a normal mood and affect.      Data Reviewed: Basic Metabolic Panel: Recent Labs  Lab 07/31/17 0412  08/01/17 0357 08/01/17 1754 08/02/17 0154 08/02/17 1805 08/03/17 0453 08/03/17 2015 08/04/17 0447 08/05/17 0557 08/06/17 0726  NA 136  --  134*  --  135  --  135 134* 134* 133* 132*  K 3.7   < > 3.9 4.3 4.0 4.1 3.7 3.8 3.6 4.2 3.7  CL 105  --  102  --  102  --  102 102 102 100* 98*  CO2 28  --  27  --  26  --  26 25 26 24 22   GLUCOSE 170*  --  188*  --  207*  --  97 204* 70 180* 215*  BUN 22*  --  24*  --  26*  --  28* 29* 29* 37* 53*  CREATININE 1.40*  --  1.32*  --  1.74*  --  2.04* 2.40* 2.53* 3.01* 3.56*  CALCIUM 8.1*  --  8.2*  --  7.9*  --  8.1* 7.6* 7.7* 7.9* 7.9*  MG 1.8   < > 1.9 1.9 1.8 2.1 2.0  --  2.2  --   --  PHOS 3.1  --  3.4  --   --   --   --   --   --   --  5.4*   < > = values in this interval not displayed.   Liver Function Tests: Recent Labs  Lab 08/03/17 2015 08/06/17 0726  AST 65*  --   ALT 43  --   ALKPHOS 80  --   BILITOT 0.6  --   PROT 5.3*  --   ALBUMIN 2.2* 2.4*   CBC: Recent Labs  Lab 08/01/17 0357 08/05/17 0557 08/06/17 0726  WBC 5.5 10.0 12.8*  HGB 7.2* 6.8* 7.0*  HCT 21.7* 20.6* 21.4*  MCV 79.7* 79.9* 80.3  PLT 285 309 370   Cardiac Enzymes: Recent Labs  Lab 08/04/17 1157 08/04/17 1758 08/04/17 2333 08/05/17 0559 08/05/17 1200  TROPONINI 0.07* 0.09* 0.07* 0.09* 0.08*   BNP (last 3 results) Recent Labs    06/11/17 0518 07/22/17 1640 07/29/17 0955  BNP 45.0 149.0* 93.0     CBG: Recent Labs  Lab 08/05/17 1140 08/05/17 1700 08/05/17 2116 08/06/17 0749 08/06/17 1141  GLUCAP 196* 314* 332* 193* 134*    Scheduled Meds: . amLODipine  5 mg Oral  Daily  . amoxicillin-clavulanate  1 tablet Oral Q12H  . aspirin EC  81 mg Oral Daily  . atorvastatin  40 mg Oral q1800  . carvedilol  12.5 mg Oral BID WC  . famotidine  20 mg Oral BID  . FLUoxetine  40 mg Oral Daily  . hydrALAZINE  50 mg Oral Q8H  . insulin aspart  0-5 Units Subcutaneous QHS  . insulin aspart  0-9 Units Subcutaneous TID WC  . insulin aspart  7 Units Subcutaneous TID WC  . insulin detemir  25 Units Subcutaneous QHS  . magnesium oxide  400 mg Oral BID  . mouth rinse  15 mL Mouth Rinse BID  . [START ON 08/07/2017] predniSONE  40 mg Oral Q breakfast   Followed by  . [START ON 08/08/2017] predniSONE  30 mg Oral Q breakfast   Followed by  . [START ON 08/09/2017] predniSONE  20 mg Oral Q breakfast   Followed by  . [START ON 08/10/2017] predniSONE  10 mg Oral Q breakfast   Continuous Infusions: . sodium chloride Stopped (08/05/17 1730)  . 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  Vancomycin discontinued Avoid nephrotoxins and  renal dose medications  #Symptomatic anemia from chronic kidney disease Hb- 7.2- 6.8-7.0 Transfused 1 unit of blood, will transfuse 1 more unit of blood  # Anasarca due to nephrotic syndrome and proteinuria.   patient Lasix drip currently on hold as renal function has gotten worse after CT angiogram was done      Daily weight monitoring and nephrology is following. Lasix 40 mg IV x1 is given today  # Sepsis secondary to multilobar pneumonia with bronchospasm with pleuritic chest pain troponins are non-trending continue Zosyn and vancomycin.  Flu test is negative. discontinue vancomycin , MRSA PCR is negative.  Will change Zosyn to Augmentin  Blood cultures are negative and urine cultures ordered but does not seem to be done Lactic acid in the normal range CT angiogram is negative for pulmonary embolism Steroid treatments  and bronchodilator therapy   # Type 2 diabetes mellitus .  Restarted on his usual insulin and sugars much better.  Patient  states that he has been compliant but his hemoglobin A1c is very elevated.  NovoLog 5 units 3 times daily with each meal Lantus dose reduced to 24 units from poor p.o. intake   # Hypokalemia and hypomagnesemia replace magnesium and potassium orally prn  # Essential hypertension -blood pressure elevated   on Norvasc, Coreg dose increased continue hydralazine and titrate as needed  # Hyperlipidemia unspecified on atorvastatin  # Depression on fluoxetine, psychiatry consulted  # Anemia of chronic disease.  Continue to monitor hemoglobin. Code Status:     Code Status Orders  (From admission, onward)        Start     Ordered   07/29/17 1356  Full code  Continuous     07/29/17 1355    Code Status History    Date Active Date Inactive Code Status Order ID Comments User Context   06/09/2017 15:22 06/12/2017 17:22 Full Code [id number]  Ramonita LabGouru, Davin Archuletta, MD Inpatient   04/14/2017 14:05 04/18/2017 20:05 Full Code [id number]  Shaune Pollackhen, Qing, MD Inpatient   09/09/2016 19:55 09/11/2016 18:59 Full Code [id number]  Altamese DillingVachhani, Vaibhavkumar, MD Inpatient   06/01/2016 19:26 06/02/2016 18:35 Full Code [id number]  Ramonita LabGouru, Cortney Beissel, MD Inpatient     Disposition Plan: To be determined  Consultants:  Nephrology  Time spent: 36 minutes  Plan of care discussed in detail with the patient and his brother [name] over phone . They verbalized understanding of the plan Ramonita LabAruna Loa Idler  [employer]

## 2017-08-06 NOTE — Consult Note (Signed)
[employer] Psychiatry Consult   Reason for Consult: Consult for 32 year old man with multiple severe chronic medical problems.  Concern about depression. Referring Physician:  Gouru Patient Identification: [name] MRN:  [id number] Principal Diagnosis: Adjustment disorder with mixed anxiety and depressed mood Diagnosis:   Patient Active Problem List   Diagnosis Date Noted  . Adjustment disorder with mixed anxiety and depressed mood [F43.23] 08/06/2017  . Anasarca [R60.1] 04/14/2017  . Dehydration [E86.0] 09/11/2016  . Elevated bilirubin [R17] 09/11/2016  . Acute gastroenteritis [K52.9] 09/11/2016  . Abscess of groin, right [L02.214] 09/09/2016  . Diabetic acidosis without coma ([place]) [E13.10] 06/01/2016  . Hypovitaminosis D [E55.9] 12/16/2015  . Anemia [D64.9] 12/14/2015  . Hypophosphatemia [E83.39] 12/14/2015  . 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 Essential hypertension with goal blood pressure less than 130/80 [I10] 02/07/2015  . Microcytic anemia [D50.9] 11/17/2014  . Hypercholesterolemia [E78.00] 02/25/2013  . AKI (acute kidney injury) ([place]) [N17.9] 02/19/2013  . Non-ST elevation myocardial infarction (NSTEMI) ([place]) [I21.4] 02/19/2013  . Poorly controlled type 1 diabetes mellitus ([place]) [E10.65] 03/03/2012    Total Time spent with patient: 1 hour  Subjective:   [name] is a 32 y.o. male patient admitted with "I am not feeling good".  HPI: Patient interviewed chart reviewed.  32 year old man with multiple severe chronic medical problems currently in the hospital with worsening problems with shortness of breath weakness diabetes.  Consult was requested as far as I can tell because yesterday the patient had a bowel movement and urinated in the bed.  On interview today the patient says he is still feeling pretty sick and weak.  Feels very short of breath today.  Feels nervous about it.  Denies however feeling  particularly sad or depressed.  Denies being hopeless.  Denies any suicidal thoughts.  Able to articulate positive things in his life to live for and positive things to look forward to.  Chronic difficulty sleeping especially when he is feeling more sick.  Appetite had been a little down but seems slightly better today.  No report of any hallucinations.  Patient is on Prozac 40 mg a day chronically.  Social history: Patient lives with one friend.  He says that when he is at his usual baseline health he is able to get up and move around the house and take care of himself.  The medical history: Patient has severe chronic kidney disease, diabetes, status post amputation of 1 leg currently with anasarca and a lot of swelling and fluid retention.  Has had multiple hospitalizations.  Severe sickness going back pretty much all through his life.  Substance abuse history: Patient denies any alcohol or drug abuse  Past Psychiatric History: There are no previous psychiatric notes.  Patient denies psychiatric hospitalization.  Denies any history of suicide attempts.  He is on Prozac 40 mg a day chronically.  He says he thinks that it was probably for depression but he is not certain.  He also vaguely thinks that it has been helpful.  Risk to Self: Is patient at risk for suicide?: No Risk to Others:   Prior Inpatient Therapy:   Prior Outpatient Therapy:    Past Medical History:  Past Medical History:  Diagnosis Date  . Abscess of groin, right 09/09/2016  . 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 Acute gastroenteritis 09/11/2016  . AKI (acute kidney injury) ([place]) 02/19/2013  . Anemia 12/14/2015  . Dehydration 09/11/2016  .  Diabetes mellitus without complication ([place])   . Diabetic acidosis without coma ([place]) 06/01/2016  . Elevated bilirubin 09/11/2016  . Essential hypertension with goal blood pressure less than 130/80 02/07/2015  . Hypercholesterolemia 02/25/2013  . Hyponatremia  09/11/2016  . Hypophosphatemia 12/14/2015  . Hypovitaminosis D 12/16/2015  . Mass of right inguinal region   . Microcytic anemia 11/17/2014  . Non-ST elevation myocardial infarction (NSTEMI) ([place]) 02/19/2013  . Poorly controlled type 1 diabetes mellitus ([place]) 03/03/2012   Overview:  Diagnosed 02/2012.  Now insulin-dependent  . Proteinuria 12/14/2015    Past Surgical History:  Procedure Laterality Date  . LEG AMPUTATION ABOVE KNEE Right    Family History:  Family History  Problem Relation Age of Onset  . Diabetes Mother    Family Psychiatric  History: Does not know of any Social History:  Social History   Substance and Sexual Activity  Alcohol Use No     Social History   Substance and Sexual Activity  Drug Use No    Social History   Socioeconomic History  . Marital status: Single    Spouse name: None  . Number of children: None  . Years of education: None  . Highest education level: None  Social Needs  . Financial resource strain: None  . Food insecurity - worry: None  . Food insecurity - inability: None  . Transportation needs - medical: None  . Transportation needs - non-medical: None  Occupational History  . None  Tobacco Use  . Smoking status: Never Smoker  . Smokeless tobacco: Never Used  Substance and Sexual Activity  . Alcohol use: No  . Drug use: No  . Sexual activity: None  Other Topics Concern  . None  Social History Narrative  . None   Additional Social History:    Allergies:   Allergies  Allergen Reactions  . Lisinopril Swelling    Labs:  Results for orders placed or performed during the hospital encounter of 07/29/17 (from the past 48 hour(s))  Glucose, capillary     Status: Abnormal   Collection Time: 08/04/17  4:55 PM  Result Value Ref Range   Glucose-Capillary 157 (H) 65 - 99 mg/dL  Troponin I (q 6hr x 3)     Status: Abnormal   Collection Time: 08/04/17  5:58 PM  Result Value Ref Range   Troponin I 0.09 (HH) <0.03 ng/mL    Comment:  CRITICAL VALUE NOTED. VALUE IS CONSISTENT WITH PREVIOUSLY REPORTED/CALLED VALUE BY CAF Performed at [facility], [address] [place], [place] [address]   Glucose, capillary     Status: Abnormal   Collection Time: 08/04/17  8:33 PM  Result Value Ref Range   Glucose-Capillary 226 (H) 65 - 99 mg/dL  Troponin I (q 6hr x 3)     Status: Abnormal   Collection Time: 08/04/17 11:33 PM  Result Value Ref Range   Troponin I 0.07 (HH) <0.03 ng/mL    Comment: CRITICAL VALUE NOTED. VALUE IS CONSISTENT WITH PREVIOUSLY REPORTED/CALLED VALUE. JAG Performed at [facility], [address] [place], [place] [address]   CBC     Status: Abnormal   Collection Time: 08/05/17  5:57 AM  Result Value Ref Range   WBC 10.0 3.8 - 10.6 K/uL   RBC 2.57 (L) 4.40 - 5.90 MIL/uL   Hemoglobin 6.8 (L) 13.0 - 18.0 g/dL   HCT 20.6 (L) 40.0 - 52.0 %   MCV 79.9 (L) 80.0 - 100.0 fL   MCH 26.4 26.0 -  34.0 pg   MCHC 33.1 32.0 - 36.0 g/dL   RDW 15.2 (H) 11.5 - 14.5 %   Platelets 309 150 - 440 K/uL    Comment: Performed at [facility], [address] [place], [place] [address]  Basic metabolic panel     Status: Abnormal   Collection Time: 08/05/17  5:57 AM  Result Value Ref Range   Sodium 133 (L) 135 - 145 mmol/L   Potassium 4.2 3.5 - 5.1 mmol/L   Chloride 100 (L) 101 - 111 mmol/L   CO2 24 22 - 32 mmol/L   Glucose, Bld 180 (H) 65 - 99 mg/dL   BUN 37 (H) 6 - 20 mg/dL   Creatinine, Ser 3.01 (H) 0.61 - 1.24 mg/dL   Calcium 7.9 (L) 8.9 - 10.3 mg/dL   GFR calc non Af Amer 26 (L) >60 mL/min   GFR calc Af Amer 30 (L) >60 mL/min    Comment: (NOTE) The eGFR has been calculated using the CKD EPI equation. This calculation has not been validated in all clinical situations. eGFR's persistently <60 mL/min signify possible Chronic Kidney Disease.    Anion gap 9 5 - 15    Comment: Performed at [facility], [address] [place], [place] [address]  ABO/Rh     Status: None    Collection Time: 08/05/17  5:57 AM  Result Value Ref Range   ABO/RH(D)      O POS Performed at [facility], [address], [place], [place] [address]   Troponin I (q 6hr x 3)     Status: Abnormal   Collection Time: 08/05/17  5:59 AM  Result Value Ref Range   Troponin I 0.09 (HH) <0.03 ng/mL    Comment: CRITICAL VALUE NOTED. VALUE IS CONSISTENT WITH PREVIOUSLY REPORTED/CALLED VALUE/HKP Performed at [facility], [address] [place], [place] [address]   Glucose, capillary     Status: Abnormal   Collection Time: 08/05/17  7:25 AM  Result Value Ref Range   Glucose-Capillary 155 (H) 65 - 99 mg/dL  Prepare RBC     Status: None   Collection Time: 08/05/17  9:30 AM  Result Value Ref Range   Order Confirmation      ORDER PROCESSED BY BLOOD BANK Performed at [facility], [address] [place], [place] [address]   Type and screen     Status: None (Preliminary result)   Collection Time: 08/05/17 10:02 AM  Result Value Ref Range   ABO/RH(D) O POS    Antibody Screen NEG    Sample Expiration      08/08/2017 Performed at [facility], [address], [place], [place] [address]    Unit Number [id number]    Blood Component Type RED CELLS,LR    Unit division 00    Status of Unit ISSUED,FINAL    Transfusion Status OK TO TRANSFUSE    Crossmatch Result Compatible    Unit Number [id number]    Blood Component Type RED CELLS,LR    Unit division 00    Status of Unit ALLOCATED    Transfusion Status OK TO TRANSFUSE    Crossmatch Result Compatible   Glucose, capillary     Status: Abnormal   Collection Time: 08/05/17 11:40 AM  Result Value Ref Range   Glucose-Capillary 196 (H) 65 - 99 mg/dL  Troponin I (q 6hr x 3)     Status: Abnormal   Collection Time: 08/05/17 12:00 PM  Result Value Ref Range  Troponin I 0.08 (HH) <0.03 ng/mL    Comment: CRITICAL VALUE NOTED. VALUE IS CONSISTENT WITH PREVIOUSLY REPORTED/CALLED VALUE/HKP Performed at  [facility], [address] [place], [place] [address]   Glucose, capillary     Status: Abnormal   Collection Time: 08/05/17  5:00 PM  Result Value Ref Range   Glucose-Capillary 314 (H) 65 - 99 mg/dL  Glucose, capillary     Status: Abnormal   Collection Time: 08/05/17  9:16 PM  Result Value Ref Range   Glucose-Capillary 332 (H) 65 - 99 mg/dL  Renal function panel     Status: Abnormal   Collection Time: 08/06/17  7:26 AM  Result Value Ref Range   Sodium 132 (L) 135 - 145 mmol/L   Potassium 3.7 3.5 - 5.1 mmol/L   Chloride 98 (L) 101 - 111 mmol/L   CO2 22 22 - 32 mmol/L   Glucose, Bld 215 (H) 65 - 99 mg/dL   BUN 53 (H) 6 - 20 mg/dL   Creatinine, Ser 3.56 (H) 0.61 - 1.24 mg/dL   Calcium 7.9 (L) 8.9 - 10.3 mg/dL   Phosphorus 5.4 (H) 2.5 - 4.6 mg/dL   Albumin 2.4 (L) 3.5 - 5.0 g/dL   GFR calc non Af Amer 21 (L) >60 mL/min   GFR calc Af Amer 24 (L) >60 mL/min    Comment: (NOTE) The eGFR has been calculated using the CKD EPI equation. This calculation has not been validated in all clinical situations. eGFR's persistently <60 mL/min signify possible Chronic Kidney Disease.    Anion gap 12 5 - 15    Comment: Performed at [facility], [address] [place], [place] [address]  CBC     Status: Abnormal   Collection Time: 08/06/17  7:26 AM  Result Value Ref Range   WBC 12.8 (H) 3.8 - 10.6 K/uL   RBC 2.67 (L) 4.40 - 5.90 MIL/uL   Hemoglobin 7.0 (L) 13.0 - 18.0 g/dL   HCT 21.4 (L) 40.0 - 52.0 %   MCV 80.3 80.0 - 100.0 fL   MCH 26.3 26.0 - 34.0 pg   MCHC 32.8 32.0 - 36.0 g/dL   RDW 15.2 (H) 11.5 - 14.5 %   Platelets 370 150 - 440 K/uL    Comment: Performed at [facility], [address] [place], [place] [address]  Glucose, capillary     Status: Abnormal   Collection Time: 08/06/17  7:49 AM  Result Value Ref Range   Glucose-Capillary 193 (H) 65 - 99 mg/dL  Glucose, capillary     Status: Abnormal   Collection Time: 08/06/17 11:41 AM  Result Value  Ref Range   Glucose-Capillary 134 (H) 65 - 99 mg/dL  Prepare RBC     Status: None   Collection Time: 08/06/17 12:00 PM  Result Value Ref Range   Order Confirmation      ORDER PROCESSED BY BLOOD BANK Performed at [facility], [address], [place], [place] [address]     Current Facility-Administered Medications  Medication Dose Route Frequency Provider Last Rate Last Dose  . 0.9 %  sodium chloride infusion   Intravenous Once Nicholes Mango, MD   Stopped at 08/05/17 1730  . 0.9 %  sodium chloride infusion   Intravenous Once Gouru, Aruna, MD      . acetaminophen (TYLENOL) tablet 650 mg  650 mg Oral Q6H PRN Vaughan Basta, MD   650 mg at 08/05/17 1442  . alum & mag hydroxide-simeth (MAALOX/MYLANTA) 200-200-20  MG/5ML suspension 15 mL  15 mL Oral Q4H PRN Gouru, Aruna, MD   15 mL at 08/05/17 1112  . amLODipine (NORVASC) tablet 5 mg  5 mg Oral Daily Lateef, Munsoor, MD   5 mg at 08/06/17 0846  . amoxicillin-clavulanate (AUGMENTIN) 875-125 MG per tablet 1 tablet  1 tablet Oral Q12H Nicholes Mango, MD   1 tablet at 08/06/17 0846  . aspirin EC tablet 81 mg  81 mg Oral Daily Vaughan Basta, MD   81 mg at 08/06/17 0846  . atorvastatin (LIPITOR) tablet 40 mg  40 mg Oral q1800 Vaughan Basta, MD   40 mg at 08/05/17 1722  . carvedilol (COREG) tablet 12.5 mg  12.5 mg Oral BID WC Gouru, Aruna, MD      . docusate sodium (COLACE) capsule 100 mg  100 mg Oral BID PRN Vaughan Basta, MD      . famotidine (PEPCID) tablet 20 mg  20 mg Oral BID Gouru, Aruna, MD   20 mg at 08/06/17 0846  . FLUoxetine (PROZAC) capsule 40 mg  40 mg Oral Daily Vaughan Basta, MD   40 mg at 08/06/17 0845  . hydrALAZINE (APRESOLINE) tablet 50 mg  50 mg Oral Q8H Loletha Grayer, MD   50 mg at 08/06/17 0240  . insulin aspart (novoLOG) injection 0-5 Units  0-5 Units Subcutaneous QHS Loletha Grayer, MD   4 Units at 08/05/17 2146  . insulin aspart (novoLOG) injection 0-9 Units  0-9 Units  Subcutaneous TID WC Vaughan Basta, MD   1 Units at 08/06/17 1249  . insulin aspart (novoLOG) injection 7 Units  7 Units Subcutaneous TID WC Gouru, Aruna, MD   7 Units at 08/06/17 1246  . insulin detemir (LEVEMIR) injection 25 Units  25 Units Subcutaneous QHS Gouru, Aruna, MD      . levalbuterol (XOPENEX) nebulizer solution 1.25 mg  1.25 mg Nebulization Q6H PRN Lance Coon, MD   1.25 mg at 08/06/17 1012  . magnesium oxide (MAG-OX) tablet 400 mg  400 mg Oral BID Loletha Grayer, MD   400 mg at 08/06/17 0846  . MEDLINE mouth rinse  15 mL Mouth Rinse BID Gouru, Aruna, MD   15 mL at 08/06/17 0845  . morphine 2 MG/ML injection 2 mg  2 mg Intravenous Q6H PRN Henreitta Leber, MD   2 mg at 08/04/17 1104  . [START ON 08/07/2017] predniSONE (DELTASONE) tablet 40 mg  40 mg Oral Q breakfast Gouru, Aruna, MD       Followed by  . [START ON 08/08/2017] predniSONE (DELTASONE) tablet 30 mg  30 mg Oral Q breakfast Gouru, Aruna, MD       Followed by  . [START ON 08/09/2017] predniSONE (DELTASONE) tablet 20 mg  20 mg Oral Q breakfast Gouru, Aruna, MD       Followed by  . [START ON 08/10/2017] predniSONE (DELTASONE) tablet 10 mg  10 mg Oral Q breakfast Gouru, Aruna, MD        Musculoskeletal: Strength & Muscle Tone: decreased Gait & Station: unable to stand Patient leans: N/A  Psychiatric Specialty Exam: Physical Exam  Nursing note and vitals reviewed. Constitutional: He appears well-developed and well-nourished.  HENT:  Head: Normocephalic and atraumatic.  Eyes: Conjunctivae are normal. Pupils are equal, round, and reactive to light.  Neck: Normal range of motion.  Cardiovascular: Regular rhythm and normal heart sounds.  Respiratory: He is in respiratory distress.  GI: Soft.  Musculoskeletal: Normal range of motion.  Neurological: He is alert.  Skin:  Skin is warm and dry.  Psychiatric: Judgment normal. His mood appears anxious. His speech is delayed. He is slowed. Thought content is not paranoid.  Cognition and memory are normal. He expresses no homicidal and no suicidal ideation.    Review of Systems  Constitutional: Negative.   HENT: Negative.   Eyes: Negative.   Respiratory: Positive for shortness of breath.   Cardiovascular: Positive for chest pain.  Gastrointestinal: Negative.   Musculoskeletal: Negative.   Skin: Negative.   Neurological: Negative.   Psychiatric/Behavioral: Negative for depression, hallucinations, memory loss, substance abuse and suicidal ideas. The patient has insomnia. The patient is not nervous/anxious.     Blood pressure (!) 161/82, pulse 99, temperature (!) 97.5 F (36.4 C), temperature source Oral, resp. rate 20, height 5' 6"  (1.676 m), weight 107.6 kg (237 lb 3.4 oz), SpO2 92 %.Body mass index is 38.29 kg/m.  General Appearance: Casual  Eye Contact:  Fair  Speech:  Slow and Patient seems to be doing his best but has a lot of trouble talking because of how short of breath he is  Volume:  Decreased  Mood:  Anxious  Affect:  Congruent  Thought Process:  Goal Directed  Orientation:  Full (Time, Place, and Person)  Thought Content:  Logical  Suicidal Thoughts:  No  Homicidal Thoughts:  No  Memory:  Immediate;   Fair Recent;   Fair Remote;   Fair  Judgement:  Fair  Insight:  Fair  Psychomotor Activity:  Decreased  Concentration:  Concentration: Fair  Recall:  [employer] of Knowledge:  Fair  Language:  Fair  Akathisia:  No  Handed:  Right  AIMS (if indicated):     Assets:  Communication Skills Desire for Improvement Housing Resilience  ADL's:  Impaired  Cognition:  Impaired,  Mild  Sleep:        Treatment Plan Summary: Daily contact with patient to assess and evaluate symptoms and progress in treatment, Medication management and Plan 32 year old man with chronic multiple medical problems mostly stemming from his diabetes.  Patient is currently feeling pretty sick.  He is short of breath and it takes quite an effort for him to talk.   Nevertheless he appeared to be cooperative with the interview.  He did not report acute symptoms of depression.  Did not report any sense of hopelessness.  Does not appear to be having any suicidal thoughts or psychosis.  No indication that I see to change any acute medication.  Patient agrees with me supportive counseling.  We will follow-up as needed.  Disposition: No evidence of imminent risk to self or others at present.   Patient does not meet criteria for psychiatric inpatient admission. Supportive therapy provided about ongoing stressors.  Alethia Berthold, MD 08/06/2017 4:23 PM

## 2017-08-06 NOTE — Progress Notes (Addendum)
Inpatient Diabetes Program Recommendations  AACE/ADA: New Consensus Statement on Inpatient Glycemic Control (2015)  Target Ranges:  Prepandial:   less than 140 mg/dL      Peak postprandial:   less than 180 mg/dL (1-2 hours)      Critically ill patients:  140 - 180 mg/dL   Results for [name], [name] (MRN [id number]) as of 08/06/2017 09:49  Ref. Range 08/05/2017 07:25 08/05/2017 11:40 08/05/2017 17:00 08/05/2017 21:16  Glucose-Capillary Latest Ref Range: 65 - 99 mg/dL 409155 (H) 811196 (H) 914314 (H) 332 (H)   Results for [name], [name] (MRN [id number]) as of 08/06/2017 09:49  Ref. Range 08/06/2017 07:49  Glucose-Capillary Latest Ref Range: 65 - 99 mg/dL 086193 (H)    Admit: Anasarca likely due to combination of nephrotic range proteinuria and hypoalbuminemia and excessive fluid intake  History: DM  Home DM Meds: Levemir 30 units QHS                             Novolog 10 units TID  Current Orders: Novolog Sensitive Correction Scale/ SSI (0-9 units) TID AC + HS                           Levemir 30 units QHS     Novolog 5 units TID      Note Solumedrol stopped.  Last dose given yesterday at 5pm.  Now getting Prednisone 40 mg daily.   MD- Please consider the following in-hospital insulin adjustments:  1. Increase Levemir slightly to 25 units QHS  2. Increase Novolog Meal Coverage slightly to: Novolog 7 units TID with meals (hold if pt eats <50% of meal)      --Will follow patient during hospitalization--  Ambrose FinlandJeannine Johnston Nikoloz Huy RN, MSN, CDE Diabetes Coordinator Inpatient Glycemic Control Team Team Pager: [phone] (8a-5p)

## 2017-08-07 ENCOUNTER — Inpatient Hospital Stay: Payer: Medicare Other

## 2017-08-07 ENCOUNTER — Inpatient Hospital Stay (HOSPITAL_COMMUNITY)
Admit: 2017-08-07 | Discharge: 2017-08-07 | Disposition: A | Discharge status: Home / Self Care | Payer: Medicare Other | Attending: Adult Health | Admitting: Adult Health

## 2017-08-07 DIAGNOSIS — J9601 Acute respiratory failure with hypoxia: Secondary | ICD-10-CM

## 2017-08-07 DIAGNOSIS — I340 Nonrheumatic mitral (valve) insufficiency: Secondary | ICD-10-CM

## 2017-08-07 LAB — CBC
HEMATOCRIT: 25.5 % — AB (ref 40.0–52.0)
HEMATOCRIT: 26.5 % — AB (ref 40.0–52.0)
HEMOGLOBIN: 8.5 g/dL — AB (ref 13.0–18.0)
HEMOGLOBIN: 8.6 g/dL — AB (ref 13.0–18.0)
MCH: 26.8 pg (ref 26.0–34.0)
MCH: 26.3 pg (ref 26.0–34.0)
MCHC: 33.4 g/dL (ref 32.0–36.0)
MCHC: 32.6 g/dL (ref 32.0–36.0)
MCV: 80.3 fL (ref 80.0–100.0)
MCV: 80.7 fL (ref 80.0–100.0)
Platelets: 374 10*3/uL (ref 150–440)
Platelets: 408 10*3/uL (ref 150–440)
RBC: 3.18 MIL/uL — ABNORMAL LOW (ref 4.40–5.90)
RBC: 3.28 MIL/uL — ABNORMAL LOW (ref 4.40–5.90)
RDW: 15.2 % — AB (ref 11.5–14.5)
RDW: 15.3 % — ABNORMAL HIGH (ref 11.5–14.5)
WBC: 12.3 10*3/uL — ABNORMAL HIGH (ref 3.8–10.6)
WBC: 12.3 10*3/uL — ABNORMAL HIGH (ref 3.8–10.6)

## 2017-08-07 LAB — BLOOD GAS, ARTERIAL
Acid-Base Excess: 3 mmol/L — ABNORMAL HIGH (ref 0.0–2.0)
Bicarbonate: 27 mmol/L (ref 20.0–28.0)
DELIVERY SYSTEMS: POSITIVE
FIO2: 0.55
O2 Saturation: 93.6 %
PATIENT TEMPERATURE: 37.2
pCO2 arterial: 38 mmHg (ref 32.0–48.0)
pH, Arterial: 7.46 — ABNORMAL HIGH (ref 7.350–7.450)
pO2, Arterial: 66 mmHg — ABNORMAL LOW (ref 83.0–108.0)

## 2017-08-07 LAB — GLUCOSE, CAPILLARY
GLUCOSE-CAPILLARY: 167 mg/dL — AB (ref 65–99)
GLUCOSE-CAPILLARY: 73 mg/dL (ref 65–99)
GLUCOSE-CAPILLARY: 114 mg/dL — AB (ref 65–99)
GLUCOSE-CAPILLARY: 226 mg/dL — AB (ref 65–99)
Glucose-Capillary: 73 mg/dL (ref 65–99)
Glucose-Capillary: 157 mg/dL — ABNORMAL HIGH (ref 65–99)

## 2017-08-07 LAB — BASIC METABOLIC PANEL
Anion gap: 10 (ref 5–15)
BUN: 56 mg/dL — AB (ref 6–20)
CHLORIDE: 99 mmol/L — AB (ref 101–111)
CO2: 24 mmol/L (ref 22–32)
Calcium: 7.9 mg/dL — ABNORMAL LOW (ref 8.9–10.3)
Creatinine, Ser: 3.54 mg/dL — ABNORMAL HIGH (ref 0.61–1.24)
GFR calc Af Amer: 24 mL/min — ABNORMAL LOW (ref >60)
GFR, EST NON AFRICAN AMERICAN: 21 mL/min — AB (ref >60)
Glucose, Bld: 227 mg/dL — ABNORMAL HIGH (ref 65–99)
POTASSIUM: 3.9 mmol/L (ref 3.5–5.1)
SODIUM: 133 mmol/L — AB (ref 135–145)

## 2017-08-07 LAB — TYPE AND SCREEN
ABO/RH(D): O POS
ANTIBODY SCREEN: NEGATIVE
UNIT DIVISION: 0
Unit division: 0

## 2017-08-07 LAB — BPAM RBC
BLOOD PRODUCT EXPIRATION DATE: 201902202359
Blood Product Expiration Date: 201902232359
ISSUE DATE / TIME: 201901291444
ISSUE DATE / TIME: 201901301949
Unit Type and Rh: 5100
Unit Type and Rh: 5100

## 2017-08-07 LAB — LACTIC ACID, PLASMA: LACTIC ACID, VENOUS: 0.7 mmol/L (ref 0.5–1.9)

## 2017-08-07 LAB — COMPREHENSIVE METABOLIC PANEL
ALBUMIN: 2.3 g/dL — AB (ref 3.5–5.0)
ALK PHOS: 84 U/L (ref 38–126)
ALT: 21 U/L (ref 17–63)
ANION GAP: 10 (ref 5–15)
AST: 27 U/L (ref 15–41)
BILIRUBIN TOTAL: 0.6 mg/dL (ref 0.3–1.2)
BUN: 57 mg/dL — ABNORMAL HIGH (ref 6–20)
CALCIUM: 8.3 mg/dL — AB (ref 8.9–10.3)
CO2: 23 mmol/L (ref 22–32)
CREATININE: 3.43 mg/dL — AB (ref 0.61–1.24)
Chloride: 102 mmol/L (ref 101–111)
GFR calc non Af Amer: 22 mL/min — ABNORMAL LOW (ref >60)
GFR, EST AFRICAN AMERICAN: 25 mL/min — AB (ref >60)
GLUCOSE: 75 mg/dL (ref 65–99)
Potassium: 4 mmol/L (ref 3.5–5.1)
Sodium: 135 mmol/L (ref 135–145)
TOTAL PROTEIN: 5.7 g/dL — AB (ref 6.5–8.1)

## 2017-08-07 LAB — TROPONIN I
TROPONIN I: 0.05 ng/mL — AB (ref <0.03)
Troponin I: 0.05 ng/mL (ref <0.03)

## 2017-08-07 LAB — MAGNESIUM
MAGNESIUM: 2.4 mg/dL (ref 1.7–2.4)
Magnesium: 2.5 mg/dL — ABNORMAL HIGH (ref 1.7–2.4)

## 2017-08-07 LAB — PROCALCITONIN: Procalcitonin: 1.44 ng/mL

## 2017-08-07 LAB — PHOSPHORUS: Phosphorus: 5.3 mg/dL — ABNORMAL HIGH (ref 2.5–4.6)

## 2017-08-07 LAB — ECHOCARDIOGRAM COMPLETE
Height: 66 in
Weight: 3883.2 oz

## 2017-08-07 LAB — BRAIN NATRIURETIC PEPTIDE: B NATRIURETIC PEPTIDE 5: 398 pg/mL — AB (ref 0.0–100.0)

## 2017-08-07 MED ORDER — MIDAZOLAM HCL 5 MG/5ML IJ SOLN
INTRAMUSCULAR | Status: AC
  Filled 2017-08-07: qty 5

## 2017-08-07 MED ORDER — FUROSEMIDE 10 MG/ML IJ SOLN
40.0000 mg | Freq: Once | INTRAMUSCULAR | Status: AC
  Administered 2017-08-07: 40 mg via INTRAVENOUS
  Filled 2017-08-07: qty 4

## 2017-08-07 MED ORDER — METHYLPREDNISOLONE SODIUM SUCC 125 MG IJ SOLR
125.0000 mg | Freq: Once | INTRAMUSCULAR | Status: AC
  Administered 2017-08-07: 125 mg via INTRAVENOUS
  Filled 2017-08-07: qty 2

## 2017-08-07 MED ORDER — BUDESONIDE 0.25 MG/2ML IN SUSP
0.2500 mg | Freq: Two times a day (BID) | RESPIRATORY_TRACT | Status: DC
  Administered 2017-08-07: 0.25 mg via RESPIRATORY_TRACT
  Filled 2017-08-07 (×2): qty 2

## 2017-08-07 MED ORDER — BUDESONIDE 0.25 MG/2ML IN SUSP
0.2500 mg | Freq: Two times a day (BID) | RESPIRATORY_TRACT | Status: DC
  Administered 2017-08-07 – 2017-08-14 (×13): 0.25 mg via RESPIRATORY_TRACT
  Filled 2017-08-07 (×12): qty 2

## 2017-08-07 MED ORDER — SODIUM CHLORIDE 0.9% FLUSH: 10.0000 mL | INTRAVENOUS | Status: DC | PRN

## 2017-08-07 MED ORDER — HEPARIN SODIUM (PORCINE) 1000 UNIT/ML IJ SOLN
INTRAMUSCULAR | Status: AC
  Filled 2017-08-07: qty 1

## 2017-08-07 MED ORDER — IPRATROPIUM-ALBUTEROL 0.5-2.5 (3) MG/3ML IN SOLN
3.0000 mL | RESPIRATORY_TRACT | Status: DC
  Administered 2017-08-07 – 2017-08-09 (×12): 3 mL via RESPIRATORY_TRACT
  Filled 2017-08-07 (×12): qty 3

## 2017-08-07 MED ORDER — MORPHINE SULFATE (PF) 2 MG/ML IV SOLN
2.0000 mg | Freq: Once | INTRAVENOUS | Status: AC
  Administered 2017-08-07: 2 mg via INTRAVENOUS

## 2017-08-07 MED ORDER — INSULIN ASPART 100 UNIT/ML ~~LOC~~ SOLN
0.0000 [IU] | SUBCUTANEOUS | Status: DC
  Administered 2017-08-07: 7 [IU] via SUBCUTANEOUS
  Administered 2017-08-07: 4 [IU] via SUBCUTANEOUS
  Administered 2017-08-08: 7 [IU] via SUBCUTANEOUS
  Administered 2017-08-08: 4 [IU] via SUBCUTANEOUS
  Filled 2017-08-07 (×4): qty 1

## 2017-08-07 MED ORDER — FENTANYL CITRATE (PF) 100 MCG/2ML IJ SOLN
INTRAMUSCULAR | Status: AC
  Filled 2017-08-07: qty 2

## 2017-08-07 MED ORDER — FUROSEMIDE 10 MG/ML IJ SOLN
10.0000 mg/h | INTRAVENOUS | Status: DC
  Administered 2017-08-07: 8 mg/h via INTRAVENOUS
  Administered 2017-08-08 – 2017-08-10 (×2): 10 mg/h via INTRAVENOUS
  Filled 2017-08-07 (×3): qty 25

## 2017-08-07 MED ORDER — HEPARIN SODIUM (PORCINE) 5000 UNIT/ML IJ SOLN
5000.0000 [IU] | Freq: Three times a day (TID) | INTRAMUSCULAR | Status: DC
  Administered 2017-08-07 – 2017-08-14 (×22): 5000 [IU] via SUBCUTANEOUS
  Filled 2017-08-07 (×22): qty 1

## 2017-08-07 MED ORDER — METHYLPREDNISOLONE SODIUM SUCC 40 MG IJ SOLR: 40.0000 mg | Freq: Four times a day (QID) | INTRAMUSCULAR | Status: DC

## 2017-08-07 MED ORDER — PIPERACILLIN-TAZOBACTAM 3.375 G IVPB
3.3750 g | Freq: Three times a day (TID) | INTRAVENOUS | Status: AC
  Administered 2017-08-07 – 2017-08-08 (×4): 3.375 g via INTRAVENOUS
  Filled 2017-08-07 (×4): qty 50

## 2017-08-07 MED ORDER — SODIUM CHLORIDE 0.9% FLUSH
10.0000 mL | Freq: Two times a day (BID) | INTRAVENOUS | Status: DC
  Administered 2017-08-07 – 2017-08-10 (×5): 10 mL
  Administered 2017-08-11: 33 mL
  Administered 2017-08-11 – 2017-08-14 (×6): 10 mL

## 2017-08-07 MED ORDER — SALINE SPRAY 0.65 % NA SOLN
1.0000 | NASAL | Status: DC | PRN
  Administered 2017-08-07: 1 via NASAL
  Filled 2017-08-07: qty 44

## 2017-08-07 MED ORDER — METHYLPREDNISOLONE SODIUM SUCC 40 MG IJ SOLR
40.0000 mg | Freq: Two times a day (BID) | INTRAMUSCULAR | Status: DC
  Administered 2017-08-07 – 2017-08-13 (×12): 40 mg via INTRAVENOUS
  Filled 2017-08-07 (×12): qty 1

## 2017-08-07 NOTE — Progress Notes (Signed)
Patient ID: [name], male   DOB: May 26, 1986, 32 y.o.   MRN: [id number]  [facility] PROGRESS NOTE  Dinh Gala Lewandowsky JXB:[id number] DOB: [birth year] DOA: 07/29/2017 PCP: Oswaldo Conroy, MD  HPI/Subjective: Patient is very short of breath today.  Congested and face is puffy    Objective: Vitals:   08/07/17 1006 08/07/17 1328  BP:  (!) 166/82  Pulse:  95  Resp:    Temp:  98.3 F (36.8 C)  SpO2: 91% 100%    Filed Weights   08/05/17 0513 08/06/17 0526 08/07/17 0509  Weight: 107.2 kg (236 lb 6.4 oz) 107.6 kg (237 lb 3.4 oz) 110.1 kg (242 lb 11.2 oz)    ROS: Review of Systems  Constitutional: Negative for chills and fever.  Eyes: Negative for blurred vision.  Respiratory: Positive for cough, sputum production and shortness of breath.   Cardiovascular: Negative for chest pain.  Gastrointestinal: Negative for abdominal pain, constipation, diarrhea, nausea and vomiting.  Genitourinary: Negative for dysuria.  Musculoskeletal: Negative for joint pain.  Neurological: Negative for dizziness and headaches.   Exam: Physical Exam  Constitutional: He is oriented to person, place, and time.  HENT:  Nose: No mucosal edema.  Mouth/Throat: No oropharyngeal exudate or posterior oropharyngeal edema.  Eyes: Conjunctivae, EOM and lids are normal. Pupils are equal, round, and reactive to light.  Neck: No JVD present. Carotid bruit is not present. No edema present. No thyroid mass and no thyromegaly present.  Cardiovascular: S1 normal and S2 normal. Exam reveals no gallop.  No murmur heard. Pulses:      Dorsalis pedis pulses are 2+ on the right side, and 2+ on the left side.  Respiratory: No respiratory distress. He has decreased breath sounds in the right lower field and the left lower field. He has no wheezes. He has no rhonchi. He has no rales.  GI: Soft. Bowel sounds are normal. He exhibits distension. There is no tenderness.  Musculoskeletal:       Left ankle: He exhibits  swelling.  Lymphadenopathy:    He has no cervical adenopathy.  Neurological: He is alert and oriented to person, place, and time. No cranial nerve deficit.  Skin: Skin is warm. No rash noted. Nails show no clubbing.  Psychiatric: He has a normal mood and affect.      Data Reviewed: Basic Metabolic Panel: Recent Labs  Lab 08/01/17 0357  08/02/17 0154 08/02/17 1805 08/03/17 0453 08/03/17 2015 08/04/17 0447 08/05/17 0557 08/06/17 0726 08/07/17 0230  NA 134*  --  135  --  135 134* 134* 133* 132* 133*  K 3.9   < > 4.0 4.1 3.7 3.8 3.6 4.2 3.7 3.9  CL 102  --  102  --  102 102 102 100* 98* 99*  CO2 27  --  26  --  26 25 26 24 22 24   GLUCOSE 188*  --  207*  --  97 204* 70 180* 215* 227*  BUN 24*  --  26*  --  28* 29* 29* 37* 53* 56*  CREATININE 1.32*  --  1.74*  --  2.04* 2.40* 2.53* 3.01* 3.56* 3.54*  CALCIUM 8.2*  --  7.9*  --  8.1* 7.6* 7.7* 7.9* 7.9* 7.9*  MG 1.9   < > 1.8 2.1 2.0  --  2.2  --   --  2.4  PHOS 3.4  --   --   --   --   --   --   --  5.4*  --    < > =  values in this interval not displayed.   Liver Function Tests: Recent Labs  Lab 08/03/17 2015 08/06/17 0726  AST 65*  --   ALT 43  --   ALKPHOS 80  --   BILITOT 0.6  --   PROT 5.3*  --   ALBUMIN 2.2* 2.4*   CBC: Recent Labs  Lab 08/01/17 0357 08/05/17 0557 08/06/17 0726 08/07/17 0230  WBC 5.5 10.0 12.8* 12.3*  HGB 7.2* 6.8* 7.0* 8.5*  HCT 21.7* 20.6* 21.4* 25.5*  MCV 79.7* 79.9* 80.3 80.3  PLT 285 309 370 374   Cardiac Enzymes: Recent Labs  Lab 08/04/17 1157 08/04/17 1758 08/04/17 2333 08/05/17 0559 08/05/17 1200  TROPONINI 0.07* 0.09* 0.07* 0.09* 0.08*   BNP (last 3 results) Recent Labs    07/22/17 1640 07/29/17 0955 08/07/17 0230  BNP 149.0* 93.0 398.0*     CBG: Recent Labs  Lab 08/06/17 1646 08/06/17 2044 08/07/17 0737 08/07/17 1210 08/07/17 1449  GLUCAP 166* 146* 167* 73 73    Scheduled Meds: . amLODipine  5 mg Oral Daily  . amoxicillin-clavulanate  1 tablet Oral  Q12H  . aspirin EC  81 mg Oral Daily  . atorvastatin  40 mg Oral q1800  . carvedilol  12.5 mg Oral BID WC  . famotidine  20 mg Oral BID  . FLUoxetine  40 mg Oral Daily  . furosemide  40 mg Intravenous Once  . hydrALAZINE  50 mg Oral Q8H  . insulin aspart  0-5 Units Subcutaneous QHS  . insulin aspart  0-9 Units Subcutaneous TID WC  . insulin aspart  7 Units Subcutaneous TID WC  . insulin detemir  25 Units Subcutaneous QHS  . magnesium oxide  400 mg Oral BID  . mouth rinse  15 mL Mouth Rinse BID  . methylPREDNISolone (SOLU-MEDROL) injection  40 mg Intravenous Q6H  . sodium chloride flush  10-40 mL Intracatheter Q12H   Continuous Infusions: . sodium chloride Stopped (08/05/17 1730)  . furosemide (LASIX) infusion      Assessment/Plan:  #Acute respiratory failure secondary to fluid overload  And anasarca due to nephrotic syndrome and proteinuria.   Lasix 80 mg IV stat was given.  No significant clinical improvement noticed patient is placed on BiPAP, transferred patient to stepdown unit Pulmonology consulted, notified to nephrology  patient Lasix drip  on hold as renal function has gotten worse after CT angiogram was done      Daily weight monitoring and nephrology is following. 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  Vancomycin discontinued Avoid nephrotoxins and  renal dose medications  #Symptomatic anemia from chronic kidney disease Hb- 7.2- 6.8-7.0--8.5 Transfused 2 units of blood    # Sepsis secondary to multilobar pneumonia with bronchospasm with pleuritic chest pain troponins are non-trending continue Zosyn and vancomycin.  Flu test is negative. discontinue vancomycin , MRSA PCR is negative.  Will change Zosyn to Augmentin  Blood cultures are negative and urine cultures ordered but does not seem to be done Lactic acid in the normal range CT  angiogram is negative for pulmonary embolism Steroid treatments and bronchodilator therapy   # Type 2 diabetes mellitus .  Restarted on his usual insulin and sugars much better.  Patient states that he has been compliant but his hemoglobin A1c is very elevated.  NovoLog 5 units 3 times daily with each meal Lantus dose reduced to 24 units from  poor p.o. intake   # Hypokalemia and hypomagnesemia replace magnesium and potassium orally prn  # Essential hypertension -blood pressure elevated   on Norvasc, Coreg dose increased continue hydralazine and titrate as needed  # Hyperlipidemia unspecified on atorvastatin  # Depression on fluoxetine, psychiatry consulted  # Anemia of chronic disease.  Continue to monitor hemoglobin. Code Status:     Code Status Orders  (From admission, onward)        Start     Ordered   07/29/17 1356  Full code  Continuous     07/29/17 1355    Code Status History    Date Active Date Inactive Code Status Order ID Comments User Context   06/09/2017 15:22 06/12/2017 17:22 Full Code [id number]  Ramonita LabGouru, Toneshia Coello, MD Inpatient   04/14/2017 14:05 04/18/2017 20:05 Full Code [id number]  Shaune Pollackhen, Qing, MD Inpatient   09/09/2016 19:55 09/11/2016 18:59 Full Code [id number]  Altamese DillingVachhani, Vaibhavkumar, MD Inpatient   06/01/2016 19:26 06/02/2016 18:35 Full Code [id number]  Ramonita LabGouru, Lakira Ogando, MD Inpatient     Disposition Plan: To be determined  Consultants:  Nephrology  Critical care time spent: 36 minutes  Plan of care discussed in detail with the patient and voicemail left to his brother [place] to call back regarding update  Patient verbalized understanding of the plan Ramonita LabAruna Nayla Dias  [employer]

## 2017-08-07 NOTE — Care Management (Signed)
RNCM received patient from [facility]/med-surg. Patient does not have health insurance. It appears [facility] over the last 6 months. He is diabetic and per last CM note, living [place] [phone]. He told the CM that he is a patient of [facility] and that they help him with his medications. I am making a referral to [facility]Medication [facility] however they are closed on the weekend.

## 2017-08-07 NOTE — Progress Notes (Signed)
*  PRELIMINARY RESULTS* Echocardiogram 2D Echocardiogram has been performed.  [name], [name] 08/07/2017, 3:32 PM

## 2017-08-07 NOTE — Progress Notes (Signed)
Pt weaned off bipap and placed on 55% venti mask, pt still has shortness of breath and breath sounds are very coarse, sats decreased to 87%, will place on non rebreather. Sats increased to  95% with non rebreather. Pt still has increase work of breathing. RN notified. MD paged.

## 2017-08-07 NOTE — Progress Notes (Signed)
Pt placed on bipap for shortness of breath per dr Amado Coegouru. Shortness of breath improved and sats inceased to 96%

## 2017-08-07 NOTE — Plan of Care (Signed)
Pt has been struggling with breathing so far this shift.  Coarse breath sounds.  Was placed on venti mask and was satting 86-87%.  Was placed on non-rebreather and O2 sats have improved to 95% but pt is lethargic and fatigued.  Also gave 80 mg lasix at 10:30 this am.  Pt has not significantly improved.  Respiratory is recommending to send him to step down.  Notifying Dr. Amado CoeGouru.

## 2017-08-07 NOTE — Progress Notes (Signed)
[facility]  ROUNDING NOTE   Subjective:   Moved to ICU  Placed on Bipap   Objective:  Vital signs in last 24 hours:  Temp:  [98.3 F (36.8 C)-99.2 F (37.3 C)] 98.3 F (36.8 C) (01/31 1328) Pulse Rate:  [95-105] 95 (01/31 1328) Resp:  [18-20] 18 (01/31 0509) BP: (149-174)/(72-95) 166/82 (01/31 1328) SpO2:  [90 %-100 %] 100 % (01/31 1328) FiO2 (%):  [55 %] 55 % (01/31 1006) Weight:  [110.1 kg (242 lb 11.2 oz)] 110.1 kg (242 lb 11.2 oz) (01/31 0509)  Weight change: 2.488 kg (5 lb 7.8 oz) Filed Weights   08/05/17 0513 08/06/17 0526 08/07/17 0509  Weight: 107.2 kg (236 lb 6.4 oz) 107.6 kg (237 lb 3.4 oz) 110.1 kg (242 lb 11.2 oz)    Intake/Output: I/O last 3 completed shifts: In: 981.5 [P.O.:510; I.V.:27.5; Blood:350; IV Piggyback:94] Out: 2155 [Urine:2155]   Intake/Output this shift:  Total I/O In: -  Out: 150 [Urine:150]  Physical Exam: General: No acute distress  Head: Normocephalic, atraumatic. Moist oral mucosal membranes  Eyes: Anicteric  Neck: Supple, trachea midline  Lungs:  Crackles bilaterally  Heart: S1S2 no rubs  Abdomen:  Soft, nontender, bowel sounds present  Extremities: No edema, Right AKA  Neurologic: Awake, alert, following commands  Skin: Venous stasis changes LLE       Basic Metabolic Panel: Recent Labs  Lab 08/01/17 0357  08/02/17 0154 08/02/17 1805 08/03/17 0453 08/03/17 2015 08/04/17 0447 08/05/17 0557 08/06/17 0726 08/07/17 0230  NA 134*  --  135  --  135 134* 134* 133* 132* 133*  K 3.9   < > 4.0 4.1 3.7 3.8 3.6 4.2 3.7 3.9  CL 102  --  102  --  102 102 102 100* 98* 99*  CO2 27  --  26  --  26 25 26 24 22 24   GLUCOSE 188*  --  207*  --  97 204* 70 180* 215* 227*  BUN 24*  --  26*  --  28* 29* 29* 37* 53* 56*  CREATININE 1.32*  --  1.74*  --  2.04* 2.40* 2.53* 3.01* 3.56* 3.54*  CALCIUM 8.2*  --  7.9*  --  8.1* 7.6* 7.7* 7.9* 7.9* 7.9*  MG 1.9   < > 1.8 2.1 2.0  --  2.2  --   --  2.4  PHOS 3.4  --   --   --    --   --   --   --  5.4*  --    < > = values in this interval not displayed.    Liver Function Tests: Recent Labs  Lab 08/03/17 2015 08/06/17 0726  AST 65*  --   ALT 43  --   ALKPHOS 80  --   BILITOT 0.6  --   PROT 5.3*  --   ALBUMIN 2.2* 2.4*   No results for input(s): LIPASE, AMYLASE in the last 168 hours. No results for input(s): AMMONIA in the last 168 hours.  CBC: Recent Labs  Lab 08/01/17 0357 08/05/17 0557 08/06/17 0726 08/07/17 0230 08/07/17 1518  WBC 5.5 10.0 12.8* 12.3* 12.3*  HGB 7.2* 6.8* 7.0* 8.5* 8.6*  HCT 21.7* 20.6* 21.4* 25.5* 26.5*  MCV 79.7* 79.9* 80.3 80.3 80.7  PLT 285 309 370 374 408    Cardiac Enzymes: Recent Labs  Lab 08/04/17 1157 08/04/17 1758 08/04/17 2333 08/05/17 0559 08/05/17 1200  TROPONINI 0.07* 0.09* 0.07* 0.09* 0.08*    BNP: Invalid  input(s): POCBNP  CBG: Recent Labs  Lab 08/06/17 1646 08/06/17 2044 08/07/17 0737 08/07/17 1210 08/07/17 1449  GLUCAP 166* 146* 167* 73 73    Microbiology: Results for orders placed or performed during the hospital encounter of 07/29/17  CULTURE, BLOOD (ROUTINE X 2) w Reflex to ID Panel     Status: None   Collection Time: 08/01/17  5:54 PM  Result Value Ref Range Status   Specimen Description BLOOD LEFT ANTECUBITAL  Final   Special Requests   Final    BOTTLES DRAWN AEROBIC AND ANAEROBIC Blood Culture results may not be optimal due to an inadequate volume of blood received in culture bottles   Culture   Final    NO GROWTH 5 DAYS Performed at [facility], [address] [place], [place] [address]    Report Status 08/06/2017 FINAL  Final  CULTURE, BLOOD (ROUTINE X 2) w Reflex to ID Panel     Status: None   Collection Time: 08/01/17  6:03 PM  Result Value Ref Range Status   Specimen Description BLOOD BLOOD RIGHT HAND  Final   Special Requests   Final    BOTTLES DRAWN AEROBIC AND ANAEROBIC Blood Culture adequate volume   Culture   Final    NO GROWTH 5 DAYS Performed at  [facility], [address] [place], [place] [address]    Report Status 08/06/2017 FINAL  Final  MRSA PCR Screening     Status: None   Collection Time: 08/04/17  3:48 PM  Result Value Ref Range Status   MRSA by PCR NEGATIVE NEGATIVE Final    Comment:        The GeneXpert MRSA Assay (FDA approved for NASAL specimens only), is one component of a comprehensive MRSA colonization surveillance program. It is not intended to diagnose MRSA infection nor to guide or monitor treatment for MRSA infections. Performed at [facility], [address] [place], [place] [address]     Coagulation Studies: No results for input(s): LABPROT, INR in the last 72 hours.  Urinalysis: No results for input(s): COLORURINE, LABSPEC, PHURINE, GLUCOSEU, HGBUR, BILIRUBINUR, KETONESUR, PROTEINUR, UROBILINOGEN, NITRITE, LEUKOCYTESUR in the last 72 hours.  Invalid input(s): APPERANCEUR    Imaging: Dg Chest Port 1 View  Result Date: 08/07/2017 CLINICAL DATA:  Shortness of breath. EXAM: PORTABLE CHEST 1 VIEW COMPARISON:  08/03/2017. FINDINGS: Cardiomegaly. Diffuse progressive bilateral pulmonary infiltrates/edema. Findings are consistent with progressive CHF and/or pneumonia. No pleural effusion or pneumothorax. IMPRESSION: Cardiomegaly with diffuse progressive bilateral pulmonary infiltrates/edema. Findings are consistent with progressive CHF and/or pneumonia. Electronically Signed   By: Maisie Fus  Register   On: 08/07/2017 10:43     Medications:   . sodium chloride Stopped (08/05/17 1730)  . furosemide (LASIX) infusion 8 mg/hr (08/07/17 1558)  . piperacillin-tazobactam (ZOSYN)  IV     . amLODipine  5 mg Oral Daily  . aspirin EC  81 mg Oral Daily  . atorvastatin  40 mg Oral q1800  . budesonide (PULMICORT) nebulizer solution  0.25 mg Nebulization Q12H  . carvedilol  12.5 mg Oral BID WC  . famotidine  20 mg Oral BID  . FLUoxetine  40 mg Oral Daily  . furosemide  40 mg Intravenous Once   . heparin injection (subcutaneous)  5,000 Units Subcutaneous Q8H  . hydrALAZINE  50 mg Oral Q8H  . insulin aspart  0-20 Units Subcutaneous Q4H  . insulin detemir  25 Units Subcutaneous QHS  . ipratropium-albuterol  3 mL Nebulization Q4H  .  magnesium oxide  400 mg Oral BID  . mouth rinse  15 mL Mouth Rinse BID  . methylPREDNISolone (SOLU-MEDROL) injection  40 mg Intravenous Q12H  . sodium chloride flush  10-40 mL Intracatheter Q12H   acetaminophen, alum & mag hydroxide-simeth, docusate sodium, morphine injection, sodium chloride flush  Assessment/ Plan:  32 y.o. black male with Diabetes mellitus type 1, hypertension, coronary artery disease, peripheral vascular disease status post right above-the-knee amputation, hyperlipidemiawho [facility] of severe anasarca.   1. 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  Negative serologic work up.  - Acute renal failure from contrast exposure. Creatinine continues to trend up.  - Low threshold for dialysis - Place on furosemide gtt.   2. Hypertension: with anasarca/edema: History of angioedema with lisinopril.  - discontinued IV albumin  - furosemide gtt.   - amlodipine, hydralazine, and carvedilol  3. Acute respiratory failure: from pulmonary edema. Status post 2 units PRBC and IV albumin and renal failure and pneumonia.  - noninvasive ventilation.  - empiric antibiotics.   4. Diabetes mellitus type I with chronic kidney disease: poor control. Hemoglobin A1c of 14.1% on 06/10/17.  - Continue glucose control.   5. Anemia with kidney failure: status post PRBC transfusion.    LOS: 9 [name] 1/31/20194:07 PM

## 2017-08-07 NOTE — Progress Notes (Signed)
PT Cancellation Note  Patient Details Name: [name] MRN: [id number] DOB: 02/21/1986   Cancelled Treatment:    Reason Eval/Treat Not Completed: Medical issues which prohibited therapy. Treatment attempted, however pt just placed on venti mask. Pt reports he is unable to participate and wishes PT to re attempt in PM.   Ross Hefferan 08/07/2017, 11:43 AM  Elizabeth PalauStephanie Latron Ribas, PT, DPT [phone]

## 2017-08-07 NOTE — Progress Notes (Signed)
PT Cancellation Note  Patient Details Name: [name] MRN: [id number] DOB: 09/19/1985   Cancelled Treatment:    Reason Eval/Treat Not Completed: Medical issues which prohibited therapy   Chart reviewed and discussed with nurse.  Pt awaiting transfer to step-down unit.  Will discharge therapy at time.  Will await new orders.     Danielle DessSarah Ulla Mckiernan 08/07/2017, 2:17 PM

## 2017-08-07 NOTE — Progress Notes (Addendum)
ELECTROLYTE CONSULT NOTE   Pharmacy Consult for electrolyte monitoring and replacement  Allergies  Allergen Reactions  . Lisinopril Swelling   Patient Measurements: Height: 5\' 6"  (167.6 cm) Weight: 242 lb 11.2 oz (110.1 kg) IBW/kg (Calculated) : 63.8  Vital Signs: Temp: 98.3 F (36.8 C) (01/31 0509) Temp Source: Oral (01/31 0509) BP: 174/95 (01/31 0509) Pulse Rate: 103 (01/31 0509) Intake/Output from previous day: 01/30 0701 - 01/31 0700 In: 887.5 [P.O.:510; I.V.:27.5; Blood:350] Out: 2155 [Urine:2155] Intake/Output from this shift: No intake/output data recorded.  Labs: Recent Labs    08/05/17 0557 08/06/17 0726 08/07/17 0230  WBC 10.0 12.8* 12.3*  HGB 6.8* 7.0* 8.5*  HCT 20.6* 21.4* 25.5*  PLT 309 370 374  CREATININE 3.01* 3.56* 3.54*  MG  --   --  2.4  PHOS  --  5.4*  --   ALBUMIN  --  2.4*  --    Estimated Creatinine Clearance: 34.5 mL/min (A) (by C-G formula based on SCr of 3.54 mg/dL (H)).  Assessment: Pharmacy consulted to assist with electrolyte monitoring and replacement.  Goal of Therapy: Electrolytes WNL  Plan:  1/28 Electrolytes WNL. No replacement needed.   Gardner CandleSheema M Vertie Dibbern, PharmD, BCPS Clinical Pharmacist 08/07/2017,7:09 AM

## 2017-08-07 NOTE — Consult Note (Signed)
PULMONARY / CRITICAL CARE MEDICINE   Name: [name] MRN: [id number] DOB: [birth year]    ADMISSION DATE:  07/29/2017   CONSULTATION DATE:  08/07/2017  REFERRING MD:  Dr. Amado Coe  REASON: Acute hypoxic respiratory failure  HISTORY OF PRESENT ILLNESS:   This is a 32 y/o AA male with a PMH as indicated below who was admitted with acute renal failure, anasarca, uncontrolled diabetes, hypoalbuminemia, HCAP and acute CHF exacerbation. He was started on a furosemide infusion, antibiotics and albumin. This afternoon, patient became acutely hypoxic. His CXR showed worsening [ulmonary edema and bilateral pleural effusions. He is being transferred to the ICU for further management.   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), Acute gastroenteritis (09/11/2016), AKI (acute kidney injury) (HCC) (02/19/2013), Anemia (12/14/2015), Dehydration (09/11/2016), Diabetes mellitus without complication (HCC), Diabetic acidosis without coma (HCC) (06/01/2016), Elevated bilirubin (09/11/2016), Essential hypertension with goal blood pressure less than 130/80 (02/07/2015), Hypercholesterolemia (02/25/2013), Hyponatremia (09/11/2016), Hypophosphatemia (12/14/2015), Hypovitaminosis D (12/16/2015), Mass of right inguinal region, Microcytic anemia (11/17/2014), Non-ST elevation myocardial infarction (NSTEMI) (HCC) (02/19/2013), Poorly controlled type 1 diabetes mellitus (HCC) (03/03/2012), and Proteinuria (12/14/2015).  PAST SURGICAL HISTORY: He  has a past surgical history that includes Leg amputation above knee (Right).  Allergies  Allergen Reactions  . Lisinopril Swelling    No current facility-administered medications on file prior to encounter.    Current Outpatient Medications on File Prior to Encounter  Medication Sig  . FLUoxetine (PROZAC) 40 MG capsule Take 40 mg by mouth daily.  . furosemide (LASIX) 40 MG  tablet Take 1 tablet (40 mg total) by mouth 2 (two) times daily.  . insulin detemir (LEVEMIR) 100 UNIT/ML injection Inject 30 Units into the skin at bedtime.  Marland Kitchen aspirin EC 81 MG tablet Take 1 tablet (81 mg total) by mouth daily.  Marland Kitchen atorvastatin (LIPITOR) 40 MG tablet Take 1 tablet (40 mg total) by mouth daily at 6 PM. (Patient not taking: Reported on 07/29/2017)  . hydrALAZINE (APRESOLINE) 25 MG tablet Take 1 tablet (25 mg total) by mouth every 8 (eight) hours. (Patient not taking: Reported on 07/29/2017)  . insulin aspart (NOVOLOG) 100 UNIT/ML injection Inject 5 Units into the skin 3 (three) times daily before meals. (Patient taking differently: Inject 10 Units into the skin 3 (three) times daily with meals. )  . potassium chloride SA (K-DUR,KLOR-CON) 20 MEQ tablet Take 2 tablets (40 mEq total) by mouth 2 (two) times daily. (Patient not taking: Reported on 07/29/2017)    FAMILY HISTORY:  His indicated that the status of his mother is unknown.   SOCIAL HISTORY: He  reports that  has never smoked. he has never used smokeless tobacco. He reports that he does not drink alcohol or use drugs.  REVIEW OF SYSTEMS:   Unable to obtain as patient is on continuous BiPAP  SUBJECTIVE:  Awakens to voice  VITAL SIGNS: BP (!) 166/82 (BP Location: Right Arm)   Pulse 95   Temp 98.3 F (36.8 C) (Oral)   Resp 18   Ht 5\' 6"  (1.676 m)   Wt 110.1 kg (242 lb 11.2 oz)   SpO2 100%   BMI 39.17 kg/m   HEMODYNAMICS:    VENTILATOR SETTINGS: FiO2 (%):  [55 %] 55 %  INTAKE / OUTPUT: I/O last 3 completed shifts: In: 981.5 [P.O.:510; I.V.:27.5; Blood:350; IV Piggyback:94] Out: 2155 [Urine:2155]  PHYSICAL EXAMINATION: General: Moderate  respiratory distress; generalized edema  Neuro: Awakens to voice, follows some basic commands, somnolent HEENT: PERRLA, trachea midline, moderate JVD Cardiovascular: RRR, S1/S2, no MRG, +2 pulses, +3 edema Lungs:  Increased WOB, bilateral breath sounds with diffuse  bibasilar crackles, no wheezing Abdomen:  Obese, normal bowel sounds Musculoskeletal:  Right AKA, no joint swelling Skin:  Warm and dry, significant venous stasis discoloration in bilateral lower extremities  LABS:  BMET Recent Labs  Lab 08/05/17 0557 08/06/17 0726 08/07/17 0230  NA 133* 132* 133*  K 4.2 3.7 3.9  CL 100* 98* 99*  CO2 24 22 24   BUN 37* 53* 56*  CREATININE 3.01* 3.56* 3.54*  GLUCOSE 180* 215* 227*    Electrolytes Recent Labs  Lab 08/01/17 0357  08/03/17 0453  08/04/17 0447 08/05/17 0557 08/06/17 0726 08/07/17 0230  CALCIUM 8.2*   < > 8.1*   < > 7.7* 7.9* 7.9* 7.9*  MG 1.9   < > 2.0  --  2.2  --   --  2.4  PHOS 3.4  --   --   --   --   --  5.4*  --    < > = values in this interval not displayed.    CBC Recent Labs  Lab 08/05/17 0557 08/06/17 0726 08/07/17 0230  WBC 10.0 12.8* 12.3*  HGB 6.8* 7.0* 8.5*  HCT 20.6* 21.4* 25.5*  PLT 309 370 374    Coag's No results for input(s): APTT, INR in the last 168 hours.  Sepsis Markers Recent Labs  Lab 08/02/17 1243 08/02/17 1530 08/03/17 0453 08/04/17 0447 08/07/17 0230  LATICACIDVEN 1.2 1.1  --   --   --   PROCALCITON 1.78  --  1.73 1.56 1.44    ABG Recent Labs  Lab 08/07/17 1345  PHART 7.46*  PCO2ART 38  PO2ART 66*    Liver Enzymes Recent Labs  Lab 08/03/17 2015 08/06/17 0726  AST 65*  --   ALT 43  --   ALKPHOS 80  --   BILITOT 0.6  --   ALBUMIN 2.2* 2.4*    Cardiac Enzymes Recent Labs  Lab 08/04/17 2333 08/05/17 0559 08/05/17 1200  TROPONINI 0.07* 0.09* 0.08*    Glucose Recent Labs  Lab 08/06/17 1141 08/06/17 1646 08/06/17 2044 08/07/17 0737 08/07/17 1210 08/07/17 1449  GLUCAP 134* 166* 146* 167* 73 73    Imaging Dg Chest Port 1 View  Result Date: 08/07/2017 CLINICAL DATA:  Shortness of breath. EXAM: PORTABLE CHEST 1 VIEW COMPARISON:  08/03/2017. FINDINGS: Cardiomegaly. Diffuse progressive bilateral pulmonary infiltrates/edema. Findings are consistent  with progressive CHF and/or pneumonia. No pleural effusion or pneumothorax. IMPRESSION: Cardiomegaly with diffuse progressive bilateral pulmonary infiltrates/edema. Findings are consistent with progressive CHF and/or pneumonia. Electronically Signed   By: Maisie Fushomas  Register   On: 08/07/2017 10:43   STUDIES:  2-D echo>  CULTURES: None  ANTIBIOTICS: Cefepime 1/31> Augmentin 1/22>1/31  SIGNIFICANT EVENTS: 1/22>admitted 1/31>Transferred to the ICU  LINES/TUBES: PIVs Foley if needed  DISCUSSION: This is a 32 y/o AA male admitted with worsening renal failure, anasarca , acute CHF exacerbation and HCAP; now with acute hypoxic respiratory failure requiring BiPAP. Patient is at increased risk for intubation  ASSESSMENT Acute hypoxic respiratory failure Acute CHF exacerbation Anasarca Acute renal failure-Creatinine trending up 2.4>3.5 Uncontrolled type 1DM Uncontrolled hypertension [facility] acquired pneumonia   PLAN Continuous BiPAP and transition to nasal cannula as tolerated Chest x-ray and ABG as needed Furosemide infusion per nephrology Monitor and correct electrolytes Blood glucose monitoring with  sliding scale insulin coverage Trend creatinine Hemodynamics per ICU protocol Continue current antihypertensives 2D echo Continue empiric antibiotics GI and DVT prophylaxis-SQ heparin and famotidine Patient is at high risk for intubation. Proceed with intubation if no improvement on BiPAP  FAMILY  - Updates: Dr. Belia Heman spoke with patient's Godmother. She will be in to see the patient.   - Inter-disciplinary family meet or Palliative Care meeting due by:  day 7   Camilia Caywood S. [facility] ANP-BC Pulmonary and Critical Care Medicine [facility] Pager [phone] or [phone]  NB: This document was prepared using Dragon voice recognition software and may include unintentional dictation errors.    08/07/2017, 3:10 PM

## 2017-08-08 ENCOUNTER — Inpatient Hospital Stay: Payer: Medicare Other

## 2017-08-08 ENCOUNTER — Telehealth: Payer: Self-pay

## 2017-08-08 LAB — BASIC METABOLIC PANEL
Anion gap: 11 (ref 5–15)
BUN: 60 mg/dL — AB (ref 6–20)
CALCIUM: 7.9 mg/dL — AB (ref 8.9–10.3)
CO2: 23 mmol/L (ref 22–32)
CREATININE: 3.43 mg/dL — AB (ref 0.61–1.24)
Chloride: 100 mmol/L — ABNORMAL LOW (ref 101–111)
GFR calc Af Amer: 25 mL/min — ABNORMAL LOW (ref >60)
GFR, EST NON AFRICAN AMERICAN: 22 mL/min — AB (ref >60)
GLUCOSE: 219 mg/dL — AB (ref 65–99)
Potassium: 3.9 mmol/L (ref 3.5–5.1)
Sodium: 134 mmol/L — ABNORMAL LOW (ref 135–145)

## 2017-08-08 LAB — GLUCOSE, CAPILLARY
GLUCOSE-CAPILLARY: 175 mg/dL — AB (ref 65–99)
GLUCOSE-CAPILLARY: 228 mg/dL — AB (ref 65–99)
Glucose-Capillary: 209 mg/dL — ABNORMAL HIGH (ref 65–99)
Glucose-Capillary: 164 mg/dL — ABNORMAL HIGH (ref 65–99)
Glucose-Capillary: 212 mg/dL — ABNORMAL HIGH (ref 65–99)
Glucose-Capillary: 224 mg/dL — ABNORMAL HIGH (ref 65–99)

## 2017-08-08 LAB — TROPONIN I: Troponin I: 0.05 ng/mL (ref <0.03)

## 2017-08-08 MED ORDER — IPRATROPIUM-ALBUTEROL 0.5-2.5 (3) MG/3ML IN SOLN
3.0000 mL | RESPIRATORY_TRACT | Status: DC | PRN
  Administered 2017-08-08: 3 mL via RESPIRATORY_TRACT
  Filled 2017-08-08: qty 3

## 2017-08-08 MED ORDER — INSULIN ASPART 100 UNIT/ML ~~LOC~~ SOLN
0.0000 [IU] | SUBCUTANEOUS | Status: DC
  Administered 2017-08-08: 5 [IU] via SUBCUTANEOUS
  Administered 2017-08-08: 3 [IU] via SUBCUTANEOUS
  Administered 2017-08-08: 5 [IU] via SUBCUTANEOUS
  Administered 2017-08-08 – 2017-08-09 (×2): 3 [IU] via SUBCUTANEOUS
  Administered 2017-08-09: 5 [IU] via SUBCUTANEOUS
  Administered 2017-08-09: 3 [IU] via SUBCUTANEOUS
  Administered 2017-08-09: 8 [IU] via SUBCUTANEOUS
  Administered 2017-08-09: 3 [IU] via SUBCUTANEOUS
  Administered 2017-08-10: 11 [IU] via SUBCUTANEOUS
  Administered 2017-08-10: 5 [IU] via SUBCUTANEOUS
  Administered 2017-08-10: 2 [IU] via SUBCUTANEOUS
  Administered 2017-08-10: 3 [IU] via SUBCUTANEOUS
  Administered 2017-08-10: 5 [IU] via SUBCUTANEOUS
  Administered 2017-08-10: 3 [IU] via SUBCUTANEOUS
  Administered 2017-08-11: 8 [IU] via SUBCUTANEOUS
  Administered 2017-08-11: 11 [IU] via SUBCUTANEOUS
  Administered 2017-08-11: 3 [IU] via SUBCUTANEOUS
  Administered 2017-08-11 (×3): 5 [IU] via SUBCUTANEOUS
  Administered 2017-08-12: 2 [IU] via SUBCUTANEOUS
  Administered 2017-08-12: 3 [IU] via SUBCUTANEOUS
  Administered 2017-08-12: 5 [IU] via SUBCUTANEOUS
  Administered 2017-08-12: 8 [IU] via SUBCUTANEOUS
  Administered 2017-08-12: 3 [IU] via SUBCUTANEOUS
  Administered 2017-08-12: 5 [IU] via SUBCUTANEOUS
  Administered 2017-08-13: 11 [IU] via SUBCUTANEOUS
  Administered 2017-08-13: 5 [IU] via SUBCUTANEOUS
  Administered 2017-08-13 (×2): 8 [IU] via SUBCUTANEOUS
  Administered 2017-08-13: 2 [IU] via SUBCUTANEOUS
  Administered 2017-08-13: 15 [IU] via SUBCUTANEOUS
  Administered 2017-08-14: 5 [IU] via SUBCUTANEOUS
  Administered 2017-08-14 (×2): 3 [IU] via SUBCUTANEOUS
  Filled 2017-08-08 (×37): qty 1

## 2017-08-08 NOTE — Consult Note (Signed)
[facility] Consult   Reason for Consult: Follow-up consult 32 year old man with multiple severe medical problems Referring Physician: Gouru Patient Identification: May Ozment MRN:  [id number] Principal Diagnosis: Adjustment disorder with mixed anxiety and depressed mood Diagnosis:   Patient Active Problem List   Diagnosis Date Noted  . Adjustment disorder with mixed anxiety and depressed mood [F43.23] 08/06/2017  . Anasarca [R60.1] 04/14/2017  . Dehydration [E86.0] 09/11/2016  . Elevated bilirubin [R17] 09/11/2016  . Acute gastroenteritis [K52.9] 09/11/2016  . Abscess of groin, right [L02.214] 09/09/2016  . Diabetic acidosis without coma ([place]) [E13.10] 06/01/2016  . Hypovitaminosis D [E55.9] 12/16/2015  . Anemia [D64.9] 12/14/2015  . Hypophosphatemia [E83.39] 12/14/2015  . 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 Essential hypertension with goal blood pressure less than 130/80 [I10] 02/07/2015  . Microcytic anemia [D50.9] 11/17/2014  . Hypercholesterolemia [E78.00] 02/25/2013  . AKI (acute kidney injury) ([place]) [N17.9] 02/19/2013  . Non-ST elevation myocardial infarction (NSTEMI) ([place]) [I21.4] 02/19/2013  . Poorly controlled type 1 diabetes mellitus ([place]) [E10.65] 03/03/2012    Total Time spent with patient: 15 minutes  Subjective:   Mykah Schalk is a 32 y.o. male patient admitted with patient was admitted with anxiety but severe medical problems related to his diabetes.  Patient seen chart reviewed.  Patient says he is feeling better today.  He certainly looks better compared to yesterday when he was in  HPI: Clear respiratory distress.  He has calm down today.  Denies suicidal thoughts does not feel depressed anxiety is improved.  Past Psychiatric History: Past history of situational anxiety  Risk to Self: Is patient at risk for suicide?: No Risk to Others:   Prior Inpatient Therapy:   Prior Outpatient Therapy:     Past Medical History:  Past Medical History:  Diagnosis Date  . Abscess of groin, right 09/09/2016  . 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 Acute gastroenteritis 09/11/2016  . AKI (acute kidney injury) ([place]) 02/19/2013  . Anemia 12/14/2015  . Dehydration 09/11/2016  . Diabetes mellitus without complication ([place])   . Diabetic acidosis without coma ([place]) 06/01/2016  . Elevated bilirubin 09/11/2016  . Essential hypertension with goal blood pressure less than 130/80 02/07/2015  . Hypercholesterolemia 02/25/2013  . Hyponatremia 09/11/2016  . Hypophosphatemia 12/14/2015  . Hypovitaminosis D 12/16/2015  . Mass of right inguinal region   . Microcytic anemia 11/17/2014  . Non-ST elevation myocardial infarction (NSTEMI) ([place]) 02/19/2013  . Poorly controlled type 1 diabetes mellitus ([place]) 03/03/2012   Overview:  Diagnosed 02/2012.  Now insulin-dependent  . Proteinuria 12/14/2015    Past Surgical History:  Procedure Laterality Date  . LEG AMPUTATION ABOVE KNEE Right    Family History:  Family History  Problem Relation Age of Onset  . Diabetes Mother    Family Psychiatric  History: None Social History:  Social History   Substance and Sexual Activity  Alcohol Use No     Social History   Substance and Sexual Activity  Drug Use No    Social History   Socioeconomic History  . Marital status: Single    Spouse name: None  . Number of children: None  . Years of education: None  . Highest education level: None  Social Needs  . Financial resource strain: None  . Food insecurity - worry: None  . Food insecurity - inability: None  . Transportation needs - medical: None  . Transportation needs - non-medical: None  Occupational History  . None  Tobacco Use  . Smoking status: Never Smoker  . Smokeless tobacco: Never Used  Substance and Sexual Activity  . Alcohol use: No  . Drug use: No  . Sexual activity: None  Other Topics Concern  . None  Social History  Narrative  . None   Additional Social History:    Allergies:   Allergies  Allergen Reactions  . Lisinopril Swelling    Labs:  Results for orders placed or performed during the hospital encounter of 07/29/17 (from the past 48 hour(s))  Glucose, capillary     Status: Abnormal   Collection Time: 08/06/17  8:44 PM  Result Value Ref Range   Glucose-Capillary 146 (H) 65 - 99 mg/dL  Basic metabolic panel     Status: Abnormal   Collection Time: 08/07/17  2:30 AM  Result Value Ref Range   Sodium 133 (L) 135 - 145 mmol/L   Potassium 3.9 3.5 - 5.1 mmol/L   Chloride 99 (L) 101 - 111 mmol/L   CO2 24 22 - 32 mmol/L   Glucose, Bld 227 (H) 65 - 99 mg/dL   BUN 56 (H) 6 - 20 mg/dL   Creatinine, Ser 3.54 (H) 0.61 - 1.24 mg/dL   Calcium 7.9 (L) 8.9 - 10.3 mg/dL   GFR calc non Af Amer 21 (L) >60 mL/min   GFR calc Af Amer 24 (L) >60 mL/min    Comment: (NOTE) The eGFR has been calculated using the CKD EPI equation. This calculation has not been validated in all clinical situations. eGFR's persistently <60 mL/min signify possible Chronic Kidney Disease.    Anion gap 10 5 - 15    Comment: Performed at [facility], [address] [place], [place] [address]  Magnesium     Status: None   Collection Time: 08/07/17  2:30 AM  Result Value Ref Range   Magnesium 2.4 1.7 - 2.4 mg/dL    Comment: Performed at [facility], [address] [place], [place] [address]  CBC     Status: Abnormal   Collection Time: 08/07/17  2:30 AM  Result Value Ref Range   WBC 12.3 (H) 3.8 - 10.6 K/uL   RBC 3.18 (L) 4.40 - 5.90 MIL/uL   Hemoglobin 8.5 (L) 13.0 - 18.0 g/dL   HCT 25.5 (L) 40.0 - 52.0 %   MCV 80.3 80.0 - 100.0 fL   MCH 26.8 26.0 - 34.0 pg   MCHC 33.4 32.0 - 36.0 g/dL   RDW 15.2 (H) 11.5 - 14.5 %   Platelets 374 150 - 440 K/uL    Comment: Performed at [facility], [address] [place], [place] [address]  Brain natriuretic peptide     Status: Abnormal   Collection  Time: 08/07/17  2:30 AM  Result Value Ref Range   B Natriuretic Peptide 398.0 (H) 0.0 - 100.0 pg/mL    Comment: Performed at [facility], [address] [place], [place] [address]  Procalcitonin - Baseline     Status: None   Collection Time: 08/07/17  2:30 AM  Result Value Ref Range   Procalcitonin 1.44 ng/mL    Comment:        Interpretation: PCT > 0.5 ng/mL and <= 2 ng/mL: Systemic infection (sepsis) is possible, but other conditions are known to elevate PCT as well. (NOTE)       Sepsis PCT Algorithm           Lower Respiratory Tract  Infection PCT Algorithm    ----------------------------     ----------------------------         PCT < 0.25 ng/mL                PCT < 0.10 ng/mL         Strongly encourage             Strongly discourage   discontinuation of antibiotics    initiation of antibiotics    ----------------------------     -----------------------------       PCT 0.25 - 0.50 ng/mL            PCT 0.10 - 0.25 ng/mL               OR       >80% decrease in PCT            Discourage initiation of                                            antibiotics      Encourage discontinuation           of antibiotics    ----------------------------     -----------------------------         PCT >= 0.50 ng/mL              PCT 0.26 - 0.50 ng/mL                AND       <80% decrease in PCT             Encourage initiation of                                             antibiotics       Encourage continuation           of antibiotics    ----------------------------     -----------------------------        PCT >= 0.50 ng/mL                  PCT > 0.50 ng/mL               AND         increase in PCT                  Strongly encourage                                      initiation of antibiotics    Strongly encourage escalation           of antibiotics                                     -----------------------------                                            PCT <= 0.25 ng/mL  OR                                        > 80% decrease in PCT                                     Discontinue / Do not initiate                                             antibiotics Performed at [facility], [address] [place], [place] [address]   Glucose, capillary     Status: Abnormal   Collection Time: 08/07/17  7:37 AM  Result Value Ref Range   Glucose-Capillary 167 (H) 65 - 99 mg/dL  Glucose, capillary     Status: None   Collection Time: 08/07/17 12:10 PM  Result Value Ref Range   Glucose-Capillary 73 65 - 99 mg/dL  Blood gas, arterial     Status: Abnormal   Collection Time: 08/07/17  1:45 PM  Result Value Ref Range   FIO2 0.55    Delivery systems BILEVEL POSITIVE AIRWAY PRESSURE    pH, Arterial 7.46 (H) 7.350 - 7.450   pCO2 arterial 38 32.0 - 48.0 mmHg   pO2, Arterial 66 (L) 83.0 - 108.0 mmHg   Bicarbonate 27.0 20.0 - 28.0 mmol/L   Acid-Base Excess 3.0 (H) 0.0 - 2.0 mmol/L   O2 Saturation 93.6 %   Patient temperature 37.2    Collection site RIGHT RADIAL    Sample type ARTERIAL DRAW    Allens test (pass/fail) PASS PASS    Comment: Performed at [facility], [address] [place], [place] [address]  Glucose, capillary     Status: None   Collection Time: 08/07/17  2:49 PM  Result Value Ref Range   Glucose-Capillary 73 65 - 99 mg/dL  Lactic acid, plasma     Status: None   Collection Time: 08/07/17  3:18 PM  Result Value Ref Range   Lactic Acid, Venous 0.7 0.5 - 1.9 mmol/L    Comment: Performed at [facility], [address] [place], [place] [address]  Troponin I     Status: Abnormal   Collection Time: 08/07/17  3:18 PM  Result Value Ref Range   Troponin I 0.05 (HH) <0.03 ng/mL    Comment: CRITICAL VALUE NOTED. VALUE IS CONSISTENT WITH PREVIOUSLY REPORTED/CALLED VALUE BY CAF Performed at [facility], [address]  [place], [place] [address]   CBC     Status: Abnormal   Collection Time: 08/07/17  3:18 PM  Result Value Ref Range   WBC 12.3 (H) 3.8 - 10.6 K/uL   RBC 3.28 (L) 4.40 - 5.90 MIL/uL   Hemoglobin 8.6 (L) 13.0 - 18.0 g/dL   HCT 26.5 (L) 40.0 - 52.0 %   MCV 80.7 80.0 - 100.0 fL   MCH 26.3 26.0 - 34.0 pg   MCHC 32.6 32.0 - 36.0 g/dL   RDW 15.3 (H) 11.5 - 14.5 %   Platelets 408 150 - 440 K/uL    Comment: Performed at [facility], [address], [place], [place] [address]  Comprehensive metabolic panel     Status: Abnormal  Collection Time: 08/07/17  3:18 PM  Result Value Ref Range   Sodium 135 135 - 145 mmol/L   Potassium 4.0 3.5 - 5.1 mmol/L   Chloride 102 101 - 111 mmol/L   CO2 23 22 - 32 mmol/L   Glucose, Bld 75 65 - 99 mg/dL   BUN 57 (H) 6 - 20 mg/dL   Creatinine, Ser 3.43 (H) 0.61 - 1.24 mg/dL   Calcium 8.3 (L) 8.9 - 10.3 mg/dL   Total Protein 5.7 (L) 6.5 - 8.1 g/dL   Albumin 2.3 (L) 3.5 - 5.0 g/dL   AST 27 15 - 41 U/L   ALT 21 17 - 63 U/L   Alkaline Phosphatase 84 38 - 126 U/L   Total Bilirubin 0.6 0.3 - 1.2 mg/dL   GFR calc non Af Amer 22 (L) >60 mL/min   GFR calc Af Amer 25 (L) >60 mL/min    Comment: (NOTE) The eGFR has been calculated using the CKD EPI equation. This calculation has not been validated in all clinical situations. eGFR's persistently <60 mL/min signify possible Chronic Kidney Disease.    Anion gap 10 5 - 15    Comment: Performed at [facility], [address] [place], [place] [address]  Magnesium     Status: Abnormal   Collection Time: 08/07/17  3:18 PM  Result Value Ref Range   Magnesium 2.5 (H) 1.7 - 2.4 mg/dL    Comment: Performed at [facility], [address] [place], [place] [address]  Phosphorus     Status: Abnormal   Collection Time: 08/07/17  3:18 PM  Result Value Ref Range   Phosphorus 5.3 (H) 2.5 - 4.6 mg/dL    Comment: Performed at [facility], [address] [place], [place] [address]   Glucose, capillary     Status: Abnormal   Collection Time: 08/07/17  5:53 PM  Result Value Ref Range   Glucose-Capillary 114 (H) 65 - 99 mg/dL  Glucose, capillary     Status: Abnormal   Collection Time: 08/07/17  7:58 PM  Result Value Ref Range   Glucose-Capillary 157 (H) 65 - 99 mg/dL  Troponin I     Status: Abnormal   Collection Time: 08/07/17  8:00 PM  Result Value Ref Range   Troponin I 0.05 (HH) <0.03 ng/mL    Comment: CRITICAL VALUE NOTED.  VALUE IS CONSISTENT WITH PREVIOUSLY REPORTED AND CALLED VALUE. BY CAF Performed at [facility], [address] [place], [place] [address]   Glucose, capillary     Status: Abnormal   Collection Time: 08/07/17 11:28 PM  Result Value Ref Range   Glucose-Capillary 226 (H) 65 - 99 mg/dL  Troponin I     Status: Abnormal   Collection Time: 08/08/17  2:09 AM  Result Value Ref Range   Troponin I 0.05 (HH) <0.03 ng/mL    Comment: CRITICAL VALUE NOTED. VALUE IS CONSISTENT WITH PREVIOUSLY REPORTED/CALLED VALUE.JAG Performed at [facility], [address] [place], [place] [address]   Basic metabolic panel     Status: Abnormal   Collection Time: 08/08/17  2:10 AM  Result Value Ref Range   Sodium 134 (L) 135 - 145 mmol/L   Potassium 3.9 3.5 - 5.1 mmol/L   Chloride 100 (L) 101 - 111 mmol/L   CO2 23 22 - 32 mmol/L   Glucose, Bld 219 (H) 65 - 99 mg/dL   BUN 60 (H) 6 - 20 mg/dL   Creatinine, Ser 3.43 (H) 0.61 -  1.24 mg/dL   Calcium 7.9 (L) 8.9 - 10.3 mg/dL   GFR calc non Af Amer 22 (L) >60 mL/min   GFR calc Af Amer 25 (L) >60 mL/min    Comment: (NOTE) The eGFR has been calculated using the CKD EPI equation. This calculation has not been validated in all clinical situations. eGFR's persistently <60 mL/min signify possible Chronic Kidney Disease.    Anion gap 11 5 - 15    Comment: Performed at [facility], [address] [place], [place] [address]  Glucose, capillary     Status: Abnormal   Collection Time:  08/08/17  3:01 AM  Result Value Ref Range   Glucose-Capillary 209 (H) 65 - 99 mg/dL  Glucose, capillary     Status: Abnormal   Collection Time: 08/08/17  7:31 AM  Result Value Ref Range   Glucose-Capillary 164 (H) 65 - 99 mg/dL  Glucose, capillary     Status: Abnormal   Collection Time: 08/08/17 11:42 AM  Result Value Ref Range   Glucose-Capillary 175 (H) 65 - 99 mg/dL  Glucose, capillary     Status: Abnormal   Collection Time: 08/08/17  4:53 PM  Result Value Ref Range   Glucose-Capillary 212 (H) 65 - 99 mg/dL    Current Facility-Administered Medications  Medication Dose Route Frequency Provider Last Rate Last Dose  . 0.9 %  sodium chloride infusion   Intravenous Once Nicholes Mango, MD   Stopped at 08/05/17 1730  . acetaminophen (TYLENOL) tablet 650 mg  650 mg Oral Q6H PRN Vaughan Basta, MD   650 mg at 08/05/17 1442  . alum & mag hydroxide-simeth (MAALOX/MYLANTA) 200-200-20 MG/5ML suspension 15 mL  15 mL Oral Q4H PRN Gouru, Aruna, MD   15 mL at 08/05/17 1112  . amLODipine (NORVASC) tablet 5 mg  5 mg Oral Daily Lateef, Munsoor, MD   5 mg at 08/08/17 1139  . aspirin EC tablet 81 mg  81 mg Oral Daily Vaughan Basta, MD   81 mg at 08/08/17 1140  . atorvastatin (LIPITOR) tablet 40 mg  40 mg Oral q1800 Vaughan Basta, MD   40 mg at 08/08/17 1815  . budesonide (PULMICORT) nebulizer solution 0.25 mg  0.25 mg Nebulization BID Gouru, Aruna, MD   0.25 mg at 08/08/17 0729  . carvedilol (COREG) tablet 12.5 mg  12.5 mg Oral BID WC Gouru, Aruna, MD   12.5 mg at 08/08/17 1622  . docusate sodium (COLACE) capsule 100 mg  100 mg Oral BID PRN Vaughan Basta, MD      . famotidine (PEPCID) tablet 20 mg  20 mg Oral BID Gouru, Aruna, MD   20 mg at 08/08/17 1139  . FLUoxetine (PROZAC) capsule 40 mg  40 mg Oral Daily Vaughan Basta, MD   40 mg at 08/08/17 1144  . furosemide (LASIX) 250 mg in dextrose 5 % 250 mL (1 mg/mL) infusion  10 mg/hr Intravenous Continuous Lafayette Dragon, MD 10 mL/hr at 08/08/17 1746 10 mg/hr at 08/08/17 1746  . heparin injection 5,000 Units  5,000 Units Subcutaneous Q8H Tukov, Magadalene S, NP   5,000 Units at 08/08/17 1446  . hydrALAZINE (APRESOLINE) tablet 50 mg  50 mg Oral Q8H Loletha Grayer, MD   50 mg at 08/08/17 1623  . insulin aspart (novoLOG) injection 0-15 Units  0-15 Units Subcutaneous Q4H Nicholes Mango, MD   3 Units at 08/08/17 1623  . insulin detemir (LEVEMIR) injection 25 Units  25 Units Subcutaneous QHS Nicholes Mango, MD  25 Units at 08/07/17 2355  . ipratropium-albuterol (DUONEB) 0.5-2.5 (3) MG/3ML nebulizer solution 3 mL  3 mL Nebulization Q4H Tukov, Magadalene S, NP   3 mL at 08/08/17 1525  . ipratropium-albuterol (DUONEB) 0.5-2.5 (3) MG/3ML nebulizer solution 3 mL  3 mL Nebulization Q4H PRN Awilda Bill, NP   3 mL at 08/08/17 0226  . magnesium oxide (MAG-OX) tablet 400 mg  400 mg Oral BID Loletha Grayer, MD   400 mg at 08/08/17 1139  . MEDLINE mouth rinse  15 mL Mouth Rinse BID Gouru, Aruna, MD   15 mL at 08/08/17 1142  . methylPREDNISolone sodium succinate (SOLU-MEDROL) 40 mg/mL injection 40 mg  40 mg Intravenous Q12H Tukov, Magadalene S, NP   40 mg at 08/08/17 1446  . morphine 2 MG/ML injection 2 mg  2 mg Intravenous Q6H PRN Henreitta Leber, MD   2 mg at 08/04/17 1104  . piperacillin-tazobactam (ZOSYN) IVPB 3.375 g  3.375 g Intravenous Q8H Kasa, Kurian, MD 12.5 mL/hr at 08/08/17 1623 3.375 g at 08/08/17 1623  . sodium chloride (OCEAN) 0.65 % nasal spray 1 spray  1 spray Each Nare PRN Nicholes Mango, MD   1 spray at 08/07/17 2110  . sodium chloride flush (NS) 0.9 % injection 10-40 mL  10-40 mL Intracatheter Q12H Gouru, Aruna, MD   10 mL at 08/08/17 1143  . sodium chloride flush (NS) 0.9 % injection 10-40 mL  10-40 mL Intracatheter PRN Nicholes Mango, MD        Musculoskeletal: Strength & Muscle Tone: within normal limits Gait & Station: unable to stand Patient leans: N/A  Psychiatric Specialty Exam: Physical  Exam  Nursing note and vitals reviewed. Constitutional: He appears well-developed and well-nourished.  HENT:  Head: Normocephalic and atraumatic.  Eyes: Conjunctivae are normal. Pupils are equal, round, and reactive to light.  Neck: Normal range of motion.  Cardiovascular: Normal heart sounds.  Respiratory: He is in respiratory distress.  GI: Soft.  Musculoskeletal: Normal range of motion.  Neurological: He is alert.  Skin: Skin is warm and dry.  Psychiatric: His affect is blunt. His speech is delayed. He is slowed. Thought content is not paranoid. He expresses no homicidal and no suicidal ideation.    Review of Systems  Constitutional: Negative.   HENT: Negative.   Eyes: Negative.   Respiratory: Positive for shortness of breath.   Cardiovascular: Negative.   Gastrointestinal: Positive for abdominal pain.  Musculoskeletal: Negative.   Skin: Negative.   Neurological: Negative.   Psychiatric/Behavioral: Negative for depression, hallucinations, substance abuse and suicidal ideas.    Blood pressure (!) 170/76, pulse (!) 102, temperature 98.6 F (37 C), temperature source Oral, resp. rate (!) 32, height 5' 6"  (1.676 m), weight 110.1 kg (242 lb 11.6 oz), SpO2 92 %.Body mass index is 39.18 kg/m.  General Appearance: Casual  Eye Contact:  Fair  Speech:  Slow  Volume:  Decreased  Mood:  Euthymic  Affect:  Constricted  Thought Process:  Coherent  Orientation:  Full (Time, Place, and Person)  Thought Content:  Logical  Suicidal Thoughts:  No  Homicidal Thoughts:  No  Memory:  Immediate;   Fair Recent;   Fair Remote;   Fair  Judgement:  Fair  Insight:  Fair  Psychomotor Activity:  Decreased  Concentration:  Concentration: Fair  Recall:  [employer] of Knowledge:  Fair  Language:  Fair  Akathisia:  No  Handed:  Right  AIMS (if indicated):     Assets:  Desire for Improvement  ADL's:  Impaired  Cognition:  WNL  Sleep:        Treatment Plan Summary: Plan Patient is  stabilizing no acute psychiatric problem.  Anxiety is situational and is improved.  No psychiatric dangerousness.  No further psychiatric treatment or intervention.  Signing off for now.  Disposition: No evidence of imminent risk to self or others at present.    Alethia Berthold, MD 08/08/2017 7:15 PM

## 2017-08-08 NOTE — Progress Notes (Signed)
MEDICATION RELATED CONSULT NOTE  Pharmacy Consult for electrolyte/glucose/constipation management.  Assessment: 32 yo M admitted to [facility] on 1/22 for evaluation of severe anasarca. Transferred to ICU 1/31 with acute respiratory failure from pulmonary edema and placed on BiPAP. Patient also in acute renal failure with hyponatremia and hypokalemia and CKD stage II.  Plan:  1. No electrolyte replacement is indicated at this time. Will recheck with AM labs.   2. Per diabetes coordinator note 2/1 Will decrease Novolog correction to moderate 0-15 units q4h. Continue Levemir 25 units qHS.  3. Patient has had regular bowel movements throughout admission with last bowl movement 1/31. Will continue to monitor for appropriateness of bowel regimen.   Pharmacy will continue to monitor per consult.  Allergies  Allergen Reactions  . Lisinopril Swelling    Patient Measurements: Height: 5\' 6"  (167.6 cm) Weight: 242 lb 11.6 oz (110.1 kg) IBW/kg (Calculated) : 63.8  Vital Signs: Temp: 98.3 F (36.8 C) (02/01 0731) Temp Source: Oral (02/01 0731) BP: 152/70 (02/01 1200) Pulse Rate: 99 (02/01 1200) Intake/Output from previous day: 01/31 0701 - 02/01 0700 In: 102.7 [I.V.:102.7] Out: 2225 [Urine:2225] Intake/Output from this shift: Total I/O In: 92 [I.V.:42; IV Piggyback:50] Out: 200 [Urine:200]  Labs: Recent Labs    08/06/17 0726 08/07/17 0230 08/07/17 1518 08/08/17 0210  WBC 12.8* 12.3* 12.3*  --   HGB 7.0* 8.5* 8.6*  --   HCT 21.4* 25.5* 26.5*  --   PLT 370 374 408  --   CREATININE 3.56* 3.54* 3.43* 3.43*  MG  --  2.4 2.5*  --   PHOS 5.4*  --  5.3*  --   ALBUMIN 2.4*  --  2.3*  --   PROT  --   --  5.7*  --   AST  --   --  27  --   ALT  --   --  21  --   ALKPHOS  --   --  84  --   BILITOT  --   --  0.6  --    Estimated Creatinine Clearance: 35.7 mL/min (A) (by C-G formula based on SCr of 3.43 mg/dL (H)).   Medical History: Past Medical History:  Diagnosis Date  .  Abscess of groin, right 09/09/2016  . 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 Acute gastroenteritis 09/11/2016  . AKI (acute kidney injury) (HCC) 02/19/2013  . Anemia 12/14/2015  . Dehydration 09/11/2016  . Diabetes mellitus without complication (HCC)   . Diabetic acidosis without coma (HCC) 06/01/2016  . Elevated bilirubin 09/11/2016  . Essential hypertension with goal blood pressure less than 130/80 02/07/2015  . Hypercholesterolemia 02/25/2013  . Hyponatremia 09/11/2016  . Hypophosphatemia 12/14/2015  . Hypovitaminosis D 12/16/2015  . Mass of right inguinal region   . Microcytic anemia 11/17/2014  . Non-ST elevation myocardial infarction (NSTEMI) (HCC) 02/19/2013  . Poorly controlled type 1 diabetes mellitus (HCC) 03/03/2012   Overview:  Diagnosed 02/2012.  Now insulin-dependent  . Proteinuria 12/14/2015   Medications:  Medications Prior to Admission  Medication Sig Dispense Refill Last Dose  . FLUoxetine (PROZAC) 40 MG capsule Take 40 mg by mouth daily.   07/28/2017 at Unknown time  . furosemide (LASIX) 40 MG tablet Take 1 tablet (40 mg total) by mouth 2 (two) times daily. 30 tablet 0 07/28/2017 at Unknown time  . insulin detemir (LEVEMIR) 100 UNIT/ML injection Inject 30 Units into the skin at bedtime.   07/28/2017 at Unknown time  .  aspirin EC 81 MG tablet Take 1 tablet (81 mg total) by mouth daily. 150 tablet 2 07/27/2017  . atorvastatin (LIPITOR) 40 MG tablet Take 1 tablet (40 mg total) by mouth daily at 6 PM. (Patient not taking: Reported on 07/29/2017) 30 tablet 0 Not Taking at Unknown time  . hydrALAZINE (APRESOLINE) 25 MG tablet Take 1 tablet (25 mg total) by mouth every 8 (eight) hours. (Patient not taking: Reported on 07/29/2017) 90 tablet 0 Not Taking at Unknown time  . insulin aspart (NOVOLOG) 100 UNIT/ML injection Inject 5 Units into the skin 3 (three) times daily before meals. (Patient taking differently: Inject 10 Units into the skin 3 (three) times daily with meals.  ) 100 mL 0 06/08/2017 at pm  . potassium chloride SA (K-DUR,KLOR-CON) 20 MEQ tablet Take 2 tablets (40 mEq total) by mouth 2 (two) times daily. (Patient not taking: Reported on 07/29/2017) 60 tablet 0 Not Taking at Unknown time     [place] Jayton Popelka  Pharmacy Student 08/08/2017,1:26 PM

## 2017-08-08 NOTE — Progress Notes (Signed)
PULMONARY / CRITICAL CARE MEDICINE   Name: [name] MRN: [id number] DOB: [birth year]    ADMISSION DATE:  07/29/2017 CONSULTATION DATE:  08/07/2017  REFERRING MD:  Dr. Amado Coe  CHIEF COMPLAINT:  Worsening hypoxia  HISTORY OF PRESENT ILLNESS:   This is a 32 y/o AA male with a PMH as indicated below who was admitted with acute renal failure, anasarca, uncontrolled diabetes, hypoalbuminemia, HCAP and acute CHF exacerbation. He was started on a furosemide infusion, antibiotics and albumin.  His CXR showed worsening [ulmonary edema and bilateral pleural effusions. He is being transferred to the ICU for further management.    REVIEW OF SYSTEMS:   Dysnoea at rest and exertion  SUBJECTIVE:  Patient reported that he felt better from overnight.  VITAL SIGNS: BP (!) 170/76   Pulse (!) 102   Temp 98.6 F (37 C) (Oral)   Resp (!) 32   Ht 5\' 6"  (1.676 m)   Wt 242 lb 11.6 oz (110.1 kg)   SpO2 92%   BMI 39.18 kg/m   HEMODYNAMICS:  No compromise  VENTILATOR SETTINGS: FiO2 (%):  [45 %-78 %] 45 %  INTAKE / OUTPUT: I/O last 3 completed shifts: In: 306.8 [I.V.:206.8; IV Piggyback:100] Out: 3475 [Urine:3475]  PHYSICAL EXAMINATION: General:  No acute distress Neuro:  AAAx3 HEENT:  PERRL Cardiovascular:  RRR Lungs:  Bilateral rhonchi Abdomen:  anasarca Musculoskeletal:  4+ oedema, right BKA Skin:  Warm and dry  LABS:  BMET Recent Labs  Lab 08/07/17 0230 08/07/17 1518 08/08/17 0210  NA 133* 135 134*  K 3.9 4.0 3.9  CL 99* 102 100*  CO2 24 23 23   BUN 56* 57* 60*  CREATININE 3.54* 3.43* 3.43*  GLUCOSE 227* 75 219*    Electrolytes Recent Labs  Lab 08/04/17 0447  08/06/17 0726 08/07/17 0230 08/07/17 1518 08/08/17 0210  CALCIUM 7.7*   < > 7.9* 7.9* 8.3* 7.9*  MG 2.2  --   --  2.4 2.5*  --   PHOS  --   --  5.4*  --  5.3*  --    < > = values in this interval not displayed.    CBC Recent Labs  Lab 08/06/17 0726 08/07/17 0230 08/07/17 1518  WBC 12.8* 12.3*  12.3*  HGB 7.0* 8.5* 8.6*  HCT 21.4* 25.5* 26.5*  PLT 370 374 408    Coag's No results for input(s): APTT, INR in the last 168 hours.  Sepsis Markers Recent Labs  Lab 08/02/17 1243 08/02/17 1530 08/03/17 0453 08/04/17 0447 08/07/17 0230 08/07/17 1518  LATICACIDVEN 1.2 1.1  --   --   --  0.7  PROCALCITON 1.78  --  1.73 1.56 1.44  --     ABG Recent Labs  Lab 08/07/17 1345  PHART 7.46*  PCO2ART 38  PO2ART 66*    Liver Enzymes Recent Labs  Lab 08/03/17 2015 08/06/17 0726 08/07/17 1518  AST 65*  --  27  ALT 43  --  21  ALKPHOS 80  --  84  BILITOT 0.6  --  0.6  ALBUMIN 2.2* 2.4* 2.3*    Cardiac Enzymes Recent Labs  Lab 08/07/17 1518 08/07/17 2000 08/08/17 0209  TROPONINI 0.05* 0.05* 0.05*    Glucose Recent Labs  Lab 08/07/17 1958 08/07/17 2328 08/08/17 0301 08/08/17 0731 08/08/17 1142 08/08/17 1653  GLUCAP 157* 226* 209* 164* 175* 212*    Imaging Dg Chest Port 1 View  Result Date: 08/08/2017 CLINICAL DATA:  Pulmonary edema EXAM: PORTABLE CHEST - 1 VIEW  COMPARISON:  the previous day's study FINDINGS: Relatively low lung volumes. Extensive bilateral patchy airspace opacities, now slightly worse on the right than left, but stable in overall severity since previous exam. Heart size upper limits normal for technique. No effusion.  No pneumothorax. Visualized bones unremarkable. IMPRESSION: 1. Persistent extensive bilateral airspace opacities. Electronically Signed   By: Corlis Leak  Hassell M.D.   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 BUN/Creat static P:   Discussed with Dr Wynelle LinkKolluru re- increase of Lasix infusion  GASTROINTESTINAL A:   No active issues, tolerating nutrition P:   Continue to moinitor  HEMATOLOGIC A:   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

## 2017-08-08 NOTE — Telephone Encounter (Signed)
Lm on brothers voicemail re: eligibility paperwork for [facility]. Asked for a return call and also stated we would try to reach him at another time.

## 2017-08-08 NOTE — Evaluation (Signed)
Physical Therapy RE-Evaluation Patient Details Name: [name] MRN: [id number] DOB: 23-Sep-1985 Today's Date: 08/08/2017   History of Present Illness  Pt admitted for anasarca with complaints of chest pain. Pt with history of AKI, DM, HTN, R AKA, and NSTEMI, and hyponatremia. Pt with recent admission for similar symptoms. Hospital stay has been complicated by severe respiratory failure and progressive cardio renal syndrom. Pt now in CCU, received new order for continued PT  Clinical Impression  Pt is a pleasant 32 year old male who was admitted for anasarca. Pt re-evaluated this date as he had change in status requiring CCU admission. Pt refuses mobility this date although is able to self position and turn in bed. Pt encouraged to continue mobility efforts up to recliner with RN staff this weekend as well as continue there-ex 2x/day. Reviewed there-ex. Pt demonstrates deficits with strength/mobility/endurance. Pt unsure if he can don prosthesis secondary to edema present in AKA. Will continue to progress. Would benefit from skilled PT to address above deficits and promote optimal return to PLOF.       Follow Up Recommendations Outpatient PT    Equipment Recommendations  None recommended by PT    Recommendations for Other Services       Precautions / Restrictions Precautions Precautions: Fall Restrictions Weight Bearing Restrictions: No      Mobility  Bed Mobility               General bed mobility comments: able to roll in bed and self position with independence. Refused all further mobility at this time as respiratory entered for breathing treatment  Transfers                    Ambulation/Gait                Stairs            Wheelchair Mobility    Modified Rankin (Stroke Patients Only)       Balance                                             Pertinent Vitals/Pain Pain Assessment: No/denies pain    Home Living  Family/patient expects to be discharged to:: Private residence Living Arrangements: Other (Comment)(lives with brother) Available Help at Discharge: Friend(s) Type of Home: House Home Access: Ramped entrance     Home Layout: One level Home Equipment: Environmental consultant - 2 wheels;Cane - single point      Prior Function Level of Independence: Independent with assistive device(s)         Comments: 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 Reports he recently has been using RW secondary to weakness.     Hand Dominance        Extremity/Trunk Assessment   Upper Extremity Assessment Upper Extremity Assessment: Generalized weakness(B UE grossly 4+/5)    Lower Extremity Assessment Lower Extremity Assessment: Generalized weakness(L LE grossly 4/5; R LE grossly 3/5)       Communication   Communication: No difficulties  Cognition Arousal/Alertness: Awake/alert Behavior During Therapy: WFL for tasks assessed/performed Overall Cognitive Status: Within Functional Limits for tasks assessed                                        General  Comments      Exercises Other Exercises Other Exercises: supine ther-ex performed on B LE including SLRs, hip abd/add, and L LE SAQ. ALl ther-ex performed x 10 reps with supervision. Pt complains of fatigue with ther-ex and slight knee pain. Appears to improve with resting. Educated on continuing therex to promote strength while in bed   Assessment/Plan    PT Assessment Patient needs continued PT services  PT Problem List Decreased strength;Decreased balance;Cardiopulmonary status limiting activity       PT Treatment Interventions Gait training;Therapeutic exercise    PT Goals (Current goals can be found in the Care Plan section)  Acute Rehab PT Goals Patient Stated Goal: to be less SOB PT Goal Formulation: With patient Time For Goal Achievement: 08/22/17 Potential to Achieve Goals: Good    Frequency Min 2X/week    Barriers to discharge        Co-evaluation               AM-PAC PT "6 Clicks" Daily Activity  Outcome Measure Difficulty turning over in bed (including adjusting bedclothes, sheets and blankets)?: Unable Difficulty moving from lying on back to sitting on the side of the bed? : Unable Difficulty sitting down on and standing up from a chair with arms (e.g., wheelchair, bedside commode, etc,.)?: A Lot Help needed moving to and from a bed to chair (including a wheelchair)?: A Lot Help needed walking in hospital room?: A Lot Help needed climbing 3-5 steps with a railing? : A Lot 6 Click Score: 10    End of Session Equipment Utilized During Treatment: Oxygen Activity Tolerance: Patient tolerated treatment well Patient left: in bed Nurse Communication: Mobility status PT Visit Diagnosis: Unsteadiness on feet (R26.81);Muscle weakness (generalized) (M62.81);Difficulty in walking, not elsewhere classified (R26.2)    Time: [id number] PT Time Calculation (min) (ACUTE ONLY): 17 min   Charges:   PT Evaluation $PT Re-evaluation: 1 Re-eval     PT G Codes:        Henry PalauStephanie Tytionna Gordon, PT, DPT [phone]   [name] 08/08/2017, 5:22 PM

## 2017-08-08 NOTE — Plan of Care (Signed)
Patient  had two episodes of waking up with shortness of breath. Bilateral crackles heard on assessment. Bipap in place overnight. Prn breathing treatments given per order with effective relief. Minimal urine output with lasix drip infusing at this time. Patient is alert and oriented and resting in bed with no complaints of pain. VSS, and no signs of acute distress at present. Will continue to monitor.     Education: Knowledge of General Education information will improve 08/08/2017 0512 - Progressing by Hollice Espyoberson, Nani Ingram V, RN Spiritual Needs Ability to function at adequate level 08/08/2017 0512 - Progressing by Hollice Espyoberson, Haji Delaine V, RN

## 2017-08-08 NOTE — Progress Notes (Signed)
Patient ID: [name], male   DOB: 11/28/1985, 32 y.o.   MRN: [id number]  [employer] PROGRESS NOTE  [name] [name]:[id number] DOB: 28-Dec-1985 DOA: 07/29/2017 PCP: Oswaldo Conroy, MD  HPI/Subjective: Patient is doing much better.  Shortness of breath improved, off BiPAP   Objective: Vitals:   08/08/17 1143 08/08/17 1200  BP:  (!) 152/70  Pulse: 99 99  Resp: (!) 30 (!) 25  Temp:    SpO2: (!) 89% 90%    Filed Weights   08/06/17 0526 08/07/17 0509 08/08/17 0305  Weight: 107.6 kg (237 lb 3.4 oz) 110.1 kg (242 lb 11.2 oz) 110.1 kg (242 lb 11.6 oz)    ROS: Review of Systems  Constitutional: Negative for chills and fever.  Eyes: Negative for blurred vision.  Respiratory: Positive for cough, sputum production and shortness of breath.   Cardiovascular: Negative for chest pain.  Gastrointestinal: Negative for abdominal pain, constipation, diarrhea, nausea and vomiting.  Genitourinary: Negative for dysuria.  Musculoskeletal: Negative for joint pain.  Neurological: Negative for dizziness and headaches.   Exam: Physical Exam  Constitutional: He is oriented to person, place, and time.  HENT:  Nose: No mucosal edema.  Mouth/Throat: No oropharyngeal exudate or posterior oropharyngeal edema.  Eyes: Conjunctivae, EOM and lids are normal. Pupils are equal, round, and reactive to light.  Neck: No JVD present. Carotid bruit is not present. No edema present. No thyroid mass and no thyromegaly present.  Cardiovascular: S1 normal and S2 normal. Exam reveals no gallop.  No murmur heard. Pulses:      Dorsalis pedis pulses are 2+ on the right side, and 2+ on the left side.  Respiratory: No respiratory distress. He has decreased breath sounds in the right lower field and the left lower field. He has no wheezes. He has no rhonchi. He has no rales.  GI: Soft. Bowel sounds are normal. He exhibits distension. There is no tenderness.  Musculoskeletal:       Left ankle: He exhibits  swelling.  Lymphadenopathy:    He has no cervical adenopathy.  Neurological: He is alert and oriented to person, place, and time. No cranial nerve deficit.  Skin: Skin is warm. No rash noted. Nails show no clubbing.  Psychiatric: He has a normal mood and affect.      Data Reviewed: Basic Metabolic Panel: Recent Labs  Lab 08/02/17 1805 08/03/17 0453  08/04/17 0447 08/05/17 0557 08/06/17 0726 08/07/17 0230 08/07/17 1518 08/08/17 0210  NA  --  135   < > 134* 133* 132* 133* 135 134*  K 4.1 3.7   < > 3.6 4.2 3.7 3.9 4.0 3.9  CL  --  102   < > 102 100* 98* 99* 102 100*  CO2  --  26   < > 26 24 22 24 23 23   GLUCOSE  --  97   < > 70 180* 215* 227* 75 219*  BUN  --  28*   < > 29* 37* 53* 56* 57* 60*  CREATININE  --  2.04*   < > 2.53* 3.01* 3.56* 3.54* 3.43* 3.43*  CALCIUM  --  8.1*   < > 7.7* 7.9* 7.9* 7.9* 8.3* 7.9*  MG 2.1 2.0  --  2.2  --   --  2.4 2.5*  --   PHOS  --   --   --   --   --  5.4*  --  5.3*  --    < > = values in this  interval not displayed.   Liver Function Tests: Recent Labs  Lab 08/03/17 2015 08/06/17 0726 08/07/17 1518  AST 65*  --  27  ALT 43  --  21  ALKPHOS 80  --  84  BILITOT 0.6  --  0.6  PROT 5.3*  --  5.7*  ALBUMIN 2.2* 2.4* 2.3*   CBC: Recent Labs  Lab 08/05/17 0557 08/06/17 0726 08/07/17 0230 08/07/17 1518  WBC 10.0 12.8* 12.3* 12.3*  HGB 6.8* 7.0* 8.5* 8.6*  HCT 20.6* 21.4* 25.5* 26.5*  MCV 79.9* 80.3 80.3 80.7  PLT 309 370 374 408   Cardiac Enzymes: Recent Labs  Lab 08/05/17 0559 08/05/17 1200 08/07/17 1518 08/07/17 2000 08/08/17 0209  TROPONINI 0.09* 0.08* 0.05* 0.05* 0.05*   BNP (last 3 results) Recent Labs    07/22/17 1640 07/29/17 0955 08/07/17 0230  BNP 149.0* 93.0 398.0*     CBG: Recent Labs  Lab 08/07/17 1958 08/07/17 2328 08/08/17 0301 08/08/17 0731 08/08/17 1142  GLUCAP 157* 226* 209* 164* 175*    Scheduled Meds: . amLODipine  5 mg Oral Daily  . aspirin EC  81 mg Oral Daily  . atorvastatin  40  mg Oral q1800  . budesonide (PULMICORT) nebulizer solution  0.25 mg Nebulization BID  . carvedilol  12.5 mg Oral BID WC  . famotidine  20 mg Oral BID  . FLUoxetine  40 mg Oral Daily  . heparin injection (subcutaneous)  5,000 Units Subcutaneous Q8H  . hydrALAZINE  50 mg Oral Q8H  . insulin aspart  0-15 Units Subcutaneous Q4H  . insulin detemir  25 Units Subcutaneous QHS  . ipratropium-albuterol  3 mL Nebulization Q4H  . magnesium oxide  400 mg Oral BID  . mouth rinse  15 mL Mouth Rinse BID  . methylPREDNISolone (SOLU-MEDROL) injection  40 mg Intravenous Q12H  . sodium chloride flush  10-40 mL Intracatheter Q12H   Continuous Infusions: . sodium chloride Stopped (08/05/17 1730)  . furosemide (LASIX) infusion 8 mg/hr (08/08/17 1000)  . piperacillin-tazobactam (ZOSYN)  IV Stopped (08/08/17 1146)    Assessment/Plan:  #Acute respiratory failure secondary to fluid overload  And anasarca due to nephrotic syndrome and proteinuria.   Patient is feeling much better today off BiPAP Lasix drip resumed      Daily weight monitoring and nephrology is following. Echocardiogram done results are pending   # AKI on Chronic kidney disease stage II Lasix drip is resumed as patient is very fluid overloaded Creatinine 1.7-2.04--2.53-3.01--3.54--3.43 creatinine plateaued  low threshold for dialysis Nephrology is following.  Vancomycin discontinued Avoid nephrotoxins and  renal dose medications  #Symptomatic anemia from chronic kidney disease Hb- 7.2- 6.8-7.0--8.5 Transfused 2 units of blood    # Sepsis secondary to multilobar pneumonia with bronchospasm with pleuritic chest pain troponins are non-trending continue Zosyn ; Flu test is negative. discontinue vancomycin , MRSA PCR is negative.   Blood cultures are negative and urine cultures ordered but does not seem to be done Lactic acid in the normal range CT angiogram is negative for pulmonary embolism Steroid treatments and bronchodilator  therapy   # Type 2 diabetes mellitus .  Restarted on his usual insulin and sugars much better.  Patient states that he has been compliant but his hemoglobin A1c is very elevated.  NovoLog 5 units 3 times daily with each meal Levemir 25 units   # Hypokalemia and hypomagnesemia replace magnesium and potassium orally prn  # Essential hypertension -blood pressure elevated   on Norvasc, Coreg dose  increased continue hydralazine and titrate as needed  # Hyperlipidemia unspecified on atorvastatin  # Depression on fluoxetine, psychiatry consulted  # Anemia of chronic disease.  Continue to monitor hemoglobin. Code Status:     Code Status Orders  (From admission, onward)        Start     Ordered   07/29/17 1356  Full code  Continuous     07/29/17 1355    Code Status History    Date Active Date Inactive Code Status Order ID Comments User Context   06/09/2017 15:22 06/12/2017 17:22 Full Code [id number]  Ramonita LabGouru, Nashon Erbes, MD Inpatient   04/14/2017 14:05 04/18/2017 20:05 Full Code [id number]  Shaune Pollackhen, Qing, MD Inpatient   09/09/2016 19:55 09/11/2016 18:59 Full Code [id number]  Altamese DillingVachhani, Vaibhavkumar, MD Inpatient   06/01/2016 19:26 06/02/2016 18:35 Full Code [id number]  Ramonita LabGouru, Mindy Gali, MD Inpatient     Disposition Plan: To be determined  Consultants:  Nephrology   time spent: 36 minutes  Plan of care discussed in detail with the patient and voicemail left to his brother [place] to call back regarding update  Patient verbalized understanding of the plan Ramonita LabAruna Tomoko Sandra  [employer]

## 2017-08-08 NOTE — Care Management (Addendum)
[facility] has supplied patient with working glucometer and meter strips.  This RNCM will provide glucometer to patient. Patient weaned to acute 6L nasal cannula. Lips peeling/bleeding- gave him lip ointment from mouth care kit at bedside and wet papertowel.  He lives with a friend and ambulates at baseline with cane. He appreciates glucometer and agrees with referral to [facility]. CM will need to follow.

## 2017-08-08 NOTE — Progress Notes (Signed)
[facility]  ROUNDING NOTE   Subjective:   UOP not accurate.   Furosemide gtt 8mg /hr  Creatinine 3.43 (3.43)  Objective:  Vital signs in last 24 hours:  Temp:  [97.8 F (36.6 C)-99 F (37.2 C)] 98.3 F (36.8 C) (02/01 0731) Pulse Rate:  [95-106] 106 (02/01 0731) Resp:  [10-35] 10 (02/01 0731) BP: (99-171)/(59-93) 155/82 (02/01 0731) SpO2:  [90 %-100 %] 98 % (02/01 0700) FiO2 (%):  [45 %-78 %] 45 % (02/01 0731) Weight:  [110.1 kg (242 lb 11.6 oz)] 110.1 kg (242 lb 11.6 oz) (02/01 0305)  Weight change: 0.012 kg (0.4 oz) Filed Weights   08/06/17 0526 08/07/17 0509 08/08/17 0305  Weight: 107.6 kg (237 lb 3.4 oz) 110.1 kg (242 lb 11.2 oz) 110.1 kg (242 lb 11.6 oz)    Intake/Output: I/O last 3 completed shifts: In: 482.7 [P.O.:30; I.V.:102.7; Blood:350] Out: 2980 [Urine:2980]   Intake/Output this shift:  Total I/O In: 64.8 [I.V.:14.8; IV Piggyback:50] Out: -   Physical Exam: General: No acute distress  Head: Normocephalic, atraumatic. Moist oral mucosal membranes  Eyes: Anicteric  Neck: Supple, trachea midline  Lungs:  Crackles bilaterally  Heart: S1S2 no rubs  Abdomen:  Soft, nontender, bowel sounds present  Extremities: No edema, Right AKA  Neurologic: Awake, alert, following commands  Skin: Venous stasis changes LLE       Basic Metabolic Panel: Recent Labs  Lab 08/02/17 1805 08/03/17 0453  08/04/17 0447 08/05/17 0557 08/06/17 0726 08/07/17 0230 08/07/17 1518 08/08/17 0210  NA  --  135   < > 134* 133* 132* 133* 135 134*  K 4.1 3.7   < > 3.6 4.2 3.7 3.9 4.0 3.9  CL  --  102   < > 102 100* 98* 99* 102 100*  CO2  --  26   < > 26 24 22 24 23 23   GLUCOSE  --  97   < > 70 180* 215* 227* 75 219*  BUN  --  28*   < > 29* 37* 53* 56* 57* 60*  CREATININE  --  2.04*   < > 2.53* 3.01* 3.56* 3.54* 3.43* 3.43*  CALCIUM  --  8.1*   < > 7.7* 7.9* 7.9* 7.9* 8.3* 7.9*  MG 2.1 2.0  --  2.2  --   --  2.4 2.5*  --   PHOS  --   --   --   --   --  5.4*  --   5.3*  --    < > = values in this interval not displayed.    Liver Function Tests: Recent Labs  Lab 08/03/17 2015 08/06/17 0726 08/07/17 1518  AST 65*  --  27  ALT 43  --  21  ALKPHOS 80  --  84  BILITOT 0.6  --  0.6  PROT 5.3*  --  5.7*  ALBUMIN 2.2* 2.4* 2.3*   No results for input(s): LIPASE, AMYLASE in the last 168 hours. No results for input(s): AMMONIA in the last 168 hours.  CBC: Recent Labs  Lab 08/05/17 0557 08/06/17 0726 08/07/17 0230 08/07/17 1518  WBC 10.0 12.8* 12.3* 12.3*  HGB 6.8* 7.0* 8.5* 8.6*  HCT 20.6* 21.4* 25.5* 26.5*  MCV 79.9* 80.3 80.3 80.7  PLT 309 370 374 408    Cardiac Enzymes: Recent Labs  Lab 08/05/17 0559 08/05/17 1200 08/07/17 1518 08/07/17 2000 08/08/17 0209  TROPONINI 0.09* 0.08* 0.05* 0.05* 0.05*    BNP: Invalid input(s): POCBNP  CBG:  Recent Labs  Lab 08/07/17 1753 08/07/17 1958 08/07/17 2328 08/08/17 0301 08/08/17 0731  GLUCAP 114* 157* 226* 209* 164*    Microbiology: Results for orders placed or performed during the hospital encounter of 07/29/17  CULTURE, BLOOD (ROUTINE X 2) w Reflex to ID Panel     Status: None   Collection Time: 08/01/17  5:54 PM  Result Value Ref Range Status   Specimen Description BLOOD LEFT ANTECUBITAL  Final   Special Requests   Final    BOTTLES DRAWN AEROBIC AND ANAEROBIC Blood Culture results may not be optimal due to an inadequate volume of blood received in culture bottles   Culture   Final    NO GROWTH 5 DAYS Performed at [facility], [address] [place], [place] [address]    Report Status 08/06/2017 FINAL  Final  CULTURE, BLOOD (ROUTINE X 2) w Reflex to ID Panel     Status: None   Collection Time: 08/01/17  6:03 PM  Result Value Ref Range Status   Specimen Description BLOOD BLOOD RIGHT HAND  Final   Special Requests   Final    BOTTLES DRAWN AEROBIC AND ANAEROBIC Blood Culture adequate volume   Culture   Final    NO GROWTH 5 DAYS Performed at [facility], [address] [place], [place] [address]    Report Status 08/06/2017 FINAL  Final  MRSA PCR Screening     Status: None   Collection Time: 08/04/17  3:48 PM  Result Value Ref Range Status   MRSA by PCR NEGATIVE NEGATIVE Final    Comment:        The GeneXpert MRSA Assay (FDA approved for NASAL specimens only), is one component of a comprehensive MRSA colonization surveillance program. It is not intended to diagnose MRSA infection nor to guide or monitor treatment for MRSA infections. Performed at [facility], [address] [place], [place] [address]     Coagulation Studies: No results for input(s): LABPROT, INR in the last 72 hours.  Urinalysis: No results for input(s): COLORURINE, LABSPEC, PHURINE, GLUCOSEU, HGBUR, BILIRUBINUR, KETONESUR, PROTEINUR, UROBILINOGEN, NITRITE, LEUKOCYTESUR in the last 72 hours.  Invalid input(s): APPERANCEUR    Imaging: Dg Chest Port 1 View  Result Date: 08/08/2017 CLINICAL DATA:  Pulmonary edema EXAM: PORTABLE CHEST - 1 VIEW COMPARISON:  the previous day's study FINDINGS: Relatively low lung volumes. Extensive bilateral patchy airspace opacities, now slightly worse on the right than left, but stable in overall severity since previous exam. Heart size upper limits normal for technique. No effusion.  No pneumothorax. Visualized bones unremarkable. IMPRESSION: 1. Persistent extensive bilateral airspace opacities. Electronically Signed   By: Corlis Leak M.D.   On: 08/08/2017 08:12   Dg Chest Port 1 View  Result Date: 08/07/2017 CLINICAL DATA:  Shortness of breath. EXAM: PORTABLE CHEST 1 VIEW COMPARISON:  08/03/2017. FINDINGS: Cardiomegaly. Diffuse progressive bilateral pulmonary infiltrates/edema. Findings are consistent with progressive CHF and/or pneumonia. No pleural effusion or pneumothorax. IMPRESSION: Cardiomegaly with diffuse progressive bilateral pulmonary infiltrates/edema. Findings are consistent with progressive CHF and/or  pneumonia. Electronically Signed   By: Maisie Fus  Register   On: 08/07/2017 10:43     Medications:   . sodium chloride Stopped (08/05/17 1730)  . furosemide (LASIX) infusion 8 mg/hr (08/08/17 0751)  . piperacillin-tazobactam (ZOSYN)  IV 3.375 g (08/08/17 0751)   . amLODipine  5 mg Oral Daily  . aspirin EC  81 mg Oral Daily  . atorvastatin  40 mg Oral q1800  .  budesonide (PULMICORT) nebulizer solution  0.25 mg Nebulization BID  . carvedilol  12.5 mg Oral BID WC  . famotidine  20 mg Oral BID  . FLUoxetine  40 mg Oral Daily  . heparin injection (subcutaneous)  5,000 Units Subcutaneous Q8H  . hydrALAZINE  50 mg Oral Q8H  . insulin aspart  0-20 Units Subcutaneous Q4H  . insulin detemir  25 Units Subcutaneous QHS  . ipratropium-albuterol  3 mL Nebulization Q4H  . magnesium oxide  400 mg Oral BID  . mouth rinse  15 mL Mouth Rinse BID  . methylPREDNISolone (SOLU-MEDROL) injection  40 mg Intravenous Q12H  . sodium chloride flush  10-40 mL Intracatheter Q12H   acetaminophen, alum & mag hydroxide-simeth, docusate sodium, ipratropium-albuterol, morphine injection, sodium chloride, sodium chloride flush  Assessment/ Plan:  32 y.o. black male with Diabetes mellitus type 1, hypertension, coronary artery disease, peripheral vascular disease status post right above-the-knee amputation, hyperlipidemiawho was [facility] of severe anasarca.   1. 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  Negative serologic work up.  - Acute renal failure from contrast exposure. Creatinine plateaued.  - Low threshold for dialysis - Placed on furosemide gtt. Increase to 10mg /hr  2. Hypertension: with anasarca/edema: History of angioedema with lisinopril.  - discontinued IV albumin  - furosemide gtt.   - amlodipine, hydralazine, and  carvedilol  3. Acute respiratory failure: from pulmonary edema. Status post 2 units PRBC and IV albumin and renal failure and pneumonia.  - noninvasive ventilation.  - empiric antibiotics.   4. Diabetes mellitus type I with chronic kidney disease: poor control. Hemoglobin A1c of 14.1% on 06/10/17.  - Continue glucose control.   5. Anemia with kidney failure: status post PRBC transfusion.    LOS: 10 Trever Streater 2/1/20199:45 AM

## 2017-08-08 NOTE — Progress Notes (Signed)
Inpatient Diabetes Program Recommendations  AACE/ADA: New Consensus Statement on Inpatient Glycemic Control (2015)  Target Ranges:  Prepandial:   less than 140 mg/dL      Peak postprandial:   less than 180 mg/dL (1-2 hours)      Critically ill patients:  140 - 180 mg/dL   Lab Results  Component Value Date   GLUCAP 164 (H) 08/08/2017   HGBA1C 14.1 (H) 06/10/2017    Review of Glycemic Control  Results for [name], [name] (MRN [id number]) as of 08/08/2017 09:02  Ref. Range 08/07/2017 17:53 08/07/2017 19:58 08/07/2017 23:28 08/08/2017 03:01 08/08/2017 07:31  Glucose-Capillary Latest Ref Range: 65 - 99 mg/dL 119114 (H) 147157 (H) 829226 (H) 209 (H) 164 (H)    History: Type 2  Home DM Meds: Levemir 30 units QHS Novolog 10 units TID  Current Orders:Novolog resistant Correction Scale/ SSI (0-20 units) q4h Levemir 25 units QHS  *Solu-medrol 40mg  q12h  Recommendations: please consider decreasing Novolog correction to moderate 0-15units q4h- poor renal function and decreased steroids put this patient at risk of hypoglycemia.  Susette RacerJulie Sherrica Niehaus, RN, BA, MHA, CDE Diabetes Coordinator Inpatient Diabetes Program  [phone] (Team Pager) [phone] [facility] Office) 08/08/2017 9:09 AM

## 2017-08-09 LAB — GLUCOSE, CAPILLARY
GLUCOSE-CAPILLARY: 199 mg/dL — AB (ref 65–99)
GLUCOSE-CAPILLARY: 218 mg/dL — AB (ref 65–99)
GLUCOSE-CAPILLARY: 242 mg/dL — AB (ref 65–99)
GLUCOSE-CAPILLARY: 256 mg/dL — AB (ref 65–99)
Glucose-Capillary: 199 mg/dL — ABNORMAL HIGH (ref 65–99)
Glucose-Capillary: 187 mg/dL — ABNORMAL HIGH (ref 65–99)
Glucose-Capillary: 164 mg/dL — ABNORMAL HIGH (ref 65–99)

## 2017-08-09 LAB — CBC
HEMATOCRIT: 26.8 % — AB (ref 40.0–52.0)
Hemoglobin: 9 g/dL — ABNORMAL LOW (ref 13.0–18.0)
MCH: 27 pg (ref 26.0–34.0)
MCHC: 33.5 g/dL (ref 32.0–36.0)
MCV: 80.4 fL (ref 80.0–100.0)
PLATELETS: 444 10*3/uL — AB (ref 150–440)
RBC: 3.33 MIL/uL — AB (ref 4.40–5.90)
RDW: 15.7 % — AB (ref 11.5–14.5)
WBC: 17.1 10*3/uL — ABNORMAL HIGH (ref 3.8–10.6)

## 2017-08-09 LAB — BASIC METABOLIC PANEL
ANION GAP: 11 (ref 5–15)
BUN: 66 mg/dL — ABNORMAL HIGH (ref 6–20)
CO2: 24 mmol/L (ref 22–32)
CREATININE: 3.44 mg/dL — AB (ref 0.61–1.24)
Calcium: 8.1 mg/dL — ABNORMAL LOW (ref 8.9–10.3)
Chloride: 101 mmol/L (ref 101–111)
GFR, EST AFRICAN AMERICAN: 25 mL/min — AB (ref >60)
GFR, EST NON AFRICAN AMERICAN: 22 mL/min — AB (ref >60)
GLUCOSE: 186 mg/dL — AB (ref 65–99)
Potassium: 3.5 mmol/L (ref 3.5–5.1)
Sodium: 136 mmol/L (ref 135–145)

## 2017-08-09 MED ORDER — AMLODIPINE BESYLATE 10 MG PO TABS: 10.0000 mg | ORAL_TABLET | Freq: Every day | ORAL | Status: DC

## 2017-08-09 MED ORDER — IPRATROPIUM-ALBUTEROL 0.5-2.5 (3) MG/3ML IN SOLN
3.0000 mL | Freq: Four times a day (QID) | RESPIRATORY_TRACT | Status: DC
  Administered 2017-08-09 – 2017-08-11 (×7): 3 mL via RESPIRATORY_TRACT
  Filled 2017-08-09 (×8): qty 3

## 2017-08-09 MED ORDER — HYDRALAZINE HCL 50 MG PO TABS
100.0000 mg | ORAL_TABLET | Freq: Three times a day (TID) | ORAL | Status: DC
  Administered 2017-08-09 – 2017-08-14 (×16): 100 mg via ORAL
  Filled 2017-08-09 (×16): qty 2

## 2017-08-09 MED ORDER — POTASSIUM CHLORIDE 20 MEQ PO PACK
60.0000 meq | PACK | Freq: Once | ORAL | Status: AC
  Administered 2017-08-09: 60 meq via ORAL
  Filled 2017-08-09: qty 3

## 2017-08-09 NOTE — Progress Notes (Signed)
PULMONARY / CRITICAL CARE MEDICINE   Name: [name] MRN: [id number] DOB: [birth year]    ADMISSION DATE:  07/29/2017 CONSULTATION DATE:  08/07/2017  REFERRING MD:  Dr. Amado Coe  CHIEF COMPLAINT:  Worsening hypoxia  HISTORY OF PRESENT ILLNESS:   This is a 32 y/o AA male with a PMH as indicated below who was admitted with acute renal failure, anasarca, uncontrolled diabetes, hypoalbuminemia, HCAP and acute CHF exacerbation. He was started on a furosemide infusion, antibiotics and albumin.  His CXR showed worsening [ulmonary edema and bilateral pleural effusions. He is being transferred to the ICU for further management.  SUBJECTIVE:  Continues to improve. No acute issues overnight. Still gets dyspneic with exertion  VITAL SIGNS: BP (!) 157/85   Pulse 100   Temp 98.8 F (37.1 C) (Axillary)   Resp 20   Ht 5\' 6"  (1.676 m)   Wt 108.6 kg (239 lb 6.7 oz)   SpO2 99%   BMI 38.64 kg/m   HEMODYNAMICS:  No compromise  VENTILATOR SETTINGS: FiO2 (%):  [45 %] 45 %  INTAKE / OUTPUT: I/O last 3 completed shifts: In: 306.8 [I.V.:206.8; IV Piggyback:100] Out: 3475 [Urine:3475]  PHYSICAL EXAMINATION: General:  No acute distress Neuro:  AAAx3 HEENT:  PERRLA, trachea midline Cardiovascular:  RRR Lungs:  Improved crackles; No wheezing Abdomen:  Anasarca improved Musculoskeletal:  3+ edema, right BKA Skin:  Warm and dry  LABS:  BMET Recent Labs  Lab 08/07/17 1518 08/08/17 0210 08/09/17 0436  NA 135 134* 136  K 4.0 3.9 3.5  CL 102 100* 101  CO2 23 23 24   BUN 57* 60* 66*  CREATININE 3.43* 3.43* 3.44*  GLUCOSE 75 219* 186*    Electrolytes Recent Labs  Lab 08/04/17 0447  08/06/17 0726 08/07/17 0230 08/07/17 1518 08/08/17 0210 08/09/17 0436  CALCIUM 7.7*   < > 7.9* 7.9* 8.3* 7.9* 8.1*  MG 2.2  --   --  2.4 2.5*  --   --   PHOS  --   --  5.4*  --  5.3*  --   --    < > = values in this interval not displayed.    CBC Recent Labs  Lab 08/06/17 0726 08/07/17 0230  08/07/17 1518  WBC 12.8* 12.3* 12.3*  HGB 7.0* 8.5* 8.6*  HCT 21.4* 25.5* 26.5*  PLT 370 374 408    Coag's No results for input(s): APTT, INR in the last 168 hours.  Sepsis Markers Recent Labs  Lab 08/02/17 1243 08/02/17 1530 08/03/17 0453 08/04/17 0447 08/07/17 0230 08/07/17 1518  LATICACIDVEN 1.2 1.1  --   --   --  0.7  PROCALCITON 1.78  --  1.73 1.56 1.44  --     ABG Recent Labs  Lab 08/07/17 1345  PHART 7.46*  PCO2ART 38  PO2ART 66*    Liver Enzymes Recent Labs  Lab 08/03/17 2015 08/06/17 0726 08/07/17 1518  AST 65*  --  27  ALT 43  --  21  ALKPHOS 80  --  84  BILITOT 0.6  --  0.6  ALBUMIN 2.2* 2.4* 2.3*    Cardiac Enzymes Recent Labs  Lab 08/07/17 1518 08/07/17 2000 08/08/17 0209  TROPONINI 0.05* 0.05* 0.05*    Glucose Recent Labs  Lab 08/08/17 1142 08/08/17 1653 08/08/17 1934 08/08/17 2344 08/09/17 0308 08/09/17 0423  GLUCAP 175* 212* 224* 228* 199* 187*    Imaging Dg Chest Port 1 View  Result Date: 08/08/2017 CLINICAL DATA:  Pulmonary edema EXAM: PORTABLE CHEST -  1 VIEW COMPARISON:  the previous day's study FINDINGS: Relatively low lung volumes. Extensive bilateral patchy airspace opacities, now slightly worse on the right than left, but stable in overall severity since previous exam. Heart size upper limits normal for technique. No effusion.  No pneumothorax. Visualized bones unremarkable. IMPRESSION: 1. Persistent extensive bilateral airspace opacities. Electronically Signed   By: Corlis Leak  Hassell M.D.   On: 08/08/2017 08:12    ANTIBIOTICS: reviewed  SIGNIFICANT EVENTS: None  LINES/TUBES: None   ASSESSMENT / PLAN:  PULMONARY A: Acute hypoxic respiratory failure Acute fluid overload from worsening renal failure with anasarca  P:   Continue off BIPAP as he has improved  CARDIOVASCULAR A:  Acute exacerbation of diastolic dysfunction CHF Hypertension with anasarca P:  Continue  Lasix drip and follow up urinary  output  RENAL A:   Acute on Chronic stage 2 Renal failure with nephrotic syndrome BUN/Creat static P:   Discussed with Dr Wynelle LinkKolluru re- increase of Lasix infusion 2/1  GASTROINTESTINAL A:   No active issues, tolerating nutrition P:   Continue to moinitor  HEMATOLOGIC A:   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 [facility] ANP-BC Pulmonary and Critical Care Medicine [facility] Pager [phone] or [phone]  NB: This document was prepared using Dragon voice recognition software and may include unintentional dictation errors.   08/09/2017, 5:22 AM

## 2017-08-09 NOTE — Progress Notes (Signed)
MEDICATION RELATED CONSULT NOTE  Pharmacy Consult for electrolyte/glucose/constipation management.  Assessment: 32 yo M admitted to [facility] on 1/22 for evaluation of severe anasarca. Transferred to ICU 1/31 with acute respiratory failure from pulmonary edema and placed on BiPAP. Patient also in acute renal failure with hyponatremia and hypokalemia and CKD stage II.  Plan:  1. No electrolyte replacement is indicated at this time. Will recheck with AM labs.   2. Per diabetes coordinator note 2/1 Will decrease Novolog correction to moderate 0-15 units q4h. Continue Levemir 25 units qHS.  3. Patient has had regular bowel movements throughout admission with last bowl movement 1/31. Will continue to monitor for appropriateness of bowel regimen.  Pharmacy will continue to monitor per consult.  Allergies  Allergen Reactions  . Lisinopril Swelling    Patient Measurements: Height: 5\' 6"  (167.6 cm) Weight: 239 lb 6.7 oz (108.6 kg) IBW/kg (Calculated) : 63.8  Vital Signs: Temp: 98.8 F (37.1 C) (02/02 0800) Temp Source: Oral (02/02 0800) BP: 148/79 (02/02 0800) Pulse Rate: 95 (02/02 0800) Intake/Output from previous day: 02/01 0701 - 02/02 0700 In: 316.5 [I.V.:216.5; IV Piggyback:100] Out: 2300 [Urine:2300] Intake/Output from this shift: Total I/O In: 40 [I.V.:40] Out: 675 [Urine:675]  Labs: Recent Labs    08/07/17 0230 08/07/17 1518 08/08/17 0210 08/09/17 0436 08/09/17 0652  WBC 12.3* 12.3*  --   --  17.1*  HGB 8.5* 8.6*  --   --  9.0*  HCT 25.5* 26.5*  --   --  26.8*  PLT 374 408  --   --  444*  CREATININE 3.54* 3.43* 3.43* 3.44*  --   MG 2.4 2.5*  --   --   --   PHOS  --  5.3*  --   --   --   ALBUMIN  --  2.3*  --   --   --   PROT  --  5.7*  --   --   --   AST  --  27  --   --   --   ALT  --  21  --   --   --   ALKPHOS  --  84  --   --   --   BILITOT  --  0.6  --   --   --    Estimated Creatinine Clearance: 35.3 mL/min (A) (by C-G formula based on SCr of 3.44  mg/dL (H)).   Medical History: Past Medical History:  Diagnosis Date  . Abscess of groin, right 09/09/2016  . 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 Acute gastroenteritis 09/11/2016  . AKI (acute kidney injury) (HCC) 02/19/2013  . Anemia 12/14/2015  . Dehydration 09/11/2016  . Diabetes mellitus without complication (HCC)   . Diabetic acidosis without coma (HCC) 06/01/2016  . Elevated bilirubin 09/11/2016  . Essential hypertension with goal blood pressure less than 130/80 02/07/2015  . Hypercholesterolemia 02/25/2013  . Hyponatremia 09/11/2016  . Hypophosphatemia 12/14/2015  . Hypovitaminosis D 12/16/2015  . Mass of right inguinal region   . Microcytic anemia 11/17/2014  . Non-ST elevation myocardial infarction (NSTEMI) (HCC) 02/19/2013  . Poorly controlled type 1 diabetes mellitus (HCC) 03/03/2012   Overview:  Diagnosed 02/2012.  Now insulin-dependent  . Proteinuria 12/14/2015   Medications:  Medications Prior to Admission  Medication Sig Dispense Refill Last Dose  . FLUoxetine (PROZAC) 40 MG capsule Take 40 mg by mouth daily.   07/28/2017 at Unknown time  . furosemide (LASIX) 40 MG  tablet Take 1 tablet (40 mg total) by mouth 2 (two) times daily. 30 tablet 0 07/28/2017 at Unknown time  . insulin detemir (LEVEMIR) 100 UNIT/ML injection Inject 30 Units into the skin at bedtime.   07/28/2017 at Unknown time  . aspirin EC 81 MG tablet Take 1 tablet (81 mg total) by mouth daily. 150 tablet 2 07/27/2017  . atorvastatin (LIPITOR) 40 MG tablet Take 1 tablet (40 mg total) by mouth daily at 6 PM. (Patient not taking: Reported on 07/29/2017) 30 tablet 0 Not Taking at Unknown time  . hydrALAZINE (APRESOLINE) 25 MG tablet Take 1 tablet (25 mg total) by mouth every 8 (eight) hours. (Patient not taking: Reported on 07/29/2017) 90 tablet 0 Not Taking at Unknown time  . insulin aspart (NOVOLOG) 100 UNIT/ML injection Inject 5 Units into the skin 3 (three) times daily before meals. (Patient taking  differently: Inject 10 Units into the skin 3 (three) times daily with meals. ) 100 mL 0 06/08/2017 at pm  . potassium chloride SA (K-DUR,KLOR-CON) 20 MEQ tablet Take 2 tablets (40 mEq total) by mouth 2 (two) times daily. (Patient not taking: Reported on 07/29/2017) 60 tablet 0 Not Taking at Unknown time     Stormy CardKatsoudas,Arlie Posch K, [facility] Group[facility]PH 08/09/2017,8:42 AM

## 2017-08-09 NOTE — Progress Notes (Signed)
Patient ID: [name], male   DOB: April 06, 1986, 32 y.o.   MRN: [id number]  [employer] PROGRESS NOTE  Dewan Gala Lewandowsky JXB:[id number] DOB: [birth year] DOA: 07/29/2017 PCP: Oswaldo Conroy, MD  HPI/Subjective: Patient feeling better.  Urinating well.  States he still short of breath.  Some cough.  Objective: Vitals:   08/09/17 1200 08/09/17 1300  BP: (!) 161/88 (!) 162/92  Pulse: 98 95  Resp: (!) 23 (!) 28  Temp:    SpO2: 91% 100%    Intake/Output Summary (Last 24 hours) at 08/09/2017 1330 Last data filed at 08/09/2017 1300 Gross per 24 hour  Intake 324.46 ml  Output 3575 ml  Net -3250.54 ml   Filed Weights   08/07/17 0509 08/08/17 0305 08/09/17 0309  Weight: 110.1 kg (242 lb 11.2 oz) 110.1 kg (242 lb 11.6 oz) 108.6 kg (239 lb 6.7 oz)    ROS: Review of Systems  Constitutional: Negative for chills and fever.  Eyes: Negative for blurred vision.  Respiratory: Positive for cough and shortness of breath.   Cardiovascular: Negative for chest pain.  Gastrointestinal: Negative for abdominal pain, constipation, diarrhea, nausea and vomiting.  Genitourinary: Negative for dysuria.  Musculoskeletal: Negative for joint pain.  Neurological: Negative for dizziness and headaches.   Exam: Physical Exam  Constitutional: He is oriented to person, place, and time.  HENT:  Nose: No mucosal edema.  Mouth/Throat: No oropharyngeal exudate or posterior oropharyngeal edema.  Eyes: Conjunctivae, EOM and lids are normal. Pupils are equal, round, and reactive to light.  Neck: No JVD present. Carotid bruit is not present. No edema present. No thyroid mass and no thyromegaly present.  Cardiovascular: S1 normal and S2 normal. Exam reveals no gallop.  No murmur heard. Pulses:      Dorsalis pedis pulses are 2+ on the right side, and 2+ on the left side.  Respiratory: No respiratory distress. He has decreased breath sounds in the right lower field and the left lower field. He has no wheezes. He  has no rhonchi. He has rales in the right lower field and the left lower field.  GI: Soft. Bowel sounds are normal. He exhibits distension. There is no tenderness.  Musculoskeletal:       Left ankle: He exhibits swelling.  Lymphadenopathy:    He has no cervical adenopathy.  Neurological: He is alert and oriented to person, place, and time. No cranial nerve deficit.  Skin: Skin is warm. No rash noted. Nails show no clubbing.  Psychiatric: He has a normal mood and affect.      Data Reviewed: Basic Metabolic Panel: Recent Labs  Lab 08/02/17 1805 08/03/17 0453  08/04/17 0447  08/06/17 0726 08/07/17 0230 08/07/17 1518 08/08/17 0210 08/09/17 0436  NA  --  135   < > 134*   < > 132* 133* 135 134* 136  K 4.1 3.7   < > 3.6   < > 3.7 3.9 4.0 3.9 3.5  CL  --  102   < > 102   < > 98* 99* 102 100* 101  CO2  --  26   < > 26   < > 22 24 23 23 24   GLUCOSE  --  97   < > 70   < > 215* 227* 75 219* 186*  BUN  --  28*   < > 29*   < > 53* 56* 57* 60* 66*  CREATININE  --  2.04*   < > 2.53*   < > 3.56* 3.54*  3.43* 3.43* 3.44*  CALCIUM  --  8.1*   < > 7.7*   < > 7.9* 7.9* 8.3* 7.9* 8.1*  MG 2.1 2.0  --  2.2  --   --  2.4 2.5*  --   --   PHOS  --   --   --   --   --  5.4*  --  5.3*  --   --    < > = values in this interval not displayed.   Liver Function Tests: Recent Labs  Lab 08/03/17 2015 08/06/17 0726 08/07/17 1518  AST 65*  --  27  ALT 43  --  21  ALKPHOS 80  --  84  BILITOT 0.6  --  0.6  PROT 5.3*  --  5.7*  ALBUMIN 2.2* 2.4* 2.3*   CBC: Recent Labs  Lab 08/05/17 0557 08/06/17 0726 08/07/17 0230 08/07/17 1518 08/09/17 0652  WBC 10.0 12.8* 12.3* 12.3* 17.1*  HGB 6.8* 7.0* 8.5* 8.6* 9.0*  HCT 20.6* 21.4* 25.5* 26.5* 26.8*  MCV 79.9* 80.3 80.3 80.7 80.4  PLT 309 370 374 408 444*   Cardiac Enzymes: Recent Labs  Lab 08/05/17 0559 08/05/17 1200 08/07/17 1518 08/07/17 2000 08/08/17 0209  TROPONINI 0.09* 0.08* 0.05* 0.05* 0.05*   BNP (last 3 results) Recent Labs     07/22/17 1640 07/29/17 0955 08/07/17 0230  BNP 149.0* 93.0 398.0*     CBG: Recent Labs  Lab 08/08/17 2344 08/09/17 0308 08/09/17 0423 08/09/17 0734 08/09/17 1132  GLUCAP 228* 199* 187* 164* 199*    Recent Results (from the past 240 hour(s))  CULTURE, BLOOD (ROUTINE X 2) w Reflex to ID Panel     Status: None   Collection Time: 08/01/17  5:54 PM  Result Value Ref Range Status   Specimen Description BLOOD LEFT ANTECUBITAL  Final   Special Requests   Final    BOTTLES DRAWN AEROBIC AND ANAEROBIC Blood Culture results may not be optimal due to an inadequate volume of blood received in culture bottles   Culture   Final    NO GROWTH 5 DAYS Performed at [facility], [address] [place], [name] [address]    Report Status 08/06/2017 FINAL  Final  CULTURE, BLOOD (ROUTINE X 2) w Reflex to ID Panel     Status: None   Collection Time: 08/01/17  6:03 PM  Result Value Ref Range Status   Specimen Description BLOOD BLOOD RIGHT HAND  Final   Special Requests   Final    BOTTLES DRAWN AEROBIC AND ANAEROBIC Blood Culture adequate volume   Culture   Final    NO GROWTH 5 DAYS Performed at [facility], [address] [place], [name] [address]    Report Status 08/06/2017 FINAL  Final  MRSA PCR Screening     Status: None   Collection Time: 08/04/17  3:48 PM  Result Value Ref Range Status   MRSA by PCR NEGATIVE NEGATIVE Final    Comment:        The GeneXpert MRSA Assay (FDA approved for NASAL specimens only), is one component of a comprehensive MRSA colonization surveillance program. It is not intended to diagnose MRSA infection nor to guide or monitor treatment for MRSA infections. Performed at [facility], [address] [place], [name] [address]      Studies: [facility]  Result Date: 08/08/2017 CLINICAL DATA:  Pulmonary edema EXAM: PORTABLE CHEST - 1 VIEW COMPARISON:  the previous day's study  FINDINGS: Relatively low lung  volumes. Extensive bilateral patchy airspace opacities, now slightly worse on the right than left, but stable in overall severity since previous exam. Heart size upper limits normal for technique. No effusion.  No pneumothorax. Visualized bones unremarkable. IMPRESSION: 1. Persistent extensive bilateral airspace opacities. Electronically Signed   By: Corlis Leak M.D.   On: 08/08/2017 08:12    Scheduled Meds: . aspirin EC  81 mg Oral Daily  . atorvastatin  40 mg Oral q1800  . budesonide (PULMICORT) nebulizer solution  0.25 mg Nebulization BID  . carvedilol  12.5 mg Oral BID WC  . famotidine  20 mg Oral BID  . FLUoxetine  40 mg Oral Daily  . heparin injection (subcutaneous)  5,000 Units Subcutaneous Q8H  . hydrALAZINE  100 mg Oral Q8H  . insulin aspart  0-15 Units Subcutaneous Q4H  . insulin detemir  25 Units Subcutaneous QHS  . ipratropium-albuterol  3 mL Nebulization Q4H  . magnesium oxide  400 mg Oral BID  . mouth rinse  15 mL Mouth Rinse BID  . methylPREDNISolone (SOLU-MEDROL) injection  40 mg Intravenous Q12H  . sodium chloride flush  10-40 mL Intracatheter Q12H   Continuous Infusions: . sodium chloride Stopped (08/05/17 1730)  . furosemide (LASIX) infusion 10 mg/hr (08/09/17 1300)    Assessment/Plan:  1. Acute respiratory failure secondary to fluid overload.  Patient on Lasix drip and diuresing better.  Patient currently on 4 L of oxygen.  As per critical care specialist, potential out of the ICU today. 2. Acute kidney injury on chronic kidney disease stage II.  Patient's Lasix drip has been restarted.  Last creatinine 3.44.  Patient had contrast dye with CT scan on 08/01/2017. 3. Anasarca and nephrotic syndrome on IV Lasix drip 4. Type 2 diabetes mellitus.  On Levemir 25 units and NovoLog with meals.  Hemoglobin A1c very elevated blood sugars better controlled here in the hospital. 5. Hypomagnesemia and hypokalemia replaced 6. Essential hypertension.  Blood pressure elevated.  On  Norvasc Coreg and hydralazine 7. Hyperlipidemia unspecified on atorvastatin 8. Depression on fluoxetine 9. Healthcare associated pneumonia on Zosyn 10. Anemia of chronic disease.  Continue to follow hemoglobin.  Code Status:     Code Status Orders  (From admission, onward)        Start     Ordered   07/29/17 1356  Full code  Continuous     07/29/17 1355    Code Status History    Date Active Date Inactive Code Status Order ID Comments User Context   06/09/2017 15:22 06/12/2017 17:22 Full Code [id number]  Ramonita Lab, MD Inpatient   04/14/2017 14:05 04/18/2017 20:05 Full Code [id number]  Shaune Pollack, MD Inpatient   09/09/2016 19:55 09/11/2016 18:59 Full Code [id number]  Altamese Dilling, MD Inpatient   06/01/2016 19:26 06/02/2016 18:35 Full Code [id number]  Ramonita Lab, MD Inpatient     Family Communication: As per critical care team Disposition Plan: To be determined   Consultants:  Critical care specialist  Nephrology  Psychiatry  Antibiotics:  Zosyn  Time spent: 22 minutes, care discussed with critical care specialist.  Alford Highland  [employer]

## 2017-08-09 NOTE — Plan of Care (Signed)
Patient with some shortness of breath at rest over night. Bipap in place since PM. Lasix drip infusing per order. Patient voiding with urinal at bedside. Patient is alert and oriented with no complaints of pain at present. Patient resting in bed with no signs of acute distress at present. Will continue to monitor.     Education: Knowledge of General Education information will improve 08/09/2017 0546 - Progressing by Hollice Espyoberson, Wanda Cellucci V, RN Spiritual Needs Ability to function at adequate level 08/09/2017 0546 - Progressing by Hollice Espyoberson, Leighla Chestnutt V, RN

## 2017-08-09 NOTE — Progress Notes (Signed)
Patient was transferred from ICU to room 149. Lasix drip infusing at 10 cc/hr. A&O x4. Oriented patient to room, call light, TV, and bed controls.

## 2017-08-09 NOTE — Progress Notes (Signed)
[facility]  ROUNDING NOTE   Subjective:   UOP  2300  Furosemide gtt 10mg /hr  Creatinine 3.44 (3.43)  Objective:  Vital signs in last 24 hours:  Temp:  [98.1 F (36.7 C)-98.8 F (37.1 C)] 98.8 F (37.1 C) (02/02 0800) Pulse Rate:  [94-102] 95 (02/02 0800) Resp:  [0-32] 17 (02/02 0800) BP: (98-170)/(46-116) 148/79 (02/02 0800) SpO2:  [89 %-100 %] 92 % (02/02 0808) FiO2 (%):  [45 %] 45 % (02/02 0500) Weight:  [108.6 kg (239 lb 6.7 oz)] 108.6 kg (239 lb 6.7 oz) (02/02 0309)  Weight change: -1.5 kg (-4.9 oz) Filed Weights   08/07/17 0509 08/08/17 0305 08/09/17 0309  Weight: 110.1 kg (242 lb 11.2 oz) 110.1 kg (242 lb 11.6 oz) 108.6 kg (239 lb 6.7 oz)    Intake/Output: I/O last 3 completed shifts: In: 419.1 [I.V.:319.1; IV Piggyback:100] Out: 4375 [Urine:4375]   Intake/Output this shift:  Total I/O In: 40 [I.V.:40] Out: 675 [Urine:675]  Physical Exam: General: No acute distress  Head: Normocephalic, atraumatic. Moist oral mucosal membranes  Eyes: Anicteric  Neck: Supple, trachea midline  Lungs:  Crackles bilaterally  Heart: S1S2 no rubs  Abdomen:  Soft, nontender, bowel sounds present  Extremities: + edema, Right AKA  Neurologic: Awake, alert, following commands  Skin: Venous stasis changes LLE       Basic Metabolic Panel: Recent Labs  Lab 08/02/17 1805 08/03/17 0453  08/04/17 0447  08/06/17 0726 08/07/17 0230 08/07/17 1518 08/08/17 0210 08/09/17 0436  NA  --  135   < > 134*   < > 132* 133* 135 134* 136  K 4.1 3.7   < > 3.6   < > 3.7 3.9 4.0 3.9 3.5  CL  --  102   < > 102   < > 98* 99* 102 100* 101  CO2  --  26   < > 26   < > 22 24 23 23 24   GLUCOSE  --  97   < > 70   < > 215* 227* 75 219* 186*  BUN  --  28*   < > 29*   < > 53* 56* 57* 60* 66*  CREATININE  --  2.04*   < > 2.53*   < > 3.56* 3.54* 3.43* 3.43* 3.44*  CALCIUM  --  8.1*   < > 7.7*   < > 7.9* 7.9* 8.3* 7.9* 8.1*  MG 2.1 2.0  --  2.2  --   --  2.4 2.5*  --   --   PHOS  --    --   --   --   --  5.4*  --  5.3*  --   --    < > = values in this interval not displayed.    Liver Function Tests: Recent Labs  Lab 08/03/17 2015 08/06/17 0726 08/07/17 1518  AST 65*  --  27  ALT 43  --  21  ALKPHOS 80  --  84  BILITOT 0.6  --  0.6  PROT 5.3*  --  5.7*  ALBUMIN 2.2* 2.4* 2.3*   No results for input(s): LIPASE, AMYLASE in the last 168 hours. No results for input(s): AMMONIA in the last 168 hours.  CBC: Recent Labs  Lab 08/05/17 0557 08/06/17 0726 08/07/17 0230 08/07/17 1518 08/09/17 0652  WBC 10.0 12.8* 12.3* 12.3* 17.1*  HGB 6.8* 7.0* 8.5* 8.6* 9.0*  HCT 20.6* 21.4* 25.5* 26.5* 26.8*  MCV 79.9* 80.3 80.3 80.7 80.4  PLT 309 370 374 408 444*    Cardiac Enzymes: Recent Labs  Lab 08/05/17 0559 08/05/17 1200 08/07/17 1518 08/07/17 2000 08/08/17 0209  TROPONINI 0.09* 0.08* 0.05* 0.05* 0.05*    BNP: Invalid input(s): POCBNP  CBG: Recent Labs  Lab 08/08/17 1934 08/08/17 2344 08/09/17 0308 08/09/17 0423 08/09/17 0734  GLUCAP 224* 228* 199* 187* 164*    Microbiology: Results for orders placed or performed during the hospital encounter of 07/29/17  CULTURE, BLOOD (ROUTINE X 2) w Reflex to ID Panel     Status: None   Collection Time: 08/01/17  5:54 PM  Result Value Ref Range Status   Specimen Description BLOOD LEFT ANTECUBITAL  Final   Special Requests   Final    BOTTLES DRAWN AEROBIC AND ANAEROBIC Blood Culture results may not be optimal due to an inadequate volume of blood received in culture bottles   Culture   Final    NO GROWTH 5 DAYS Performed at [facility], [address] [place], [name] [address]    Report Status 08/06/2017 FINAL  Final  CULTURE, BLOOD (ROUTINE X 2) w Reflex to ID Panel     Status: None   Collection Time: 08/01/17  6:03 PM  Result Value Ref Range Status   Specimen Description BLOOD BLOOD RIGHT HAND  Final   Special Requests   Final    BOTTLES DRAWN AEROBIC AND ANAEROBIC Blood Culture adequate  volume   Culture   Final    NO GROWTH 5 DAYS Performed at [facility], [address] [place], [name] [address]    Report Status 08/06/2017 FINAL  Final  MRSA PCR Screening     Status: None   Collection Time: 08/04/17  3:48 PM  Result Value Ref Range Status   MRSA by PCR NEGATIVE NEGATIVE Final    Comment:        The GeneXpert MRSA Assay (FDA approved for NASAL specimens only), is one component of a comprehensive MRSA colonization surveillance program. It is not intended to diagnose MRSA infection nor to guide or monitor treatment for MRSA infections. Performed at [facility], [address] [place], [name] [address]     Coagulation Studies: No results for input(s): LABPROT, INR in the last 72 hours.  Urinalysis: No results for input(s): COLORURINE, LABSPEC, PHURINE, GLUCOSEU, HGBUR, BILIRUBINUR, KETONESUR, PROTEINUR, UROBILINOGEN, NITRITE, LEUKOCYTESUR in the last 72 hours.  Invalid input(s): APPERANCEUR    Imaging: Dg Chest Port 1 View  Result Date: 08/08/2017 CLINICAL DATA:  Pulmonary edema EXAM: PORTABLE CHEST - 1 VIEW COMPARISON:  the previous day's study FINDINGS: Relatively low lung volumes. Extensive bilateral patchy airspace opacities, now slightly worse on the right than left, but stable in overall severity since previous exam. Heart size upper limits normal for technique. No effusion.  No pneumothorax. Visualized bones unremarkable. IMPRESSION: 1. Persistent extensive bilateral airspace opacities. Electronically Signed   By: Corlis Leak  Hassell M.D.   On: 08/08/2017 08:12   Dg Chest Port 1 View  Result Date: 08/07/2017 CLINICAL DATA:  Shortness of breath. EXAM: PORTABLE CHEST 1 VIEW COMPARISON:  08/03/2017. FINDINGS: Cardiomegaly. Diffuse progressive bilateral pulmonary infiltrates/edema. Findings are consistent with progressive CHF and/or pneumonia. No pleural effusion or pneumothorax. IMPRESSION: Cardiomegaly with diffuse progressive bilateral  pulmonary infiltrates/edema. Findings are consistent with progressive CHF and/or pneumonia. Electronically Signed   By: Maisie Fushomas  Register   On: 08/07/2017 10:43     Medications:   . sodium chloride Stopped (08/05/17 1730)  . furosemide (LASIX) infusion  10 mg/hr (08/09/17 0800)   . aspirin EC  81 mg Oral Daily  . atorvastatin  40 mg Oral q1800  . budesonide (PULMICORT) nebulizer solution  0.25 mg Nebulization BID  . carvedilol  12.5 mg Oral BID WC  . famotidine  20 mg Oral BID  . FLUoxetine  40 mg Oral Daily  . heparin injection (subcutaneous)  5,000 Units Subcutaneous Q8H  . hydrALAZINE  100 mg Oral Q8H  . insulin aspart  0-15 Units Subcutaneous Q4H  . insulin detemir  25 Units Subcutaneous QHS  . ipratropium-albuterol  3 mL Nebulization Q4H  . magnesium oxide  400 mg Oral BID  . mouth rinse  15 mL Mouth Rinse BID  . methylPREDNISolone (SOLU-MEDROL) injection  40 mg Intravenous Q12H  . sodium chloride flush  10-40 mL Intracatheter Q12H   acetaminophen, alum & mag hydroxide-simeth, docusate sodium, ipratropium-albuterol, morphine injection, sodium chloride, sodium chloride flush  Assessment/ Plan:  32 y.o. black male with Diabetes mellitus type 1, hypertension, coronary artery disease, peripheral vascular disease status post right above-the-knee amputation, hyperlipidemiawho was admitted to [facility] on 1/22/19for evaluation of severe anasarca.   1. 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  Negative serologic work up.  - Acute renal failure from contrast exposure. Creatinine plateaued. Nonoliguric urine output, responding to IV furosemide gtt.  - Placed on furosemide gtt. Continue at 10mg /hr for another 24 hours  2. Hypertension: with anasarca/edema: History of angioedema with lisinopril. Echocardigram with Grade 2 Diastolic  Dysfunction.  - discontinued IV albumin due to pulmonary edema - furosemide gtt.   - Continue hydralazine, and carvedilol  3. Acute respiratory failure: from pulmonary edema. Status post 2 units PRBC and IV albumin and renal failure and pneumonia.  - noninvasive ventilation.  - empiric antibiotics and systemic steroids.   4. Diabetes mellitus type I with chronic kidney disease: poor control. Hemoglobin A1c of 14.1% on 06/10/17.  - Continue glucose control.   5. Anemia with kidney failure: status post PRBC transfusion.    LOS: 11 Jamori Biggar 2/2/20199:29 AM

## 2017-08-10 LAB — GLUCOSE, CAPILLARY
GLUCOSE-CAPILLARY: 140 mg/dL — AB (ref 65–99)
GLUCOSE-CAPILLARY: 173 mg/dL — AB (ref 65–99)
GLUCOSE-CAPILLARY: 180 mg/dL — AB (ref 65–99)
GLUCOSE-CAPILLARY: 277 mg/dL — AB (ref 65–99)
Glucose-Capillary: 168 mg/dL — ABNORMAL HIGH (ref 65–99)
Glucose-Capillary: 219 mg/dL — ABNORMAL HIGH (ref 65–99)
Glucose-Capillary: 338 mg/dL — ABNORMAL HIGH (ref 65–99)

## 2017-08-10 LAB — CBC
HCT: 27.3 % — ABNORMAL LOW (ref 40.0–52.0)
Hemoglobin: 9 g/dL — ABNORMAL LOW (ref 13.0–18.0)
MCH: 26.6 pg (ref 26.0–34.0)
MCHC: 33 g/dL (ref 32.0–36.0)
MCV: 80.6 fL (ref 80.0–100.0)
PLATELETS: 446 10*3/uL — AB (ref 150–440)
RBC: 3.38 MIL/uL — ABNORMAL LOW (ref 4.40–5.90)
RDW: 15.4 % — ABNORMAL HIGH (ref 11.5–14.5)
WBC: 13.4 10*3/uL — ABNORMAL HIGH (ref 3.8–10.6)

## 2017-08-10 LAB — BASIC METABOLIC PANEL
ANION GAP: 11 (ref 5–15)
BUN: 72 mg/dL — ABNORMAL HIGH (ref 6–20)
CHLORIDE: 99 mmol/L — AB (ref 101–111)
CO2: 25 mmol/L (ref 22–32)
CREATININE: 3.25 mg/dL — AB (ref 0.61–1.24)
Calcium: 8.3 mg/dL — ABNORMAL LOW (ref 8.9–10.3)
GFR calc non Af Amer: 23 mL/min — ABNORMAL LOW (ref >60)
GFR, EST AFRICAN AMERICAN: 27 mL/min — AB (ref >60)
Glucose, Bld: 179 mg/dL — ABNORMAL HIGH (ref 65–99)
POTASSIUM: 3.8 mmol/L (ref 3.5–5.1)
SODIUM: 135 mmol/L (ref 135–145)

## 2017-08-10 MED ORDER — FUROSEMIDE 10 MG/ML IJ SOLN
80.0000 mg | Freq: Two times a day (BID) | INTRAMUSCULAR | Status: AC
  Administered 2017-08-10 – 2017-08-11 (×4): 80 mg via INTRAVENOUS
  Filled 2017-08-10 (×5): qty 8

## 2017-08-10 NOTE — Progress Notes (Signed)
[facility]  ROUNDING NOTE   Subjective:   UOP  5575  Furosemide gtt 10mg /hr  Creatinine 3.25 (3.44) (3.43)  Objective:  Vital signs in last 24 hours:  Temp:  [98.2 F (36.8 C)-98.6 F (37 C)] 98.2 F (36.8 C) (02/03 0812) Pulse Rate:  [94-98] 94 (02/03 0812) Resp:  [18-28] 18 (02/03 0812) BP: (161-178)/(87-95) 177/95 (02/03 0812) SpO2:  [91 %-100 %] 99 % (02/03 0812) FiO2 (%):  [45 %] 45 % (02/03 0240) Weight:  [106.1 kg (233 lb 12.8 oz)] 106.1 kg (233 lb 12.8 oz) (02/03 0424)  Weight change: -2.549 kg (-9.9 oz) Filed Weights   08/08/17 0305 08/09/17 0309 08/10/17 0424  Weight: 110.1 kg (242 lb 11.6 oz) 108.6 kg (239 lb 6.7 oz) 106.1 kg (233 lb 12.8 oz)    Intake/Output: I/O last 3 completed shifts: In: 1052.3 [P.O.:840; I.V.:212.3] Out: 6625 [Urine:6625]   Intake/Output this shift:  Total I/O In: 480 [P.O.:480] Out: 1200 [Urine:1200]  Physical Exam: General: No acute distress  Head: Normocephalic, atraumatic. Moist oral mucosal membranes  Eyes: Anicteric  Neck: Supple, trachea midline  Lungs:  clear  Heart: S1S2 no rubs  Abdomen:  Soft, nontender, bowel sounds present  Extremities: trace edema, Right AKA  Neurologic: Awake, alert, following commands  Skin: Venous stasis changes LLE       Basic Metabolic Panel: Recent Labs  Lab 08/04/17 0447  08/06/17 0726 08/07/17 0230 08/07/17 1518 08/08/17 0210 08/09/17 0436 08/10/17 0406  NA 134*   < > 132* 133* 135 134* 136 135  K 3.6   < > 3.7 3.9 4.0 3.9 3.5 3.8  CL 102   < > 98* 99* 102 100* 101 99*  CO2 26   < > 22 24 23 23 24 25   GLUCOSE 70   < > 215* 227* 75 219* 186* 179*  BUN 29*   < > 53* 56* 57* 60* 66* 72*  CREATININE 2.53*   < > 3.56* 3.54* 3.43* 3.43* 3.44* 3.25*  CALCIUM 7.7*   < > 7.9* 7.9* 8.3* 7.9* 8.1* 8.3*  MG 2.2  --   --  2.4 2.5*  --   --   --   PHOS  --   --  5.4*  --  5.3*  --   --   --    < > = values in this interval not displayed.    Liver Function  Tests: Recent Labs  Lab 08/03/17 2015 08/06/17 0726 08/07/17 1518  AST 65*  --  27  ALT 43  --  21  ALKPHOS 80  --  84  BILITOT 0.6  --  0.6  PROT 5.3*  --  5.7*  ALBUMIN 2.2* 2.4* 2.3*   No results for input(s): LIPASE, AMYLASE in the last 168 hours. No results for input(s): AMMONIA in the last 168 hours.  CBC: Recent Labs  Lab 08/06/17 0726 08/07/17 0230 08/07/17 1518 08/09/17 0652 08/10/17 0406  WBC 12.8* 12.3* 12.3* 17.1* 13.4*  HGB 7.0* 8.5* 8.6* 9.0* 9.0*  HCT 21.4* 25.5* 26.5* 26.8* 27.3*  MCV 80.3 80.3 80.7 80.4 80.6  PLT 370 374 408 444* 446*    Cardiac Enzymes: Recent Labs  Lab 08/05/17 0559 08/05/17 1200 08/07/17 1518 08/07/17 2000 08/08/17 0209  TROPONINI 0.09* 0.08* 0.05* 0.05* 0.05*    BNP: Invalid input(s): POCBNP  CBG: Recent Labs  Lab 08/09/17 2101 08/10/17 0003 08/10/17 0421 08/10/17 0516 08/10/17 0718  GLUCAP 242* 218* 173* 180* 140*  Microbiology: Results for orders placed or performed during the hospital encounter of 07/29/17  CULTURE, BLOOD (ROUTINE X 2) w Reflex to ID Panel     Status: None   Collection Time: 08/01/17  5:54 PM  Result Value Ref Range Status   Specimen Description BLOOD LEFT ANTECUBITAL  Final   Special Requests   Final    BOTTLES DRAWN AEROBIC AND ANAEROBIC Blood Culture results may not be optimal due to an inadequate volume of blood received in culture bottles   Culture   Final    NO GROWTH 5 DAYS Performed at [facility], [address] [place], [name] [address]    Report Status 08/06/2017 FINAL  Final  CULTURE, BLOOD (ROUTINE X 2) w Reflex to ID Panel     Status: None   Collection Time: 08/01/17  6:03 PM  Result Value Ref Range Status   Specimen Description BLOOD BLOOD RIGHT HAND  Final   Special Requests   Final    BOTTLES DRAWN AEROBIC AND ANAEROBIC Blood Culture adequate volume   Culture   Final    NO GROWTH 5 DAYS Performed at [facility], [address]  [place], [name] [address]    Report Status 08/06/2017 FINAL  Final  MRSA PCR Screening     Status: None   Collection Time: 08/04/17  3:48 PM  Result Value Ref Range Status   MRSA by PCR NEGATIVE NEGATIVE Final    Comment:        The GeneXpert MRSA Assay (FDA approved for NASAL specimens only), is one component of a comprehensive MRSA colonization surveillance program. It is not intended to diagnose MRSA infection nor to guide or monitor treatment for MRSA infections. Performed at [facility], [address] [place], [name] [address]     Coagulation Studies: No results for input(s): LABPROT, INR in the last 72 hours.  Urinalysis: No results for input(s): COLORURINE, LABSPEC, PHURINE, GLUCOSEU, HGBUR, BILIRUBINUR, KETONESUR, PROTEINUR, UROBILINOGEN, NITRITE, LEUKOCYTESUR in the last 72 hours.  Invalid input(s): APPERANCEUR    Imaging: No results found.   Medications:   . sodium chloride Stopped (08/05/17 1730)   . aspirin EC  81 mg Oral Daily  . atorvastatin  40 mg Oral q1800  . budesonide (PULMICORT) nebulizer solution  0.25 mg Nebulization BID  . carvedilol  12.5 mg Oral BID WC  . famotidine  20 mg Oral BID  . FLUoxetine  40 mg Oral Daily  . furosemide  80 mg Intravenous Q12H  . heparin injection (subcutaneous)  5,000 Units Subcutaneous Q8H  . hydrALAZINE  100 mg Oral Q8H  . insulin aspart  0-15 Units Subcutaneous Q4H  . insulin detemir  25 Units Subcutaneous QHS  . ipratropium-albuterol  3 mL Nebulization Q6H  . magnesium oxide  400 mg Oral BID  . mouth rinse  15 mL Mouth Rinse BID  . methylPREDNISolone (SOLU-MEDROL) injection  40 mg Intravenous Q12H  . sodium chloride flush  10-40 mL Intracatheter Q12H   acetaminophen, alum & mag hydroxide-simeth, docusate sodium, ipratropium-albuterol, morphine injection, sodium chloride, sodium chloride flush  Assessment/ Plan:  32 y.o. black male with Diabetes mellitus type 1, hypertension, coronary artery  disease, peripheral vascular disease status post right above-the-knee amputation, hyperlipidemiawho [facility] severe [facility]asarca.   1. 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  Electrolytes improved.  Serologic work up from 04/15/17 negative.  - Acute renal failure from contrast exposure. Creatinine plateaued. Nonoliguric urine output, responding to IV furosemide gtt.  - Transition from furosemide drip to IV furosemide bolus.   2. Hypertension: with anasarca/edema: History of angioedema with lisinopril. Echocardigram with Grade 2 Diastolic Dysfunction.  - discontinued IV albumin due to pulmonary edema - furosemide as above.  - Continue hydralazine, and carvedilol  3. Acute respiratory failure: from pulmonary edema. Status post 2 units PRBC and IV albumin and renal failure and pneumonia.  - Back on Nasal Canula.  - empiric antibiotics and systemic steroids.   4. Diabetes mellitus type I with chronic kidney disease: poor control. Hemoglobin A1c of 14.1% on 06/10/17.  - Continue glucose control.   5. Anemia with kidney failure: status post PRBC transfusion.    LOS: 12 Tonio Seider 2/3/201911:31 AM

## 2017-08-10 NOTE — Progress Notes (Signed)
MEDICATION RELATED CONSULT NOTE  Pharmacy Consult for electrolyte/glucose/constipation management.  Assessment: 32 yo M admitted to [facility] [place] on 1/22 for evaluation of severe anasarca. Transferred to ICU 1/31 with acute respiratory failure from pulmonary edema and placed on BiPAP. Patient also in acute renal failure with hyponatremia and hypokalemia and CKD stage II. Patient transferred to 1A on 2/2.   Plan:  1. No electrolyte replacement is indicated at this time. Will recheck with AM labs.   2. Per diabetes coordinator note 2/1 Will decrease Novolog correction to moderate 0-15 units q4h. Continue Levemir 25 units qHS.  3. Patient has had regular bowel movements throughout admission with last bowl movement 1/31. Will continue to monitor for appropriateness of bowel regimen.  Pharmacy will continue to monitor per consult.  Allergies  Allergen Reactions  . Lisinopril Swelling    Patient Measurements: Height: 5\' 6"  (167.6 cm) Weight: 233 lb 12.8 oz (106.1 kg) IBW/kg (Calculated) : 63.8  Vital Signs: Temp: 98.6 F (37 C) (02/03 0422) Temp Source: Axillary (02/03 0422) BP: 178/87 (02/03 0422) Pulse Rate: 96 (02/03 0422) Intake/Output from previous day: 02/02 0701 - 02/03 0700 In: 940 [P.O.:840; I.V.:100] Out: 5575 [Urine:5575] Intake/Output from this shift: No intake/output data recorded.  Labs: Recent Labs    08/07/17 1518 08/08/17 0210 08/09/17 0436 08/09/17 0652 08/10/17 0406  WBC 12.3*  --   --  17.1* 13.4*  HGB 8.6*  --   --  9.0* 9.0*  HCT 26.5*  --   --  26.8* 27.3*  PLT 408  --   --  444* 446*  CREATININE 3.43* 3.43* 3.44*  --  3.25*  MG 2.5*  --   --   --   --   PHOS 5.3*  --   --   --   --   ALBUMIN 2.3*  --   --   --   --   PROT 5.7*  --   --   --   --   AST 27  --   --   --   --   ALT 21  --   --   --   --   ALKPHOS 84  --   --   --   --   BILITOT 0.6  --   --   --   --    Estimated Creatinine Clearance: 36.9 mL/min (A) (by C-G formula based on SCr  of 3.25 mg/dL (H)).   Medical History: Past Medical History:  Diagnosis Date  . Abscess of groin, right 09/09/2016  . 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 Acute gastroenteritis 09/11/2016  . AKI (acute kidney injury) (HCC) 02/19/2013  . Anemia 12/14/2015  . Dehydration 09/11/2016  . Diabetes mellitus without complication (HCC)   . Diabetic acidosis without coma (HCC) 06/01/2016  . Elevated bilirubin 09/11/2016  . Essential hypertension with goal blood pressure less than 130/80 02/07/2015  . Hypercholesterolemia 02/25/2013  . Hyponatremia 09/11/2016  . Hypophosphatemia 12/14/2015  . Hypovitaminosis D 12/16/2015  . Mass of right inguinal region   . Microcytic anemia 11/17/2014  . Non-ST elevation myocardial infarction (NSTEMI) (HCC) 02/19/2013  . Poorly controlled type 1 diabetes mellitus (HCC) 03/03/2012   Overview:  Diagnosed 02/2012.  Now insulin-dependent  . Proteinuria 12/14/2015   Medications:  Medications Prior to Admission  Medication Sig Dispense Refill Last Dose  . FLUoxetine (PROZAC) 40 MG capsule Take 40 mg by mouth daily.   07/28/2017 at Unknown time  .  furosemide (LASIX) 40 MG tablet Take 1 tablet (40 mg total) by mouth 2 (two) times daily. 30 tablet 0 07/28/2017 at Unknown time  . insulin detemir (LEVEMIR) 100 UNIT/ML injection Inject 30 Units into the skin at bedtime.   07/28/2017 at Unknown time  . aspirin EC 81 MG tablet Take 1 tablet (81 mg total) by mouth daily. 150 tablet 2 07/27/2017  . atorvastatin (LIPITOR) 40 MG tablet Take 1 tablet (40 mg total) by mouth daily at 6 PM. (Patient not taking: Reported on 07/29/2017) 30 tablet 0 Not Taking at Unknown time  . hydrALAZINE (APRESOLINE) 25 MG tablet Take 1 tablet (25 mg total) by mouth every 8 (eight) hours. (Patient not taking: Reported on 07/29/2017) 90 tablet 0 Not Taking at Unknown time  . insulin aspart (NOVOLOG) 100 UNIT/ML injection Inject 5 Units into the skin 3 (three) times daily before meals.  (Patient taking differently: Inject 10 Units into the skin 3 (three) times daily with meals. ) 100 mL 0 06/08/2017 at pm  . potassium chloride SA (K-DUR,KLOR-CON) 20 MEQ tablet Take 2 tablets (40 mEq total) by mouth 2 (two) times daily. (Patient not taking: Reported on 07/29/2017) 60 tablet 0 Not Taking at Unknown time     Stormy CardKatsoudas,Alyx Gee K, [facility] 08/10/2017,8:00 AM

## 2017-08-10 NOTE — Progress Notes (Signed)
Patient ID: [name], male   DOB: August 01, 1985, 32 y.o.   MRN: [id number]  Patient ID: [name], male   DOB: Aug 25, 1985, 32 y.o.   MRN: [id number]  [employer] PROGRESS NOTE  [name] [id number] DOB: 12/27/1985 DOA: 07/29/2017 PCP: Oswaldo Conroy, MD  HPI/Subjective: Patient feeling better.  Urinating well.  States he still short of breath.  Some cough.  Objective: Vitals:   08/10/17 1406 08/10/17 1423  BP: (!) 167/86   Pulse: 100 99  Resp: 16   Temp: 98.3 F (36.8 C)   SpO2: (!) 88% 92%    Intake/Output Summary (Last 24 hours) at 08/10/2017 1707 Last data filed at 08/10/2017 1638 Gross per 24 hour  Intake 1800 ml  Output 6350 ml  Net -4550 ml   Filed Weights   08/08/17 0305 08/09/17 0309 08/10/17 0424  Weight: 110.1 kg (242 lb 11.6 oz) 108.6 kg (239 lb 6.7 oz) 106.1 kg (233 lb 12.8 oz)    ROS: Review of Systems  Constitutional: Negative for chills and fever.  Eyes: Negative for blurred vision.  Respiratory: Positive for cough and shortness of breath.   Cardiovascular: Negative for chest pain.  Gastrointestinal: Negative for abdominal pain, constipation, diarrhea, nausea and vomiting.  Genitourinary: Negative for dysuria.  Musculoskeletal: Negative for joint pain.  Neurological: Negative for dizziness and headaches.   Exam: Physical Exam  Constitutional: He is oriented to person, place, and time.  HENT:  Nose: No mucosal edema.  Mouth/Throat: No oropharyngeal exudate or posterior oropharyngeal edema.  Eyes: Conjunctivae, EOM and lids are normal. Pupils are equal, round, and reactive to light.  Neck: No JVD present. Carotid bruit is not present. No edema present. No thyroid mass and no thyromegaly present.  Cardiovascular: S1 normal and S2 normal. Exam reveals no gallop.  No murmur heard. Pulses:      Dorsalis pedis pulses are 2+ on the right side, and 2+ on the left side.  Respiratory: No respiratory distress. He has decreased breath sounds  in the right lower field and the left lower field. He has no wheezes. He has no rhonchi. He has rales in the right lower field and the left lower field.  GI: Soft. Bowel sounds are normal. He exhibits distension. There is no tenderness.  Musculoskeletal:       Left ankle: He exhibits swelling.  Lymphadenopathy:    He has no cervical adenopathy.  Neurological: He is alert and oriented to person, place, and time. No cranial nerve deficit.  Skin: Skin is warm. No rash noted. Nails show no clubbing.  Psychiatric: He has a normal mood and affect.      Data Reviewed: Basic Metabolic Panel: Recent Labs  Lab 08/04/17 0447  08/06/17 0726 08/07/17 0230 08/07/17 1518 08/08/17 0210 08/09/17 0436 08/10/17 0406  NA 134*   < > 132* 133* 135 134* 136 135  K 3.6   < > 3.7 3.9 4.0 3.9 3.5 3.8  CL 102   < > 98* 99* 102 100* 101 99*  CO2 26   < > 22 24 23 23 24 25   GLUCOSE 70   < > 215* 227* 75 219* 186* 179*  BUN 29*   < > 53* 56* 57* 60* 66* 72*  CREATININE 2.53*   < > 3.56* 3.54* 3.43* 3.43* 3.44* 3.25*  CALCIUM 7.7*   < > 7.9* 7.9* 8.3* 7.9* 8.1* 8.3*  MG 2.2  --   --  2.4 2.5*  --   --   --  PHOS  --   --  5.4*  --  5.3*  --   --   --    < > = values in this interval not displayed.   Liver Function Tests: Recent Labs  Lab 08/03/17 2015 08/06/17 0726 08/07/17 1518  AST 65*  --  27  ALT 43  --  21  ALKPHOS 80  --  84  BILITOT 0.6  --  0.6  PROT 5.3*  --  5.7*  ALBUMIN 2.2* 2.4* 2.3*   CBC: Recent Labs  Lab 08/06/17 0726 08/07/17 0230 08/07/17 1518 08/09/17 0652 08/10/17 0406  WBC 12.8* 12.3* 12.3* 17.1* 13.4*  HGB 7.0* 8.5* 8.6* 9.0* 9.0*  HCT 21.4* 25.5* 26.5* 26.8* 27.3*  MCV 80.3 80.3 80.7 80.4 80.6  PLT 370 374 408 444* 446*   Cardiac Enzymes: Recent Labs  Lab 08/05/17 0559 08/05/17 1200 08/07/17 1518 08/07/17 2000 08/08/17 0209  TROPONINI 0.09* 0.08* 0.05* 0.05* 0.05*   BNP (last 3 results) Recent Labs    07/22/17 1640 07/29/17 0955 08/07/17 0230   BNP 149.0* 93.0 398.0*     CBG: Recent Labs  Lab 08/10/17 0421 08/10/17 0516 08/10/17 0718 08/10/17 1153 08/10/17 1659  GLUCAP 173* 180* 140* 168* 219*    Recent Results (from the past 240 hour(s))  CULTURE, BLOOD (ROUTINE X 2) w Reflex to ID Panel     Status: None   Collection Time: 08/01/17  5:54 PM  Result Value Ref Range Status   Specimen Description BLOOD LEFT ANTECUBITAL  Final   Special Requests   Final    BOTTLES DRAWN AEROBIC AND ANAEROBIC Blood Culture results may not be optimal due to an inadequate volume of blood received in culture bottles   Culture   Final    NO GROWTH 5 DAYS Performed at [facility], [address] [place], [place] [address]    Report Status 08/06/2017 FINAL  Final  CULTURE, BLOOD (ROUTINE X 2) w Reflex to ID Panel     Status: None   Collection Time: 08/01/17  6:03 PM  Result Value Ref Range Status   Specimen Description BLOOD BLOOD RIGHT HAND  Final   Special Requests   Final    BOTTLES DRAWN AEROBIC AND ANAEROBIC Blood Culture adequate volume   Culture   Final    NO GROWTH 5 DAYS Performed at [facility], [address] [place], [place] [address]    Report Status 08/06/2017 FINAL  Final  MRSA PCR Screening     Status: None   Collection Time: 08/04/17  3:48 PM  Result Value Ref Range Status   MRSA by PCR NEGATIVE NEGATIVE Final    Comment:        The GeneXpert MRSA Assay (FDA approved for NASAL specimens only), is one component of a comprehensive MRSA colonization surveillance program. It is not intended to diagnose MRSA infection nor to guide or monitor treatment for MRSA infections. Performed at [facility], [address] [place], [place] [address]      Studies: No results found.  Scheduled Meds: . aspirin EC  81 mg Oral Daily  . atorvastatin  40 mg Oral q1800  . budesonide (PULMICORT) nebulizer solution  0.25 mg Nebulization BID  . carvedilol  12.5 mg Oral BID WC  . famotidine   20 mg Oral BID  . FLUoxetine  40 mg Oral Daily  . furosemide  80 mg Intravenous Q12H  . heparin injection (subcutaneous)  5,000 Units Subcutaneous Q8H  .  hydrALAZINE  100 mg Oral Q8H  . insulin aspart  0-15 Units Subcutaneous Q4H  . insulin detemir  25 Units Subcutaneous QHS  . ipratropium-albuterol  3 mL Nebulization Q6H  . magnesium oxide  400 mg Oral BID  . mouth rinse  15 mL Mouth Rinse BID  . methylPREDNISolone (SOLU-MEDROL) injection  40 mg Intravenous Q12H  . sodium chloride flush  10-40 mL Intracatheter Q12H   Continuous Infusions: . sodium chloride Stopped (08/05/17 1730)    Assessment/Plan:  1. Acute respiratory failure secondary to fluid overload.  Patient currently on 2 L of oxygen.  Nephrology switched from IV Lasix drip to IV Lasix twice daily 2. Acute kidney injury on chronic kidney disease stage II.  Last creatinine 3.25.  Patient had contrast dye with CT scan on 08/01/2017. 3. Anasarca and nephrotic syndrome.  Nephrology changed from IV Lasix drip to twice daily Lasix IV 4. Type 2 diabetes mellitus.  On Levemir 25 units and NovoLog with meals.  Hemoglobin A1c very elevated blood sugars better controlled here in the hospital. 5. Hypomagnesemia and hypokalemia.  Replace potassium again today 6. Essential hypertension.  Blood pressure elevated.  On Norvasc Coreg and hydralazine 7. Hyperlipidemia unspecified on atorvastatin 8. Depression on fluoxetine 9. Healthcare associated pneumonia on Zosyn 10. Anemia of chronic disease.  Continue to follow hemoglobin.  Today's hemoglobin up at 9.0 11. Weakness.  Physical therapy evaluation.  Code Status:     Code Status Orders  (From admission, onward)        Start     Ordered   07/29/17 1356  Full code  Continuous     07/29/17 1355    Code Status History    Date Active Date Inactive Code Status Order ID Comments User Context   06/09/2017 15:22 06/12/2017 17:22 Full Code [id number]  Ramonita LabGouru, Aruna, MD Inpatient   04/14/2017  14:05 04/18/2017 20:05 Full Code [id number]  Shaune Pollackhen, Qing, MD Inpatient   09/09/2016 19:55 09/11/2016 18:59 Full Code [id number]  Altamese DillingVachhani, Vaibhavkumar, MD Inpatient   06/01/2016 19:26 06/02/2016 18:35 Full Code [id number]  Ramonita LabGouru, Aruna, MD Inpatient     Disposition Plan: To be determined   Consultants:  Nephrology  Psychiatry  Antibiotics:  Zosyn  Time spent: 22 minutes.  Upton Russey [employer]  [employer]

## 2017-08-10 NOTE — Progress Notes (Signed)
Pt placed on [facility] V-60 BIPAP for sleep. BIPAP plugged into red outlet.

## 2017-08-10 NOTE — Plan of Care (Signed)
  Progressing Education: Knowledge of General Education information will improve 08/10/2017 1955 - Progressing by Kalman JewelsBallentine, Serjio Deupree, RN Spiritual Needs Ability to function at adequate level 08/10/2017 1955 - Progressing by Kalman JewelsBallentine, Arria Naim, RN

## 2017-08-11 LAB — RENAL FUNCTION PANEL
ALBUMIN: 1.8 g/dL — AB (ref 3.5–5.0)
Anion gap: 13 (ref 5–15)
BUN: 71 mg/dL — ABNORMAL HIGH (ref 6–20)
CO2: 26 mmol/L (ref 22–32)
Calcium: 8.1 mg/dL — ABNORMAL LOW (ref 8.9–10.3)
Chloride: 94 mmol/L — ABNORMAL LOW (ref 101–111)
Creatinine, Ser: 2.85 mg/dL — ABNORMAL HIGH (ref 0.61–1.24)
GFR calc Af Amer: 32 mL/min — ABNORMAL LOW (ref >60)
GFR, EST NON AFRICAN AMERICAN: 27 mL/min — AB (ref >60)
GLUCOSE: 208 mg/dL — AB (ref 65–99)
PHOSPHORUS: 5 mg/dL — AB (ref 2.5–4.6)
POTASSIUM: 3.8 mmol/L (ref 3.5–5.1)
Sodium: 133 mmol/L — ABNORMAL LOW (ref 135–145)

## 2017-08-11 LAB — URINALYSIS, ROUTINE W REFLEX MICROSCOPIC
Bilirubin Urine: NEGATIVE
Glucose, UA: 150 mg/dL — AB
Hgb urine dipstick: NEGATIVE
KETONES UR: NEGATIVE mg/dL
Leukocytes, UA: NEGATIVE
Nitrite: NEGATIVE
PH: 6 (ref 5.0–8.0)
Specific Gravity, Urine: 1.01 (ref 1.005–1.030)
Squamous Epithelial / LPF: NONE SEEN

## 2017-08-11 LAB — PROTEIN / CREATININE RATIO, URINE
CREATININE, URINE: 40 mg/dL
Protein Creatinine Ratio: 13.53 mg/mg{Cre} — ABNORMAL HIGH (ref 0.00–0.15)
Total Protein, Urine: 541 mg/dL

## 2017-08-11 LAB — CBC
HEMATOCRIT: 26.2 % — AB (ref 40.0–52.0)
HEMOGLOBIN: 8.8 g/dL — AB (ref 13.0–18.0)
MCH: 27 pg (ref 26.0–34.0)
MCHC: 33.5 g/dL (ref 32.0–36.0)
MCV: 80.5 fL (ref 80.0–100.0)
Platelets: 429 10*3/uL (ref 150–440)
RBC: 3.25 MIL/uL — ABNORMAL LOW (ref 4.40–5.90)
RDW: 15.3 % — ABNORMAL HIGH (ref 11.5–14.5)
WBC: 12.8 10*3/uL — AB (ref 3.8–10.6)

## 2017-08-11 LAB — GLUCOSE, CAPILLARY
GLUCOSE-CAPILLARY: 191 mg/dL — AB (ref 65–99)
GLUCOSE-CAPILLARY: 292 mg/dL — AB (ref 65–99)
Glucose-Capillary: 164 mg/dL — ABNORMAL HIGH (ref 65–99)
Glucose-Capillary: 214 mg/dL — ABNORMAL HIGH (ref 65–99)
Glucose-Capillary: 226 mg/dL — ABNORMAL HIGH (ref 65–99)
Glucose-Capillary: 240 mg/dL — ABNORMAL HIGH (ref 65–99)
Glucose-Capillary: 350 mg/dL — ABNORMAL HIGH (ref 65–99)

## 2017-08-11 MED ORDER — IPRATROPIUM-ALBUTEROL 0.5-2.5 (3) MG/3ML IN SOLN
3.0000 mL | Freq: Three times a day (TID) | RESPIRATORY_TRACT | Status: DC
  Administered 2017-08-11 – 2017-08-12 (×2): 3 mL via RESPIRATORY_TRACT
  Filled 2017-08-11 (×2): qty 3

## 2017-08-11 NOTE — Progress Notes (Signed)
MEDICATION RELATED CONSULT NOTE  Pharmacy Consult for electrolyte management.  Assessment/Plan: K = 3.8 is WNL. Furosemide drip has been discontinued. No supplementation warranted. Will sign off.   Allergies  Allergen Reactions  . Lisinopril Swelling    Patient Measurements: Height: 5\' 6"  (167.6 cm) Weight: 230 lb 11.2 oz (104.6 kg) IBW/kg (Calculated) : 63.8  Vital Signs: Temp: 98.5 F (36.9 C) (02/04 0435) Temp Source: Oral (02/04 0435) BP: 163/84 (02/04 0435) Pulse Rate: 92 (02/04 0435) Intake/Output from previous day: 02/03 0701 - 02/04 0700 In: 1440 [P.O.:1440] Out: 5825 [Urine:5825] Intake/Output from this shift: No intake/output data recorded.  Labs: Recent Labs    08/09/17 0436 08/09/17 0652 08/10/17 0406 08/11/17 0442  WBC  --  17.1* 13.4* 12.8*  HGB  --  9.0* 9.0* 8.8*  HCT  --  26.8* 27.3* 26.2*  PLT  --  444* 446* 429  CREATININE 3.44*  --  3.25* 2.85*  PHOS  --   --   --  5.0*  ALBUMIN  --   --   --  1.8*   Estimated Creatinine Clearance: 41.8 mL/min (A) (by C-G formula based on SCr of 2.85 mg/dL (H)).  Cindi CarbonMary M Cresencio Reesor, PharmD 08/11/2017,7:15 AM

## 2017-08-11 NOTE — Progress Notes (Signed)
Physical Therapy Treatment Patient Details Name: [name] MRN: [id number] DOB: 12/05/1985 Today's Date: 08/11/2017    History of Present Illness Pt admitted for anasarca with complaints of chest pain. Pt with history of AKI, DM, HTN, R AKA, and NSTEMI, and hyponatremia. Pt with recent admission for similar symptoms. Hospital stay has been complicated by severe respiratory failure and progressive cardio renal syndrom. Pt now in CCU, received new order for continued PT    PT Comments    Pt agreeable to PT; denies pain, but notes continued swelling in R residual limb. Pt requiring min guard for bed mobility sit to/from supine, but increased assist (max of 2) to reposition upward in bed with use of trapeze. Pt unable to don prosthesis fully due to swelling in R residual limb. Stands with Min A for elevated surface for attempted prosthetic fit and again for standing weight on scale. Second person assist for safety. Pt demonstrates fatigue with static stand during hygiene and bed change with inability to maintain full upright stand requiring greater left side lean onto forearm. Nursing staff wishing to bathe pt, so preferred back to bed at this time. Continue PT to progress participation with out of bed activity; also progress to ambulation once prosthesis fits. If unable to attain, may need to change discharge recommendation.   Follow Up Recommendations  Outpatient PT;Other (comment)(if pt able to fit prosthesis and ambulate)     Equipment Recommendations  None recommended by PT    Recommendations for Other Services       Precautions / Restrictions Precautions Precautions: Fall Restrictions Weight Bearing Restrictions: No    Mobility  Bed Mobility Overal bed mobility: Needs Assistance Bed Mobility: Supine to Sit;Sit to Supine     Supine to sit: Min guard Sit to supine: Min guard   General bed mobility comments: 2 person assist to reposition upward in bed and use of  trapeze  Transfers Overall transfer level: Needs assistance Equipment used: Rolling walker (2 wheeled) Transfers: Sit to/from Stand Sit to Stand: Min assist;+2 safety/equipment;From elevated surface(to stand on scale)         General transfer comment: partial donning of prosthesis; attempt to stand for fit with weight bearing, but does not. Second stand same fashion on scale.   Ambulation/Gait             General Gait Details: unable   Stairs            Wheelchair Mobility    Modified Rankin (Stroke Patients Only)       Balance Overall balance assessment: History of Falls;Needs assistance Sitting-balance support: No upper extremity supported;Feet supported Sitting balance-Leahy Scale: Normal     Standing balance support: Bilateral upper extremity supported Standing balance-Leahy Scale: Fair Standing balance comment: demonstrates upright posture briefly, but fatigues with static stand time with lean on left forearm several times                            Cognition Arousal/Alertness: Awake/alert Behavior During Therapy: WFL for tasks assessed/performed Overall Cognitive Status: Within Functional Limits for tasks assessed                                        Exercises Other Exercises Other Exercises: Static stand during bed change and for weight on scale    General Comments  Pertinent Vitals/Pain Pain Assessment: No/denies pain    Home Living                      Prior Function            PT Goals (current goals can now be found in the care plan section) Progress towards PT goals: Progressing toward goals    Frequency    Min 2X/week      PT Plan Current plan remains appropriate    Co-evaluation              AM-PAC PT "6 Clicks" Daily Activity  Outcome Measure  Difficulty turning over in bed (including adjusting bedclothes, sheets and blankets)?: A Little Difficulty moving from  lying on back to sitting on the side of the bed? : Unable Difficulty sitting down on and standing up from a chair with arms (e.g., wheelchair, bedside commode, etc,.)?: Unable Help needed moving to and from a bed to chair (including a wheelchair)?: A Lot Help needed walking in hospital room?: Total Help needed climbing 3-5 steps with a railing? : Total 6 Click Score: 9    End of Session   Activity Tolerance: Patient limited by fatigue;Other (comment)(non fitting prosthetic due to swelling R residual limb) Patient left: in bed;with call bell/phone within reach;with bed alarm set;with nursing/sitter in room   PT Visit Diagnosis: Unsteadiness on feet (R26.81);Muscle weakness (generalized) (M62.81);Difficulty in walking, not elsewhere classified (R26.2)     Time: 3086-5784 PT Time Calculation (min) (ACUTE ONLY): 24 min  Charges:  $Therapeutic Activity: 23-37 mins                    G CodesScot Dock, PTA 08/11/2017, 2:04 PM

## 2017-08-11 NOTE — Plan of Care (Signed)
  RD consulted for nutrition education regarding diabetes.   Lab Results  Component Value Date   HGBA1C 14.1 (H) 06/10/2017    RD provided "Carbohydrate Counting for People with Diabetes" handout from the Academy of Nutrition and Dietetics. Discussed different food groups and their effects on blood sugar, emphasizing carbohydrate-containing foods. Provided list of carbohydrates and recommended serving sizes of common foods.  Discussed importance of controlled and consistent carbohydrate intake throughout the day. Provided examples of ways to balance meals/snacks and encouraged intake of high-fiber, whole grain complex carbohydrates. Teach back method used.  Expect fair compliance. Patient would benefit from outpatient counseling with an RD once he has insurance.  Body mass index is 37.96 kg/m. Pt meets criteria for Obesity Class II based on current BMI.  Current diet order is Heart Healthy/Carbohydrate Modified, patient is consuming approximately 100% of meals at this time. Labs and medications reviewed. No further nutrition interventions warranted at this time. RD contact information provided. If additional nutrition issues arise, please re-consult RD.  Henry RimaLeanne Hilari Wethington, MS, RD, LDN Office: [phone] Pager: [phone] After Hours/Weekend Pager: [phone]

## 2017-08-11 NOTE — Progress Notes (Signed)
Notified Dr. Tobi BastosPyreddy via text page of H&H of 8.8. Will continue to monitor.

## 2017-08-11 NOTE — Progress Notes (Signed)
Ch followed up with Pt. He was being washed but Ch let him know he will follow up    08/11/17 1500  Clinical Encounter Type  Visited With Patient  Visit Type Follow-up  Referral From Nurse  Spiritual Encounters  Spiritual Needs Emotional

## 2017-08-11 NOTE — Progress Notes (Signed)
Nutrition Brief Note  Patient identified to be seen for length of stay and also received consult for diet education (note to follow)  Wt Readings from Last 15 Encounters:  08/11/17 235 lb 3.2 oz (106.7 kg)  07/22/17 223 lb (101.2 kg)  07/17/17 223 lb (101.2 kg)  06/09/17 223 lb 6.4 oz (101.3 kg)  04/17/17 201 lb 3 oz (91.3 kg)  03/24/17 181 lb (82.1 kg)  09/09/16 162 lb 14.7 oz (73.9 kg)  06/01/16 189 lb (85.7 kg)   Met with patient at bedside. He reports his appetite is good. It was slightly decreased when he was admitted, but he was still able to eat. He has had diabetes since 2010. He has received nutrition education before, but cannot recall exactly what they went over. He typically eats 2 meals per day but reports his intake may vary. He may go out to eat for a meal or have a sandwich or pack of crackers. He is unable to provide any further details on intake. Reports he takes 10 units of insulin at meals (Novolog per home med list) and 30 units QHS (Levemir per home med list).  No fat or muscle wasting present on NFPE. Moderate pitting edema present. Patient s/p right AKA.  Patient does not meet criteria for malnutrition.  Body mass index is 37.96 kg/m. Patient meets criteria for Obesity Class II based on current BMI.   Current diet order is Heart Healthy/Carbohydrate Modified, patient is consuming approximately 100% of meals at this time. Labs and medications reviewed.   No nutrition interventions warranted at this time. If nutrition issues arise, please consult RD.   Willey Blade, MS, [place], LDN Office: [phone] Pager: [phone] After Hours/Weekend Pager: [phone]

## 2017-08-11 NOTE — Progress Notes (Signed)
Inpatient Diabetes Program Recommendations  AACE/ADA: New Consensus Statement on Inpatient Glycemic Control (2015)  Target Ranges:  Prepandial:   less than 140 mg/dL      Peak postprandial:   less than 180 mg/dL (1-2 hours)      Critically ill patients:  140 - 180 mg/dL   Lab Results  Component Value Date   GLUCAP 240 (H) 08/11/2017   HGBA1C 14.1 (H) 06/10/2017    Review of Glycemic ControlResults for [name], Henry (MRN [id number]) as of 08/11/2017 12:40  Ref. Range 08/11/2017 00:21 08/11/2017 04:06 08/11/2017 04:38 08/11/2017 07:40 08/11/2017 12:06  Glucose-Capillary Latest Ref Range: 65 - 99 mg/dL 657214 (H) 846226 (H) 962191 (H) 164 (H) 240 (H)   History: Type 2 DM Home DM Meds: Levemir 30 units QHS Novolog 10 units TID Current Orders:Novolog moderate Correction Scale/ SSI (0-15 units)q4h  Levemir 25 units QHS  *Solu-medrol 40mg  q12h  Inpatient Diabetes Program Recommendations:  Please consider increasing Levemir to home dose of 30 units q HS.  Also consider adding Novolog meal coverage 5 units tid with meals (this is 1/2 of home dose of meal coverage).   Thanks,  Beryl MeagerJenny Kameelah Minish, RN, BC-ADM Inpatient Diabetes Coordinator Pager [phone] (8a-5p)

## 2017-08-11 NOTE — Progress Notes (Signed)
[employer], [place] 08/11/17  Subjective:   Patient states that he is breathing better and his appetite is returning today. He states that he is going to make more of an effort to eat correctly and take care of himself in the future.  Objective:  Vital signs in last 24 hours:  Temp:  [98.3 F (36.8 C)-98.9 F (37.2 C)] 98.9 F (37.2 C) (02/04 0804) Pulse Rate:  [92-100] 97 (02/04 0804) Resp:  [16] 16 (02/04 0804) BP: (154-170)/(72-86) 170/81 (02/04 0804) SpO2:  [88 %-98 %] 96 % (02/04 0804) FiO2 (%):  [45 %] 45 % (02/04 0140) Weight:  [104.6 kg (230 lb 11.2 oz)] 104.6 kg (230 lb 11.2 oz) (02/04 0500)  Weight change: -1.406 kg (-1.6 oz) Filed Weights   08/09/17 0309 08/10/17 0424 08/11/17 0500  Weight: 108.6 kg (239 lb 6.7 oz) 106.1 kg (233 lb 12.8 oz) 104.6 kg (230 lb 11.2 oz)    Intake/Output:    Intake/Output Summary (Last 24 hours) at 08/11/2017 1057 Last data filed at 08/11/2017 1055 Gross per 24 hour  Intake 1440 ml  Output 5225 ml  Net -3785 ml     Physical Exam: General: Normocephalic and atraumatic.  HEENT Moist oral mucous membranes.  Neck No distended neck veins.  Pulm/lungs Slight wheezing heard bilaterally, clears with cough.  CVS/Heart Tachycardic, regular rhythm with no m/r/g.  Abdomen:  Soft and nontender.  Extremities: Righ AKA. Dependent edema on LLE.  Neurologic: Alert and oriented. Normal speech.  Skin: Left leg with darker skin d/t venous stasis changes.          Basic Metabolic Panel:  Recent Labs  Lab 08/06/17 0726 08/07/17 0230 08/07/17 1518 08/08/17 0210 08/09/17 0436 08/10/17 0406 08/11/17 0442  NA 132* 133* 135 134* 136 135 133*  K 3.7 3.9 4.0 3.9 3.5 3.8 3.8  CL 98* 99* 102 100* 101 99* 94*  CO2 22 24 23 23 24 25 26   GLUCOSE 215* 227* 75 219* 186* 179* 208*  BUN 53* 56* 57* 60* 66* 72* 71*  CREATININE 3.56* 3.54* 3.43* 3.43* 3.44* 3.25* 2.85*  CALCIUM 7.9* 7.9* 8.3* 7.9* 8.1* 8.3* 8.1*  MG  --   2.4 2.5*  --   --   --   --   PHOS 5.4*  --  5.3*  --   --   --  5.0*     CBC: Recent Labs  Lab 08/07/17 0230 08/07/17 1518 08/09/17 0652 08/10/17 0406 08/11/17 0442  WBC 12.3* 12.3* 17.1* 13.4* 12.8*  HGB 8.5* 8.6* 9.0* 9.0* 8.8*  HCT 25.5* 26.5* 26.8* 27.3* 26.2*  MCV 80.3 80.7 80.4 80.6 80.5  PLT 374 408 444* 446* 429      Lab Results  Component Value Date   HEPBSAG Negative 04/15/2017   HEPBSAB Non Reactive 04/15/2017      Microbiology:  Recent Results (from the past 240 hour(s))  CULTURE, BLOOD (ROUTINE X 2) w Reflex to ID Panel     Status: None   Collection Time: 08/01/17  5:54 PM  Result Value Ref Range Status   Specimen Description BLOOD LEFT ANTECUBITAL  Final   Special Requests   Final    BOTTLES DRAWN AEROBIC AND ANAEROBIC Blood Culture results may not be optimal due to an inadequate volume of blood received in culture bottles   Culture   Final    NO GROWTH 5 DAYS Performed at [facility], [address], [place], [place] [address]  Report Status 08/06/2017 FINAL  Final  CULTURE, BLOOD (ROUTINE X 2) w Reflex to ID Panel     Status: None   Collection Time: 08/01/17  6:03 PM  Result Value Ref Range Status   Specimen Description BLOOD BLOOD RIGHT HAND  Final   Special Requests   Final    BOTTLES DRAWN AEROBIC AND ANAEROBIC Blood Culture adequate volume   Culture   Final    NO GROWTH 5 DAYS Performed at [facility], [address] [place], [name] [address]    Report Status 08/06/2017 FINAL  Final  MRSA PCR Screening     Status: None   Collection Time: 08/04/17  3:48 PM  Result Value Ref Range Status   MRSA by PCR NEGATIVE NEGATIVE Final    Comment:        The GeneXpert MRSA Assay (FDA approved for NASAL specimens only), is one component of a comprehensive MRSA colonization surveillance program. It is not intended to diagnose MRSA infection nor to guide or monitor treatment for MRSA infections. Performed at [facility], [address] [place], [name] [address]     Coagulation Studies: No results for input(s): LABPROT, INR in the last 72 hours.  Urinalysis: No results for input(s): COLORURINE, LABSPEC, PHURINE, GLUCOSEU, HGBUR, BILIRUBINUR, KETONESUR, PROTEINUR, UROBILINOGEN, NITRITE, LEUKOCYTESUR in the last 72 hours.  Invalid input(s): APPERANCEUR    Imaging: No results found.   Medications:   . sodium chloride Stopped (08/05/17 1730)   . aspirin EC  81 mg Oral Daily  . atorvastatin  40 mg Oral q1800  . budesonide (PULMICORT) nebulizer solution  0.25 mg Nebulization BID  . carvedilol  12.5 mg Oral BID WC  . famotidine  20 mg Oral BID  . FLUoxetine  40 mg Oral Daily  . furosemide  80 mg Intravenous Q12H  . heparin injection (subcutaneous)  5,000 Units Subcutaneous Q8H  . hydrALAZINE  100 mg Oral Q8H  . insulin aspart  0-15 Units Subcutaneous Q4H  . insulin detemir  25 Units Subcutaneous QHS  . ipratropium-albuterol  3 mL Nebulization Q6H  . magnesium oxide  400 mg Oral BID  . mouth rinse  15 mL Mouth Rinse BID  . methylPREDNISolone (SOLU-MEDROL) injection  40 mg Intravenous Q12H  . sodium chloride flush  10-40 mL Intracatheter Q12H   acetaminophen, alum & mag hydroxide-simeth, docusate sodium, ipratropium-albuterol, morphine injection, sodium chloride, sodium chloride flush  Assessment/ Plan:  32 y.o. black male with diabetes mellitus type 1, HTN, CAD, PVD s/p right AKA, HLD [facility] 07[facility]17 for evaluation of severe anascara.  1. Acute Renal Failure with hyponatremia, likely multifactorial including IV contrast exposure (08/01/17), DM1, cardiorenal syndrome. Patient does not appear hypovolemic and previous renal US (04/15/17) without significant abnormalities. - Na 135 (08/10/17) --> 133 (2/4) - Cr 3.25; GFR 27 (08/10/17) --> Cr 2.85; GFR 32 (2/4). Baseline Cr 1.25, GFR >60 (06/11/17). - Continue furosemide 80 IV q12h.  2. HTN with anascara / edema. H/o angioedema  with lisinopril. Echo (1/31) with grade 2 diastolic dysfunction. EF 55-60% - Continue furosemide as above, hydralazine 100mg  q8h and carvedilol 12.5mg  BID.  3. DM1 with CKD, unspecified. - Hemoglobin A1C 14.1% (06/10/17)  - Diabetic education provided.  - Consult to nutrition team.  4. Proteinuria 9.3g in October 2018.  - Repeat protein to creatinine ratio, UA.   LOS: 13 Alicja Everitt Thedore MinsSingh 2/4/201910:57 AM  [facility] [place], [name] [phone]

## 2017-08-11 NOTE — Progress Notes (Signed)
Ch was rounding unit and noticed the longevity of the Pt stay and asked nurse if Pt would benefit from a spiritual visit. Ch offered a listen ear and notice  Anxiety from the pt about his health issue. Ch prayed with the Pt regarding strength and health. Dr enter and Ch will follow up. Checked with nurse for Pt plan and thinks Pt will be in for a few more days.    08/11/17 1000  Clinical Encounter Type  Visited With Patient  Visit Type Spiritual support  Referral From Nurse  Spiritual Encounters  Spiritual Needs Prayer;Emotional

## 2017-08-11 NOTE — Progress Notes (Signed)
Patient ID: [name], male   DOB: 03/17/1986, 32 y.o.   MRN: [id number] Gordon  [facility] PROGRESS NOTE  [name] [name]:[id number] DOB: 10/22/1985 DOA: 07/29/2017 PCP: [name]  HPI/Subjective: Patient feeling better.  Urinating well.  States shortness of breath is better.  Objective: Vitals:   08/11/17 0804 08/11/17 1345  BP: (!) 170/81   Pulse: 97 (!) 102  Resp: 16   Temp: 98.9 F (37.2 C)   SpO2: 96%     Intake/Output Summary (Last 24 hours) at 08/11/2017 1450 Last data filed at 08/11/2017 1055 Gross per 24 hour  Intake 960 ml  Output 4375 ml  Net -3415 ml   Filed Weights   08/10/17 0424 08/11/17 0500 08/11/17 1347  Weight: 106.1 kg (233 lb 12.8 oz) 104.6 kg (230 lb 11.2 oz) 106.7 kg (235 lb 3.2 oz)    ROS: Review of Systems  Constitutional: Negative for chills and fever.  Eyes: Negative for blurred vision.  Respiratory: Positive for cough and shortness of breath.   Cardiovascular: Negative for chest pain.  Gastrointestinal: Negative for abdominal pain, constipation, diarrhea, nausea and vomiting.  Genitourinary: Negative for dysuria.  Musculoskeletal: Negative for joint pain.  Neurological: Negative for dizziness and headaches.   Exam: Physical Exam  Constitutional: He is oriented to person, place, and time.  HENT:  Nose: No mucosal edema.  Mouth/Throat: No oropharyngeal exudate or posterior oropharyngeal edema.  Eyes: Conjunctivae, EOM and lids are normal. Pupils are equal, round, and reactive to light.  Neck: No JVD present. Carotid bruit is not present. No edema present. No thyroid mass and no thyromegaly present.  Cardiovascular: S1 normal and S2 normal. Exam reveals no gallop.  No murmur heard. Pulses:      Dorsalis pedis pulses are 2+ on the right side, and 2+ on the left side.  Respiratory: No respiratory distress. He has decreased breath sounds in the right lower field and the left lower field. He has no wheezes. He has no rhonchi.  He has rales in the right lower field and the left lower field.  GI: Soft. Bowel sounds are normal. He exhibits distension. There is no tenderness.  Musculoskeletal:       Left ankle: He exhibits swelling.  Lymphadenopathy:    He has no cervical adenopathy.  Neurological: He is alert and oriented to person, place, and time. No cranial nerve deficit.  Skin: Skin is warm. No rash noted. Nails show no clubbing.  Psychiatric: He has a normal mood and affect.      Data Reviewed: Basic Metabolic Panel: Recent Labs  Lab 08/06/17 0726 08/07/17 0230 08/07/17 1518 08/08/17 0210 08/09/17 0436 08/10/17 0406 08/11/17 0442  NA 132* 133* 135 134* 136 135 133*  K 3.7 3.9 4.0 3.9 3.5 3.8 3.8  CL 98* 99* 102 100* 101 99* 94*  CO2 22 24 23 23 24 25 26   GLUCOSE 215* 227* 75 219* 186* 179* 208*  BUN 53* 56* 57* 60* 66* 72* 71*  CREATININE 3.56* 3.54* 3.43* 3.43* 3.44* 3.25* 2.85*  CALCIUM 7.9* 7.9* 8.3* 7.9* 8.1* 8.3* 8.1*  MG  --  2.4 2.5*  --   --   --   --   PHOS 5.4*  --  5.3*  --   --   --  5.0*   Liver Function Tests: Recent Labs  Lab 08/06/17 0726 08/07/17 1518 08/11/17 0442  AST  --  27  --   ALT  --  21  --  ALKPHOS  --  84  --   BILITOT  --  0.6  --   PROT  --  5.7*  --   ALBUMIN 2.4* 2.3* 1.8*   CBC: Recent Labs  Lab 08/07/17 0230 08/07/17 1518 08/09/17 0652 08/10/17 0406 08/11/17 0442  WBC 12.3* 12.3* 17.1* 13.4* 12.8*  HGB 8.5* 8.6* 9.0* 9.0* 8.8*  HCT 25.5* 26.5* 26.8* 27.3* 26.2*  MCV 80.3 80.7 80.4 80.6 80.5  PLT 374 408 444* 446* 429   Cardiac Enzymes: Recent Labs  Lab 08/05/17 0559 08/05/17 1200 08/07/17 1518 08/07/17 2000 08/08/17 0209  TROPONINI 0.09* 0.08* 0.05* 0.05* 0.05*   BNP (last 3 results) Recent Labs    07/22/17 1640 07/29/17 0955 08/07/17 0230  BNP 149.0* 93.0 398.0*     CBG: Recent Labs  Lab 08/11/17 0021 08/11/17 0406 08/11/17 0438 08/11/17 0740 08/11/17 1206  GLUCAP 214* 226* 191* 164* 240*    Recent Results  (from the past 240 hour(s))  CULTURE, BLOOD (ROUTINE X 2) w Reflex to ID Panel     Status: None   Collection Time: 08/01/17  5:54 PM  Result Value Ref Range Status   Specimen Description BLOOD LEFT ANTECUBITAL  Final   Special Requests   Final    BOTTLES DRAWN AEROBIC AND ANAEROBIC Blood Culture results may not be optimal due to an inadequate volume of blood received in culture bottles   Culture   Final    NO GROWTH 5 DAYS Performed at [facility], [address] [place], [place] [address]    Report Status 08/06/2017 FINAL  Final  CULTURE, BLOOD (ROUTINE X 2) w Reflex to ID Panel     Status: None   Collection Time: 08/01/17  6:03 PM  Result Value Ref Range Status   Specimen Description BLOOD BLOOD RIGHT HAND  Final   Special Requests   Final    BOTTLES DRAWN AEROBIC AND ANAEROBIC Blood Culture adequate volume   Culture   Final    NO GROWTH 5 DAYS Performed at [facility], [address] [place], [place] [address]    Report Status 08/06/2017 FINAL  Final  MRSA PCR Screening     Status: None   Collection Time: 08/04/17  3:48 PM  Result Value Ref Range Status   MRSA by PCR NEGATIVE NEGATIVE Final    Comment:        The GeneXpert MRSA Assay (FDA approved for NASAL specimens only), is one component of a comprehensive MRSA colonization surveillance program. It is not intended to diagnose MRSA infection nor to guide or monitor treatment for MRSA infections. Performed at [facility], [address] [place], [place] [address]      Studies: No results found.  Scheduled Meds: . aspirin EC  81 mg Oral Daily  . atorvastatin  40 mg Oral q1800  . budesonide (PULMICORT) nebulizer solution  0.25 mg Nebulization BID  . carvedilol  12.5 mg Oral BID WC  . famotidine  20 mg Oral BID  . FLUoxetine  40 mg Oral Daily  . furosemide  80 mg Intravenous Q12H  . heparin injection (subcutaneous)  5,000 Units Subcutaneous Q8H  . hydrALAZINE  100 mg Oral Q8H   . insulin aspart  0-15 Units Subcutaneous Q4H  . insulin detemir  25 Units Subcutaneous QHS  . ipratropium-albuterol  3 mL Nebulization Q6H  . magnesium oxide  400 mg Oral BID  . mouth rinse  15 mL Mouth Rinse BID  . methylPREDNISolone (SOLU-MEDROL)  injection  40 mg Intravenous Q12H  . sodium chloride flush  10-40 mL Intracatheter Q12H   Continuous Infusions: . sodium chloride Stopped (08/05/17 1730)    Assessment/Plan:  1. Acute respiratory failure secondary to fluid overload.  Patient on Lasix drip which is changed to Lasix 80 mg IV every 12 hours and diuresing better.  Patient had 5800 mL output yesterday.  Monitor daily weights patient currently on 4 L of oxygen.   2. Acute kidney injury on chronic kidney disease stage II.  Creatinine 2.85.  Nephrology is following 3. Anasarca and nephrotic syndrome ; Lasix drip is changed to Lasix 80 mg IV every 12 hours 4. Type 2 diabetes mellitus.  On Levemir 25 units and NovoLog with meals.  Hemoglobin A1c very elevated blood sugars better controlled here in the hospital. 5. Hypomagnesemia and hypokalemia replaced 6. Essential hypertension.  Blood pressure elevated.  Continue Lasix IV titrate medications as needed.  On Norvasc Coreg and hydralazine. 7. Hyperlipidemia unspecified on atorvastatin 8. Depression on fluoxetine 9. Healthcare associated pneumonia on Zosyn 10. Anemia of chronic disease.  Continue to follow hemoglobin.  Code Status:     Code Status Orders  (From admission, onward)        Start     Ordered   07/29/17 1356  Full code  Continuous     07/29/17 1355    Code Status History    Date Active Date Inactive Code Status Order ID Comments User Context   06/09/2017 15:22 06/12/2017 17:22 Full Code [id number]  Ramonita Lab, [name] Inpatient   04/14/2017 14:05 04/18/2017 20:05 Full Code [id number]  Shaune Pollack, [name] Inpatient   09/09/2016 19:55 09/11/2016 18:59 Full Code [id number]  Altamese Dilling, [name] Inpatient   06/01/2016 19:26  06/02/2016 18:35 Full Code [id number]  Ramonita Lab, [name] Inpatient     Family Communication: As per critical care team Disposition Plan: To be determined   Consultants:  Critical care specialist  Nephrology  Psychiatry  Antibiotics:  Zosyn  Time spent: 25 minutes, care discussed with critical care specialist.  Ramonita Lab  [facility]

## 2017-08-11 NOTE — Progress Notes (Signed)
Verbal order received by MD Thedore MinsSingh for daily weighs on standing scale with prosthetic leg on. Order placed.

## 2017-08-11 NOTE — Progress Notes (Signed)
Pts BP 177/95, scheduled BP given. MD Wieting notified. No new orders

## 2017-08-12 LAB — BASIC METABOLIC PANEL
Anion gap: 9 (ref 5–15)
BUN: 73 mg/dL — AB (ref 6–20)
CALCIUM: 8.3 mg/dL — AB (ref 8.9–10.3)
CHLORIDE: 94 mmol/L — AB (ref 101–111)
CO2: 26 mmol/L (ref 22–32)
CREATININE: 2.84 mg/dL — AB (ref 0.61–1.24)
GFR calc non Af Amer: 28 mL/min — ABNORMAL LOW (ref >60)
GFR, EST AFRICAN AMERICAN: 32 mL/min — AB (ref >60)
Glucose, Bld: 191 mg/dL — ABNORMAL HIGH (ref 65–99)
Potassium: 4.4 mmol/L (ref 3.5–5.1)
Sodium: 129 mmol/L — ABNORMAL LOW (ref 135–145)

## 2017-08-12 LAB — GLUCOSE, CAPILLARY
GLUCOSE-CAPILLARY: 189 mg/dL — AB (ref 65–99)
GLUCOSE-CAPILLARY: 237 mg/dL — AB (ref 65–99)
GLUCOSE-CAPILLARY: 303 mg/dL — AB (ref 65–99)
GLUCOSE-CAPILLARY: 364 mg/dL — AB (ref 65–99)
Glucose-Capillary: 170 mg/dL — ABNORMAL HIGH (ref 65–99)
Glucose-Capillary: 252 mg/dL — ABNORMAL HIGH (ref 65–99)
Glucose-Capillary: 279 mg/dL — ABNORMAL HIGH (ref 65–99)

## 2017-08-12 LAB — POTASSIUM: POTASSIUM: 3.8 mmol/L (ref 3.5–5.1)

## 2017-08-12 MED ORDER — IPRATROPIUM-ALBUTEROL 0.5-2.5 (3) MG/3ML IN SOLN
3.0000 mL | Freq: Two times a day (BID) | RESPIRATORY_TRACT | Status: DC
  Administered 2017-08-12 – 2017-08-14 (×3): 3 mL via RESPIRATORY_TRACT
  Filled 2017-08-12 (×3): qty 3

## 2017-08-12 MED ORDER — TORSEMIDE 20 MG PO TABS
40.0000 mg | ORAL_TABLET | Freq: Two times a day (BID) | ORAL | Status: DC
  Administered 2017-08-12 – 2017-08-13 (×3): 40 mg via ORAL
  Filled 2017-08-12 (×3): qty 2

## 2017-08-12 NOTE — Progress Notes (Signed)
Inpatient Diabetes Program Recommendations  AACE/ADA: New Consensus Statement on Inpatient Glycemic Control (2015)  Target Ranges:  Prepandial:   less than 140 mg/dL      Peak postprandial:   less than 180 mg/dL (1-2 hours)      Critically ill patients:  140 - 180 mg/dL   Lab Results  Component Value Date   GLUCAP 170 (H) 08/12/2017   HGBA1C 14.1 (H) 06/10/2017    Review of Glycemic ControlResults for [name], [name] (MRN [id number]) as of 08/12/2017 09:53  Ref. Range 08/11/2017 16:46 08/11/2017 20:57 08/12/2017 00:18 08/12/2017 04:36 08/12/2017 07:52  Glucose-Capillary Latest Ref Range: 65 - 99 mg/dL 782292 (H) 956350 (H) 213303 (H) 189 (H) 170 (H)   History:Type 2 DM Home DM Meds: Levemir 30 units QHS Novolog 10 units TID Current Orders:NovologmoderateCorrection Scale/ SSI (0-15 units)q4h  Levemir 25units QHS *Solu-medrol 40mg  q12h  Inpatient Diabetes Program Recommendations:  Please consider increasing Levemir to home dose of 30 units q HS.  Also consider adding Novolog meal coverage 5 units tid with meals (this is 1/2 of home dose of meal coverage). Text page sent.  Thanks, Beryl MeagerJenny Meshelle Holness, RN, BC-ADM Inpatient Diabetes Coordinator Pager [phone] (8a-5p)

## 2017-08-12 NOTE — Progress Notes (Signed)
Physical Therapy Treatment Patient Details Name: Henry DyerDarrick Pollet MRN: [id number] DOB: 05/13/1986 Today's Date: 08/12/2017    History of Present Illness Pt admitted for anasarca with complaints of chest pain. Pt with history of AKI, DM, HTN, R AKA, and NSTEMI, and hyponatremia. Pt with recent admission for similar symptoms. Hospital stay has been complicated by severe respiratory failure and progressive cardio renal syndrom. Pt now in CCU, received new order for continued PT    PT Comments    MD paged; spoke with nurse regarding need for PT treatment today. Nurse wished pt seen. Pt still unable to fit prosthetic on; pt agreeable to exercise, as pt has already been up in chair today and does not prefer to sit in chair at this time. Pt reports pain in R hip flexor area with cramping. Education on positional changes, flexion versus extension. Also educated in sidelying hip flexor stretching. Session limited due to pt soiling bed and need for hygiene/bed change. Continue PT to progress to ambulation with prosthesis once able to don.    Follow Up Recommendations  Outpatient PT;Other (comment)(based on ability to ambulate with prosthesis)     Equipment Recommendations  None recommended by PT    Recommendations for Other Services       Precautions / Restrictions Precautions Precautions: Fall Restrictions Weight Bearing Restrictions: No    Mobility  Bed Mobility Overal bed mobility: Modified Independent Bed Mobility: Rolling Rolling: Modified independent (Device/Increase time)         General bed mobility comments: use of rail and increased time  Transfers                    Ambulation/Gait                 Stairs            Wheelchair Mobility    Modified Rankin (Stroke Patients Only)       Balance                                            Cognition Arousal/Alertness: Awake/alert Behavior During Therapy: WFL for tasks  assessed/performed Overall Cognitive Status: Within Functional Limits for tasks assessed                                        Exercises Other Exercises Other Exercises: L sidelying R hip flexor stretching    General Comments        Pertinent Vitals/Pain Pain Assessment: Faces Faces Pain Scale: Hurts even more Pain Location: R hip flexors Pain Descriptors / Indicators: Cramping Pain Intervention(s): Other (comment)(educated stretching in L sidelying and positional changes)    Home Living                      Prior Function            PT Goals (current goals can now be found in the care plan section) Progress towards PT goals: Not progressing toward goals - comment    Frequency    Min 2X/week      PT Plan Current plan remains appropriate    Co-evaluation              AM-PAC PT "6 Clicks" Daily Activity  Outcome Measure  Difficulty  turning over in bed (including adjusting bedclothes, sheets and blankets)?: A Little Difficulty moving from lying on back to sitting on the side of the bed? : Unable Difficulty sitting down on and standing up from a chair with arms (e.g., wheelchair, bedside commode, etc,.)?: Unable Help needed moving to and from a bed to chair (including a wheelchair)?: A Lot Help needed walking in hospital room?: Total Help needed climbing 3-5 steps with a railing? : Total 6 Click Score: 9    End of Session   Activity Tolerance: Patient tolerated treatment well;Other (comment)(Treatment limited due to soiling of bed) Patient left: in bed Nurse Communication: Other (comment)(nursing asst called for personal hygiene/bed change) PT Visit Diagnosis: Unsteadiness on feet (R26.81);Muscle weakness (generalized) (M62.81);Difficulty in walking, not elsewhere classified (R26.2)     Time: 6962-9528 PT Time Calculation (min) (ACUTE ONLY): 9 min  Charges:  $Therapeutic Exercise: 8-22 mins                    G Codes:         Scot Dock, PTA 08/12/2017, 4:12 PM

## 2017-08-12 NOTE — Progress Notes (Signed)
[facility] [place], [name] 08/12/17  Subjective:   Nutrition team educated patient. Patient reports that he plans to eat at home more often and agrees to limit daily fluid intake. He reports that his right leg prosthesis no longer fits him due to edema but otherwise has no complaints.   Lost 2-3 lbs UOP 2280 cc   Objective:  Vital signs in last 24 hours:  Temp:  [98.1 F (36.7 C)-98.9 F (37.2 C)] 98.1 F (36.7 C) (02/05 0500) Pulse Rate:  [82-102] 82 (02/05 0908) Resp:  [16-18] 16 (02/04 2002) BP: (159-162)/(82-87) 162/82 (02/05 0908) SpO2:  [96 %-100 %] 100 % (02/05 0815) FiO2 (%):  [45 %] 45 % (02/05 0815) Weight:  [104.8 kg (231 lb 1.6 oz)-106.7 kg (235 lb 3.2 oz)] 104.8 kg (231 lb 1.6 oz) (02/05 40980633)  Weight change: 2.041 kg (4 lb 8 oz) Filed Weights   08/11/17 0500 08/11/17 1347 08/12/17 0633  Weight: 104.6 kg (230 lb 11.2 oz) 106.7 kg (235 lb 3.2 oz) 104.8 kg (231 lb 1.6 oz)    Intake/Output:    Intake/Output Summary (Last 24 hours) at 08/12/2017 1234 Last data filed at 08/12/2017 1007 Gross per 24 hour  Intake 720 ml  Output 2680 ml  Net -1960 ml     Physical Exam: General: Laying in bed watching TV.   HEENT Normocephalic and atraumatic. Moist oral mucous membranes.  Neck No distended neck veins.  Pulm/lungs B/l reduced breath sounds at bases.  CVS/Heart RRR with no m/r/g.  Abdomen:  Soft and nontender.  Extremities: Righ AKA. Pitting edema b/l.   Neurologic: Alert and oriented. Normal speech.  Skin: Left leg with darker skin d/t venous stasis changes.          Basic Metabolic Panel:  Recent Labs  Lab 08/06/17 0726 08/07/17 0230 08/07/17 1518 08/08/17 0210 08/09/17 0436 08/10/17 0406 08/11/17 0442 08/12/17 0454 08/12/17 0907  NA 132* 133* 135 134* 136 135 133*  --  129*  K 3.7 3.9 4.0 3.9 3.5 3.8 3.8 3.8 4.4  CL 98* 99* 102 100* 101 99* 94*  --  94*  CO2 22 24 23 23 24 25 26   --  26  GLUCOSE 215* 227* 75 219* 186*  179* 208*  --  191*  BUN 53* 56* 57* 60* 66* 72* 71*  --  73*  CREATININE 3.56* 3.54* 3.43* 3.43* 3.44* 3.25* 2.85*  --  2.84*  CALCIUM 7.9* 7.9* 8.3* 7.9* 8.1* 8.3* 8.1*  --  8.3*  MG  --  2.4 2.5*  --   --   --   --   --   --   PHOS 5.4*  --  5.3*  --   --   --  5.0*  --   --      CBC: Recent Labs  Lab 08/07/17 0230 08/07/17 1518 08/09/17 0652 08/10/17 0406 08/11/17 0442  WBC 12.3* 12.3* 17.1* 13.4* 12.8*  HGB 8.5* 8.6* 9.0* 9.0* 8.8*  HCT 25.5* 26.5* 26.8* 27.3* 26.2*  MCV 80.3 80.7 80.4 80.6 80.5  PLT 374 408 444* 446* 429      Lab Results  Component Value Date   HEPBSAG Negative 04/15/2017   HEPBSAB Non Reactive 04/15/2017      Microbiology:  Recent Results (from the past 240 hour(s))  MRSA PCR Screening     Status: None   Collection Time: 08/04/17  3:48 PM  Result Value Ref Range Status   MRSA by PCR NEGATIVE NEGATIVE  Final    Comment:        The GeneXpert MRSA Assay (FDA approved for NASAL specimens only), is one component of a comprehensive MRSA colonization surveillance program. It is not intended to diagnose MRSA infection nor to guide or monitor treatment for MRSA infections. Performed at [facility], [address] [place], [place] [address]     Coagulation Studies: No results for input(s): LABPROT, INR in the last 72 hours.  Urinalysis: Recent Labs    08/11/17 1519  COLORURINE STRAW*  LABSPEC 1.010  PHURINE 6.0  GLUCOSEU 150*  HGBUR NEGATIVE  BILIRUBINUR NEGATIVE  KETONESUR NEGATIVE  PROTEINUR >=300*  NITRITE NEGATIVE  LEUKOCYTESUR NEGATIVE      Imaging: No results found.   Medications:   . sodium chloride Stopped (08/05/17 1730)   . aspirin EC  81 mg Oral Daily  . atorvastatin  40 mg Oral q1800  . budesonide (PULMICORT) nebulizer solution  0.25 mg Nebulization BID  . carvedilol  12.5 mg Oral BID WC  . famotidine  20 mg Oral BID  . FLUoxetine  40 mg Oral Daily  . heparin injection (subcutaneous)  5,000  Units Subcutaneous Q8H  . hydrALAZINE  100 mg Oral Q8H  . insulin aspart  0-15 Units Subcutaneous Q4H  . insulin detemir  25 Units Subcutaneous QHS  . ipratropium-albuterol  3 mL Nebulization BID  . magnesium oxide  400 mg Oral BID  . mouth rinse  15 mL Mouth Rinse BID  . methylPREDNISolone (SOLU-MEDROL) injection  40 mg Intravenous Q12H  . sodium chloride flush  10-40 mL Intracatheter Q12H  . torsemide  40 mg Oral BID   acetaminophen, alum & mag hydroxide-simeth, docusate sodium, ipratropium-albuterol, morphine injection, sodium chloride, sodium chloride flush  Assessment/ Plan:  32 y.o. black male with diabetes mellitus type 1, HTN, CAD, PVD s/p right AKA, HLD admitted to [facility] on 07/29/17 for evaluation of severe anascara.  1. Acute Renal Failure with hyponatremia, likely multifactorial including IV contrast exposure (08/01/17), DM1, cardiorenal syndrome. Patient does not appear hypovolemic and previous renal US (04/15/17) without significant abnormalities. - Na 135 (08/10/17) --> 133 (2/4) --> 129 (2/5) - Cr 3.25; GFR 27 (08/10/17) --> Cr 2.85; GFR 32 (2/4) --> Cr 2.84 (2/5). Baseline Cr 1.25, GFR >60 (06/11/17). - Continue to monitor renal function.  2. HTN with anascara / edema. H/o angioedema with lisinopril. Echo (1/31) with grade 2 diastolic dysfunction. EF 55-60% -  Agree with changing to Torsemide 40 BID. -  Low salt diet and fluid restriction to 1.2L / day.  3. DM1 with CKD, unspecified. - Hemoglobin A1C 14.1% (06/10/17)  - Diabetic education provided.   4. Proteinuria   - Likely diabetic nephropathy - UPC is 13 gm - not on ACE-I because of allergy   LOS: 14 Kamani Magnussen 2/5/201912:34 PM  [facility] [place], [place] [phone]

## 2017-08-12 NOTE — Progress Notes (Signed)
Patient ID: [name], male   DOB: 12/28/1985, 32 y.o.   MRN: [id number]  [employer] PROGRESS NOTE  Glennon Henry Lewandowskyorain Gordon:[id number] DOB: 08/16/1985 DOA: 07/29/2017 PCP: [name], Henry Daneele, MD  HPI/Subjective: Patient feeling better.  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  Urinating well.  States shortness of breath is better.  Objective: Vitals:   08/12/17 0815 08/12/17 0908  BP:  (!) 162/82  Pulse:  82  Resp:    Temp:    SpO2: 100%     Intake/Output Summary (Last 24 hours) at 08/12/2017 0945 Last data filed at 08/11/2017 2215 Gross per 24 hour  Intake 960 ml  Output 1680 ml  Net -720 ml   Filed Weights   08/11/17 0500 08/11/17 1347 08/12/17 0633  Weight: 104.6 kg (230 lb 11.2 oz) 106.7 kg (235 lb 3.2 oz) 104.8 kg (231 lb 1.6 oz)    ROS: Review of Systems  Constitutional: Negative for chills and fever.  Eyes: Negative for blurred vision.  Respiratory: Positive for cough and shortness of breath.   Cardiovascular: Negative for chest pain.  Gastrointestinal: Negative for abdominal pain, constipation, diarrhea, nausea and vomiting.  Genitourinary: Negative for dysuria.  Musculoskeletal: Negative for joint pain.       Right AKA  Neurological: Negative for dizziness and headaches.   Exam: Physical Exam  Constitutional: He is oriented to person, place, and time.  HENT:  Nose: No mucosal edema.  Mouth/Throat: No oropharyngeal exudate or posterior oropharyngeal edema.  Eyes: Conjunctivae, EOM and lids are normal. Pupils are equal, round, and reactive to light.  Neck: No JVD present. Carotid bruit is not present. No edema present. No thyroid mass and no thyromegaly present.  Cardiovascular: S1 normal and S2 normal. Exam reveals no gallop.  No murmur heard. Pulses:      Dorsalis pedis pulses are 2+ on the right side, and 2+ on the left side.  Respiratory: No respiratory distress. He has decreased breath sounds in the right  lower field and the left lower field. He has no wheezes. He has no rhonchi. He has rales in the right lower field and the left lower field.  GI: Soft. Bowel sounds are normal. He exhibits distension. There is no tenderness.  Musculoskeletal:       Left ankle: He exhibits swelling.  Lymphadenopathy:    He has no cervical adenopathy.  Neurological: He is alert and oriented to person, place, and time. No cranial nerve deficit.  Skin: Skin is warm. No rash noted. Nails show no clubbing.  Psychiatric: He has a normal mood and affect.      Data Reviewed: Basic Metabolic Panel: Recent Labs  Lab 08/06/17 0726 08/07/17 0230 08/07/17 1518 08/08/17 0210 08/09/17 [id number] 08/10/17 0406 08/11/17 0442 08/12/17 0454 08/12/17 0907  NA 132* 133* 135 134* 136 135 133*  --  129*  K 3.7 3.9 4.0 3.9 3.5 3.8 3.8 3.8 4.4  CL 98* 99* 102 100* 101 99* 94*  --  94*  CO2 22 24 23 23 24 25 26   --  26  GLUCOSE 215* 227* 75 219* 186* 179* 208*  --  191*  BUN 53* 56* 57* 60* 66* 72* 71*  --  73*  CREATININE 3.56* 3.54* 3.43* 3.43* 3.44* 3.25* 2.85*  --  2.84*  CALCIUM 7.9* 7.9* 8.3* 7.9* 8.1* 8.3* 8.1*  --  8.3*  MG  --  2.4 2.5*  --   --   --   --   --   --  PHOS 5.4*  --  5.3*  --   --   --  5.0*  --   --    Liver Function Tests: Recent Labs  Lab 08/06/17 0726 08/07/17 1518 08/11/17 0442  AST  --  27  --   ALT  --  21  --   ALKPHOS  --  84  --   BILITOT  --  0.6  --   PROT  --  5.7*  --   ALBUMIN 2.4* 2.3* 1.8*   CBC: Recent Labs  Lab 08/07/17 0230 08/07/17 1518 08/09/17 0652 08/10/17 0406 08/11/17 0442  WBC 12.3* 12.3* 17.1* 13.4* 12.8*  HGB 8.5* 8.6* 9.0* 9.0* 8.8*  HCT 25.5* 26.5* 26.8* 27.3* 26.2*  MCV 80.3 80.7 80.4 80.6 80.5  PLT 374 408 444* 446* 429   Cardiac Enzymes: Recent Labs  Lab 08/05/17 1200 08/07/17 1518 08/07/17 2000 08/08/17 0209  TROPONINI 0.08* 0.05* 0.05* 0.05*   BNP (last 3 results) Recent Labs    07/22/17 1640 07/29/17 0955 08/07/17 0230  BNP  149.0* 93.0 398.0*     CBG: Recent Labs  Lab 08/11/17 1646 08/11/17 2057 08/12/17 0018 08/12/17 0436 08/12/17 0752  GLUCAP 292* 350* 303* 189* 170*    Recent Results (from the past 240 hour(s))  MRSA PCR Screening     Status: None   Collection Time: 08/04/17  3:48 PM  Result Value Ref Range Status   MRSA by PCR NEGATIVE NEGATIVE Final    Comment:        The GeneXpert MRSA Assay (FDA approved for NASAL specimens only), is one component of a comprehensive MRSA colonization surveillance program. It is not intended to diagnose MRSA infection nor to guide or monitor treatment for MRSA infections. Performed at [facility], [address], [place], [place] [address]      Studies: No results found.  Scheduled Meds: . aspirin EC  81 mg Oral Daily  . atorvastatin  40 mg Oral q1800  . budesonide (PULMICORT) nebulizer solution  0.25 mg Nebulization BID  . carvedilol  12.5 mg Oral BID WC  . famotidine  20 mg Oral BID  . FLUoxetine  40 mg Oral Daily  . heparin injection (subcutaneous)  5,000 Units Subcutaneous Q8H  . hydrALAZINE  100 mg Oral Q8H  . insulin aspart  0-15 Units Subcutaneous Q4H  . insulin detemir  25 Units Subcutaneous QHS  . ipratropium-albuterol  3 mL Nebulization TID  . magnesium oxide  400 mg Oral BID  . mouth rinse  15 mL Mouth Rinse BID  . methylPREDNISolone (SOLU-MEDROL) injection  40 mg Intravenous Q12H  . sodium chloride flush  10-40 mL Intracatheter Q12H  . torsemide  40 mg Oral BID   Continuous Infusions: . sodium chloride Stopped (08/05/17 1730)    Assessment/Plan:  1. Acute respiratory failure secondary to fluid overload.  Patient on Lasix drip which is changed to Lasix 80 mg IV every 12 hours and diuresing better.  Will change IV Lasix to torsemide 40 mg twice daily.  Monitor daily weights 2. Acute kidney injury on chronic kidney disease stage II.  Creatinine 2.84.  Nephrology is following.  Patient is hyponatremic with sodium  129.  BMP will be checked in a.m. 3. Anasarca and nephrotic syndrome ; Lasix drip is changed to Lasix 80 mg IV every 12 hours, clinically improving will change Lasix to torsemide 40 twice daily 4. Type 2 diabetes mellitus.  On Levemir 25 units and NovoLog with meals.  Hemoglobin  A1c very elevated blood sugars better controlled here in the hospital. 5. Hypomagnesemia and hypokalemia replaced 6. Essential hypertension.  Blood pressure elevated.  Continue Lasix IV titrate medications as needed.  On Norvasc Coreg and hydralazine. 7. Hyperlipidemia unspecified on atorvastatin 8. Depression on fluoxetine 9. Healthcare associated pneumonia on Zosyn 10. Anemia of chronic disease.  Continue to follow hemoglobin. 11. Generalized weakness PT assessment  Code Status:     Code Status Orders  (From admission, onward)        Start     Ordered   07/29/17 1356  Full code  Continuous     07/29/17 1355    Code Status History    Date Active Date Inactive Code Status Order ID Comments User Context   06/09/2017 15:22 06/12/2017 17:22 Full Code [id number]  Ramonita Lab, MD Inpatient   04/14/2017 14:05 04/18/2017 20:05 Full Code [id number]  Shaune Pollack, MD Inpatient   09/09/2016 19:55 09/11/2016 18:59 Full Code [id number]  Altamese Dilling, MD Inpatient   06/01/2016 19:26 06/02/2016 18:35 Full Code [id number]  Ramonita Lab, MD Inpatient     Family Communication: As per critical care team Disposition Plan: To be determined   Consultants:  Critical care specialist  Nephrology  Psychiatry  Antibiotics:  Zosyn  Time spent: 25 minutes, care discussed with critical care specialist.  Ramonita Lab  [employer]

## 2017-08-12 NOTE — Progress Notes (Signed)
Patient taken off of bipap at this time and placed on RA.

## 2017-08-12 NOTE — Plan of Care (Signed)
Dr. Geradine Girtontacted to confirm awareness of increased troponin levels during entire stay at hospital.  Indicated possibly d/t prior Pneumo infection and current decreased kidney function.  No further treatments needed at this time.

## 2017-08-13 LAB — GLUCOSE, CAPILLARY
GLUCOSE-CAPILLARY: 238 mg/dL — AB (ref 65–99)
GLUCOSE-CAPILLARY: 266 mg/dL — AB (ref 65–99)
GLUCOSE-CAPILLARY: 342 mg/dL — AB (ref 65–99)
Glucose-Capillary: 148 mg/dL — ABNORMAL HIGH (ref 65–99)
Glucose-Capillary: 238 mg/dL — ABNORMAL HIGH (ref 65–99)
Glucose-Capillary: 291 mg/dL — ABNORMAL HIGH (ref 65–99)

## 2017-08-13 LAB — CBC
HCT: 27.2 % — ABNORMAL LOW (ref 40.0–52.0)
Hemoglobin: 9 g/dL — ABNORMAL LOW (ref 13.0–18.0)
MCH: 26.6 pg (ref 26.0–34.0)
MCHC: 33.2 g/dL (ref 32.0–36.0)
MCV: 80.2 fL (ref 80.0–100.0)
PLATELETS: 469 10*3/uL — AB (ref 150–440)
RBC: 3.39 MIL/uL — AB (ref 4.40–5.90)
RDW: 14.6 % — ABNORMAL HIGH (ref 11.5–14.5)
WBC: 15.7 10*3/uL — ABNORMAL HIGH (ref 3.8–10.6)

## 2017-08-13 LAB — BASIC METABOLIC PANEL
Anion gap: 10 (ref 5–15)
BUN: 83 mg/dL — ABNORMAL HIGH (ref 6–20)
CALCIUM: 8.1 mg/dL — AB (ref 8.9–10.3)
CO2: 25 mmol/L (ref 22–32)
CREATININE: 2.89 mg/dL — AB (ref 0.61–1.24)
Chloride: 94 mmol/L — ABNORMAL LOW (ref 101–111)
GFR, EST AFRICAN AMERICAN: 31 mL/min — AB (ref >60)
GFR, EST NON AFRICAN AMERICAN: 27 mL/min — AB (ref >60)
Glucose, Bld: 295 mg/dL — ABNORMAL HIGH (ref 65–99)
Potassium: 4.1 mmol/L (ref 3.5–5.1)
SODIUM: 129 mmol/L — AB (ref 135–145)

## 2017-08-13 MED ORDER — CARVEDILOL 25 MG PO TABS
25.0000 mg | ORAL_TABLET | Freq: Two times a day (BID) | ORAL | Status: DC
  Administered 2017-08-13 – 2017-08-14 (×2): 25 mg via ORAL
  Filled 2017-08-13 (×2): qty 1

## 2017-08-13 MED ORDER — TORSEMIDE 20 MG PO TABS
40.0000 mg | ORAL_TABLET | Freq: Every day | ORAL | Status: DC
  Administered 2017-08-14: 40 mg via ORAL
  Filled 2017-08-13: qty 2

## 2017-08-13 MED ORDER — INSULIN ASPART 100 UNIT/ML ~~LOC~~ SOLN
5.0000 [IU] | Freq: Three times a day (TID) | SUBCUTANEOUS | Status: DC
  Administered 2017-08-13 – 2017-08-14 (×3): 5 [IU] via SUBCUTANEOUS
  Filled 2017-08-13 (×2): qty 1

## 2017-08-13 MED ORDER — ALBUMIN HUMAN 25 % IV SOLN
12.5000 g | Freq: Four times a day (QID) | INTRAVENOUS | Status: DC
  Administered 2017-08-13 – 2017-08-14 (×5): 12.5 g via INTRAVENOUS
  Filled 2017-08-13 (×7): qty 50

## 2017-08-13 MED ORDER — PREDNISONE 50 MG PO TABS
50.0000 mg | ORAL_TABLET | Freq: Every day | ORAL | Status: DC
Start: 2017-08-14 — End: 2017-08-14
  Administered 2017-08-14: 50 mg via ORAL
  Filled 2017-08-13: qty 1

## 2017-08-13 NOTE — Progress Notes (Signed)
PT Cancellation Note  Patient Details Name: [name] MRN: [id number] DOB: 05/06/1986   Cancelled Treatment:    Reason Eval/Treat Not Completed: Patient declined, no reason specified;Fatigue/lethargy limiting ability to participate. Treatment attempted; pt soundly sleeping, but awoken after several attempts. Despite encouragement, pt refuses PT at this time, noting fatigue; also states prosthesis will still not don. Pt notes he does have a w/c to for use at home. Re attempt at a later time/date, as the schedule allows.   Scot DockHeidi E Johaan Ryser, PTA 08/13/2017, 11:32 AM

## 2017-08-13 NOTE — Progress Notes (Signed)
Inpatient Diabetes Program Recommendations  AACE/ADA: New Consensus Statement on Inpatient Glycemic Control (2015)  Target Ranges:  Prepandial:   less than 140 mg/dL      Peak postprandial:   less than 180 mg/dL (1-2 hours)      Critically ill patients:  140 - 180 mg/dL   Lab Results  Component Value Date   GLUCAP 148 (H) 08/13/2017   HGBA1C 14.1 (H) 06/10/2017    Review of Glycemic ControlResults for Hinton DyerORAIN, Jacobe (MRN [id number]) as of 08/13/2017 10:17  Ref. Range 08/12/2017 04:36 08/12/2017 07:52 08/12/2017 11:53 08/12/2017 17:10 08/12/2017 20:06 08/12/2017 23:57 08/13/2017 04:10 08/13/2017 08:10  Glucose-Capillary Latest Ref Range: 65 - 99 mg/dL 409189 (H) 811170 (H) 914237 (H) 252 (H) 279 (H) 364 (H) 266 (H) 148 (H)   History:Type 2DM Home DM Meds: Levemir 30 units QHS Novolog 10 units TID Current Orders:NovologmoderateCorrection Scale/ SSI(0-15 units)q4h  Levemir 25units QHS, Prednisone 50 mg daily  Inpatient Diabetes Program Recommendations:   Note PO Prednisone started which will likely increase post-prandial blood sugars.  Please consider adding Novolog meal coverage 5 units tid with meals (hold if patient eats less than 50%).   Thanks,  Beryl MeagerJenny Benita Boonstra, RN, BC-ADM Inpatient Diabetes Coordinator Pager [phone] (8a-5p)

## 2017-08-13 NOTE — Progress Notes (Signed)
Patient ID: [name], male   DOB: [birth year], 32 y.o.   MRN: [id number]  [employer] PROGRESS NOTE  [name] [name]:[id number] DOB: Sep 10, 1985 DOA: 07/29/2017 PCP: Oswaldo Conroy, MD  HPI/Subjective: Patient feeling better.  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  Urine output 4 L yesterday  Objective: Vitals:   08/13/17 0835 08/13/17 0838  BP: (!) 165/85   Pulse: 91   Resp: 17   Temp: 99.2 F (37.3 C)   SpO2: 97% 97%    Intake/Output Summary (Last 24 hours) at 08/13/2017 1427 Last data filed at 08/13/2017 1400 Gross per 24 hour  Intake 940 ml  Output 4175 ml  Net -3235 ml   Filed Weights   08/11/17 1347 08/12/17 0633 08/13/17 9562  Weight: 106.7 kg (235 lb 3.2 oz) 104.8 kg (231 lb 1.6 oz) 106.6 kg (235 lb)    ROS: Review of Systems  Constitutional: Negative for chills and fever.  Eyes: Negative for blurred vision.  Respiratory: Negative for cough and shortness of breath.   Cardiovascular: Negative for chest pain.  Gastrointestinal: Negative for abdominal pain, constipation, diarrhea, nausea and vomiting.  Genitourinary: Negative for dysuria.  Musculoskeletal: Negative for joint pain.       Right AKA  Neurological: Negative for dizziness and headaches.   Exam: Physical Exam  Constitutional: He is oriented to person, place, and time.  HENT:  Nose: No mucosal edema.  Mouth/Throat: No oropharyngeal exudate or posterior oropharyngeal edema.  Eyes: Conjunctivae, EOM and lids are normal. Pupils are equal, round, and reactive to light.  Neck: No JVD present. Carotid bruit is not present. No edema present. No thyroid mass and no thyromegaly present.  Cardiovascular: S1 normal and S2 normal. Exam reveals no gallop.  No murmur heard. Pulses:      Dorsalis pedis pulses are 2+ on the right side, and 2+ on the left side.  Respiratory: No respiratory distress. He has decreased breath sounds in the right lower field and  the left lower field. He has no rhonchi. He has no rales.  GI: Soft. Bowel sounds are normal. He exhibits no distension. There is no tenderness.  Musculoskeletal:       Left ankle: He exhibits swelling.  Lymphadenopathy:    He has no cervical adenopathy.  Neurological: He is alert and oriented to person, place, and time. No cranial nerve deficit.  Skin: Skin is warm. No rash noted. Nails show no clubbing.  Psychiatric: He has a normal mood and affect.      Data Reviewed: Basic Metabolic Panel: Recent Labs  Lab 08/07/17 0230 08/07/17 1518  08/09/17 0436 08/10/17 0406 08/11/17 0442 08/12/17 0454 08/12/17 0907 08/13/17 0322  NA 133* 135   < > 136 135 133*  --  129* 129*  K 3.9 4.0   < > 3.5 3.8 3.8 3.8 4.4 4.1  CL 99* 102   < > 101 99* 94*  --  94* 94*  CO2 24 23   < > 24 25 26   --  26 25  GLUCOSE 227* 75   < > 186* 179* 208*  --  191* 295*  BUN 56* 57*   < > 66* 72* 71*  --  73* 83*  CREATININE 3.54* 3.43*   < > 3.44* 3.25* 2.85*  --  2.84* 2.89*  CALCIUM 7.9* 8.3*   < > 8.1* 8.3* 8.1*  --  8.3* 8.1*  MG 2.4 2.5*  --   --   --   --   --   --   --  PHOS  --  5.3*  --   --   --  5.0*  --   --   --    < > = values in this interval not displayed.   Liver Function Tests: Recent Labs  Lab 08/07/17 1518 08/11/17 0442  AST 27  --   ALT 21  --   ALKPHOS 84  --   BILITOT 0.6  --   PROT 5.7*  --   ALBUMIN 2.3* 1.8*   CBC: Recent Labs  Lab 08/07/17 1518 08/09/17 0652 08/10/17 0406 08/11/17 0442 08/13/17 0322  WBC 12.3* 17.1* 13.4* 12.8* 15.7*  HGB 8.6* 9.0* 9.0* 8.8* 9.0*  HCT 26.5* 26.8* 27.3* 26.2* 27.2*  MCV 80.7 80.4 80.6 80.5 80.2  PLT 408 444* 446* 429 469*   Cardiac Enzymes: Recent Labs  Lab 08/07/17 1518 08/07/17 2000 08/08/17 0209  TROPONINI 0.05* 0.05* 0.05*   BNP (last 3 results) Recent Labs    07/22/17 1640 07/29/17 0955 08/07/17 0230  BNP 149.0* 93.0 398.0*     CBG: Recent Labs  Lab 08/12/17 2006 08/12/17 2357 08/13/17 0410  08/13/17 0810 08/13/17 1156  GLUCAP 279* 364* 266* 148* 238*    Recent Results (from the past 240 hour(s))  MRSA PCR Screening     Status: None   Collection Time: 08/04/17  3:48 PM  Result Value Ref Range Status   MRSA by PCR NEGATIVE NEGATIVE Final    Comment:        The GeneXpert MRSA Assay (FDA approved for NASAL specimens only), is one component of a comprehensive MRSA colonization surveillance program. It is not intended to diagnose MRSA infection nor to guide or monitor treatment for MRSA infections. Performed at [facility], [address] [place], [name] [address]      Studies: No results found.  Scheduled Meds: . aspirin EC  81 mg Oral Daily  . atorvastatin  40 mg Oral q1800  . budesonide (PULMICORT) nebulizer solution  0.25 mg Nebulization BID  . carvedilol  12.5 mg Oral BID WC  . famotidine  20 mg Oral BID  . FLUoxetine  40 mg Oral Daily  . heparin injection (subcutaneous)  5,000 Units Subcutaneous Q8H  . hydrALAZINE  100 mg Oral Q8H  . insulin aspart  0-15 Units Subcutaneous Q4H  . insulin aspart  5 Units Subcutaneous TID WC  . insulin detemir  25 Units Subcutaneous QHS  . ipratropium-albuterol  3 mL Nebulization BID  . magnesium oxide  400 mg Oral BID  . mouth rinse  15 mL Mouth Rinse BID  . [START ON 08/14/2017] predniSONE  50 mg Oral Q breakfast  . sodium chloride flush  10-40 mL Intracatheter Q12H  . [START ON 08/14/2017] torsemide  40 mg Oral Daily   Continuous Infusions: . sodium chloride Stopped (08/05/17 1730)  . albumin human      Assessment/Plan:  1. Acute respiratory failure secondary to fluid overload.  Patient on Lasix drip which is changed to Lasix 80 mg IV every 12 hours and diuresing better.   discontinued IV Lasix.  Patient is started on torsemide 40 mg twice daily.  Monitor daily weights.  Patient had 4 L of urine output yesterday 2. Acute kidney injury on chronic kidney disease stage II.  Creatinine 2.84.  Nephrology is  following.  Patient is hyponatremic with sodium 129.  BMP will be checked in a.m. 3. Anasarca and nephrotic syndrome ; Lasix drip is changed to  torsemide 40 twice daily 4. Type  2 diabetes mellitus.  On Levemir 25 units and NovoLog with meals.  Hemoglobin A1c very elevated blood sugars better controlled here in the hospital. 5. Hypomagnesemia and hypokalemia replaced 6. Essential hypertension.  Blood pressure elevated.   On Coreg and hydralazine.  Coreg dose increased to 25 mg twice a day, monitor blood pressure and titrate as needed 7. Hyperlipidemia unspecified on atorvastatin 8. Depression on fluoxetine 9. Healthcare associated pneumonia on Zosyn 10. Anemia of chronic disease.  Continue to follow hemoglobin. 11. Generalized weakness PT -recommending outpatient physical therapy.  Patient is refusing to use crutches though he is having difficulty to fit his prosthesis from lower extremity swelling  Code Status:     Code Status Orders  (From admission, onward)        Start     Ordered   07/29/17 1356  Full code  Continuous     07/29/17 1355    Code Status History    Date Active Date Inactive Code Status Order ID Comments User Context   06/09/2017 15:22 06/12/2017 17:22 Full Code [id number]  Ramonita Lab, MD Inpatient   04/14/2017 14:05 04/18/2017 20:05 Full Code [id number]  Shaune Pollack, MD Inpatient   09/09/2016 19:55 09/11/2016 18:59 Full Code [id number]  Altamese Dilling, MD Inpatient   06/01/2016 19:26 06/02/2016 18:35 Full Code [id number]  Ramonita Lab, MD Inpatient     Family Communication: As per critical care team Disposition Plan: To be determined   Consultants:  Critical care specialist  Nephrology  Psychiatry  Antibiotics:  Zosyn  Time spent: 25 minutes, care discussed with critical care specialist.  Ramonita Lab  [employer]

## 2017-08-13 NOTE — Progress Notes (Signed)
[facility] [place], [name] 08/13/17  Subjective:   Nutrition team educated patient. Patient reports that he plans to eat at home more often and agrees to limit daily fluid intake. He reports that his right leg prosthesis no longer fits him due to edema but otherwise has no complaints. UOP 4050 cc S Creatinine is about the same as yesterday  Objective:  Vital signs in last 24 hours:  Temp:  [98.3 F (36.8 C)-99.2 F (37.3 C)] 99.2 F (37.3 C) (02/06 0835) Pulse Rate:  [85-94] 91 (02/06 0835) Resp:  [17-18] 17 (02/06 0835) BP: (152-168)/(78-94) 165/85 (02/06 0835) SpO2:  [94 %-99 %] 97 % (02/06 0838) Weight:  [106.6 kg (235 lb)] 106.6 kg (235 lb) (02/06 [id number])  Weight change: -0.091 kg (-3.2 oz) Filed Weights   08/11/17 1347 08/12/17 0633 08/13/17 [id number]  Weight: 106.7 kg (235 lb 3.2 oz) 104.8 kg (231 lb 1.6 oz) 106.6 kg (235 lb)    Intake/Output:    Intake/Output Summary (Last 24 hours) at 08/13/2017 1326 Last data filed at 08/13/2017 1200 Gross per 24 hour  Intake 340 ml  Output 4175 ml  Net -3835 ml     Physical Exam: General: Laying in bed watching TV.   HEENT Normocephalic and atraumatic. Moist oral mucous membranes.  Neck No distended neck veins.  Pulm/lungs B/l reduced breath sounds at bases.  CVS/Heart RRR with no m/r/g.  Abdomen:  Soft and nontender.  Extremities: Righ AKA. Pitting edema b/l.   Neurologic: Alert and oriented. Normal speech.  Skin: Left leg with darker skin d/t venous stasis changes.          Basic Metabolic Panel:  Recent Labs  Lab 08/07/17 0230 08/07/17 1518  08/09/17 0436 08/10/17 0406 08/11/17 0442 08/12/17 0454 08/12/17 0907 08/13/17 0322  NA 133* 135   < > 136 135 133*  --  129* 129*  K 3.9 4.0   < > 3.5 3.8 3.8 3.8 4.4 4.1  CL 99* 102   < > 101 99* 94*  --  94* 94*  CO2 24 23   < > 24 25 26   --  26 25  GLUCOSE 227* 75   < > 186* 179* 208*  --  191* 295*  BUN 56* 57*   < > 66* 72* 71*  --  73* 83*   CREATININE 3.54* 3.43*   < > 3.44* 3.25* 2.85*  --  2.84* 2.89*  CALCIUM 7.9* 8.3*   < > 8.1* 8.3* 8.1*  --  8.3* 8.1*  MG 2.4 2.5*  --   --   --   --   --   --   --   PHOS  --  5.3*  --   --   --  5.0*  --   --   --    < > = values in this interval not displayed.     CBC: Recent Labs  Lab 08/07/17 1518 08/09/17 0652 08/10/17 0406 08/11/17 0442 08/13/17 0322  WBC 12.3* 17.1* 13.4* 12.8* 15.7*  HGB 8.6* 9.0* 9.0* 8.8* 9.0*  HCT 26.5* 26.8* 27.3* 26.2* 27.2*  MCV 80.7 80.4 80.6 80.5 80.2  PLT 408 444* 446* 429 469*      Lab Results  Component Value Date   HEPBSAG Negative 04/15/2017   HEPBSAB Non Reactive 04/15/2017      Microbiology:  Recent Results (from the past 240 hour(s))  MRSA PCR Screening     Status: None   Collection Time: 08/04/17  3:48 PM  Result Value Ref Range Status   MRSA by PCR NEGATIVE NEGATIVE Final    Comment:        The GeneXpert MRSA Assay (FDA approved for NASAL specimens only), is one component of a comprehensive MRSA colonization surveillance program. It is not intended to diagnose MRSA infection nor to guide or monitor treatment for MRSA infections. Performed at [facility], [address] [place], [place] [address]     Coagulation Studies: No results for input(s): LABPROT, INR in the last 72 hours.  Urinalysis: Recent Labs    08/11/17 1519  COLORURINE STRAW*  LABSPEC 1.010  PHURINE 6.0  GLUCOSEU 150*  HGBUR NEGATIVE  BILIRUBINUR NEGATIVE  KETONESUR NEGATIVE  PROTEINUR >=300*  NITRITE NEGATIVE  LEUKOCYTESUR NEGATIVE      Imaging: No results found.   Medications:   . sodium chloride Stopped (08/05/17 1730)  . albumin human     . aspirin EC  81 mg Oral Daily  . atorvastatin  40 mg Oral q1800  . budesonide (PULMICORT) nebulizer solution  0.25 mg Nebulization BID  . carvedilol  12.5 mg Oral BID WC  . famotidine  20 mg Oral BID  . FLUoxetine  40 mg Oral Daily  . heparin injection (subcutaneous)   5,000 Units Subcutaneous Q8H  . hydrALAZINE  100 mg Oral Q8H  . insulin aspart  0-15 Units Subcutaneous Q4H  . insulin detemir  25 Units Subcutaneous QHS  . ipratropium-albuterol  3 mL Nebulization BID  . magnesium oxide  400 mg Oral BID  . mouth rinse  15 mL Mouth Rinse BID  . [START ON 08/14/2017] predniSONE  50 mg Oral Q breakfast  . sodium chloride flush  10-40 mL Intracatheter Q12H  . [START ON 08/14/2017] torsemide  40 mg Oral Daily   acetaminophen, alum & mag hydroxide-simeth, docusate sodium, ipratropium-albuterol, morphine injection, sodium chloride, sodium chloride flush  Assessment/ Plan:  32 y.o. black male with diabetes mellitus type 1, HTN, CAD, PVD s/p right AKA, HLD admitted to [facility] on 07/29/17 for evaluation of severe anascara.  1. Acute Renal Failure with hyponatremia, likely multifactorial including IV contrast exposure (08/01/17), DM1, cardiorenal syndrome. Patient does not appear hypovolemic and previous renal US (04/15/17) without significant abnormalities. - Na 135 (08/10/17) --> 133 (2/4) --> 129 (2/5) - Cr 3.25; GFR 27 (08/10/17) --> Cr 2.85; GFR 32 (2/4) --> Cr 2.84 (2/5).  -  Baseline Cr 1.25, GFR >60 (06/11/17). - Continue to monitor renal function.  2. HTN with anascara / edema. H/o angioedema with lisinopril. Echo (1/31) with grade 2 diastolic dysfunction. EF 55-60% -  reduce Torsemide to 40 daily -  Add Albumin  -  Low salt diet and fluid restriction to 1.2L / day.  3. DM1 with CKD, unspecified. - Hemoglobin A1C 14.1% (06/10/17)  - Diabetic education provided.   4. Proteinuria   - Likely diabetic nephropathy - UPC is 13 gm - not on ACE-I because of allergy   LOS: 15 Ilse Billman 2/6/20191:26 PM  [facility] [place], [place] [phone]

## 2017-08-14 LAB — BASIC METABOLIC PANEL
Anion gap: 12 (ref 5–15)
BUN: 79 mg/dL — ABNORMAL HIGH (ref 6–20)
CHLORIDE: 95 mmol/L — AB (ref 101–111)
CO2: 24 mmol/L (ref 22–32)
Calcium: 8 mg/dL — ABNORMAL LOW (ref 8.9–10.3)
Creatinine, Ser: 2.59 mg/dL — ABNORMAL HIGH (ref 0.61–1.24)
GFR calc non Af Amer: 31 mL/min — ABNORMAL LOW (ref >60)
GFR, EST AFRICAN AMERICAN: 35 mL/min — AB (ref >60)
GLUCOSE: 146 mg/dL — AB (ref 65–99)
Potassium: 3.7 mmol/L (ref 3.5–5.1)
Sodium: 131 mmol/L — ABNORMAL LOW (ref 135–145)

## 2017-08-14 LAB — GLUCOSE, CAPILLARY
GLUCOSE-CAPILLARY: 103 mg/dL — AB (ref 65–99)
GLUCOSE-CAPILLARY: 162 mg/dL — AB (ref 65–99)
Glucose-Capillary: 167 mg/dL — ABNORMAL HIGH (ref 65–99)

## 2017-08-14 MED ORDER — MAGNESIUM OXIDE 400 (241.3 MG) MG PO TABS: 400.0000 mg | ORAL_TABLET | Freq: Every day | ORAL | 0 refills | Status: AC

## 2017-08-14 MED ORDER — FLUOXETINE HCL 40 MG PO CAPS: 40.0000 mg | ORAL_CAPSULE | Freq: Every day | ORAL | 0 refills | Status: AC

## 2017-08-14 MED ORDER — ATORVASTATIN CALCIUM 40 MG PO TABS: 40.0000 mg | ORAL_TABLET | Freq: Every day | ORAL | 0 refills | Status: AC

## 2017-08-14 MED ORDER — TORSEMIDE 20 MG PO TABS: 40.0000 mg | ORAL_TABLET | Freq: Every day | ORAL | 0 refills | Status: AC

## 2017-08-14 MED ORDER — CARVEDILOL 25 MG PO TABS: 25.0000 mg | ORAL_TABLET | Freq: Two times a day (BID) | ORAL | 0 refills | Status: AC

## 2017-08-14 MED ORDER — HYDRALAZINE HCL 100 MG PO TABS: 100.0000 mg | ORAL_TABLET | Freq: Three times a day (TID) | ORAL | 0 refills | Status: AC

## 2017-08-14 MED ORDER — INSULIN DETEMIR 100 UNIT/ML ~~LOC~~ SOLN: 25.0000 [IU] | Freq: Every day | SUBCUTANEOUS | 11 refills | Status: AC

## 2017-08-14 MED ORDER — POTASSIUM CHLORIDE CRYS ER 10 MEQ PO TBCR: 10.0000 meq | EXTENDED_RELEASE_TABLET | Freq: Every day | ORAL | 0 refills | Status: AC

## 2017-08-14 MED ORDER — INSULIN ASPART 100 UNIT/ML ~~LOC~~ SOLN: 10.0000 [IU] | Freq: Three times a day (TID) | SUBCUTANEOUS | Status: AC

## 2017-08-14 NOTE — Discharge Summary (Signed)
[employer] - [place] [facility]   PATIENT NAME: [name]    MR#:  [id number]  DATE OF BIRTH:  Mar 05, 1986  DATE OF ADMISSION:  07/29/2017 ADMITTING PHYSICIAN: Altamese Dilling, MD  DATE OF DISCHARGE: 08/14/2017  3:59 PM  PRIMARY CARE PHYSICIAN: Oswaldo Conroy, MD    ADMISSION DIAGNOSIS:  Anasarca [R60.1] Hyperglycemia [R73.9]  DISCHARGE DIAGNOSIS:  Principal Problem:   Adjustment disorder with mixed anxiety and depressed mood Active Problems:   Anasarca   SECONDARY DIAGNOSIS:   Past Medical History:  Diagnosis Date  . Abscess of groin, right 09/09/2016  . 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 Acute gastroenteritis 09/11/2016  . AKI (acute kidney injury) (HCC) 02/19/2013  . Anemia 12/14/2015  . Dehydration 09/11/2016  . Diabetes mellitus without complication (HCC)   . Diabetic acidosis without coma (HCC) 06/01/2016  . Elevated bilirubin 09/11/2016  . Essential hypertension with goal blood pressure less than 130/80 02/07/2015  . Hypercholesterolemia 02/25/2013  . Hyponatremia 09/11/2016  . Hypophosphatemia 12/14/2015  . Hypovitaminosis D 12/16/2015  . Mass of right inguinal region   . Microcytic anemia 11/17/2014  . Non-ST elevation myocardial infarction (NSTEMI) (HCC) 02/19/2013  . Poorly controlled type 1 diabetes mellitus (HCC) 03/03/2012   Overview:  Diagnosed 02/2012.  Now insulin-dependent  . Proteinuria 12/14/2015    HOSPITAL COURSE:   1.  Anasarca and nephrotic syndrome.  The patient was brought into the hospital and started on Lasix drip.  He was on Lasix drip most of the hospital stay and then switched over to torsemide and then torsemide dose was decreased to 40 mg daily upon discharge home.  Patient diuresed very well during the hospital course. 2.  Acute respiratory failure secondary to fluid overload, healthcare associated pneumonia.  This has improved during the hospital course and now breathing comfortably on  room air.  Steroids tapered off. 3.  Acute kidney injury on chronic kidney disease stage II.  Patient had a CT scan with contrast to rule out pulmonary embolism for his shortness of breath.  This was ruled out but his creatinine worsened.  Creatinine peaked at 3.56.  Creatinine is down to 2.59 upon discharge home.  Follow-up with nephrology as outpatient. 4.  Type 2 diabetes mellitus.  Hemoglobin A1c is very elevated.  His sugars were under better control during the hospital course.  This suggest noncompliance as outpatient.  Levemir 25 units twice daily pen prescribed NovoLog prior to meals prescribed 5.  Hypokalemia and hypomagnesemia replaced during the hospital course and prescribed upon discharge home 6.  Accelerated hypertension at times during the hospital course.  On Coreg and hydralazine 7.  Hyperlipidemia unspecified on atorvastatin 8.  Depression on fluoxetine 9.  Healthcare associated pneumonia treated during the hospital course with Zosyn. 10.  Anemia of chronic disease.  Hemoglobin 9.0 on discharge. 11.  Weakness.  PT recommended outpatient physical therapy.  He will have to get back to his prosthesis specialist to get another fitting.  Walker and crutches prescribed.  DISCHARGE CONDITIONS:   Fair  CONSULTS OBTAINED:  Treatment Team:  Mady Haagensen, MD Clapacs, Jackquline Denmark, MD  DRUG ALLERGIES:   Allergies  Allergen Reactions  . Lisinopril Swelling    DISCHARGE MEDICATIONS:   Allergies as of 08/14/2017      Reactions   Lisinopril Swelling      Medication List    STOP taking these medications   furosemide 40 MG tablet Commonly known as:  LASIX  TAKE these medications   aspirin EC 81 MG tablet Take 1 tablet (81 mg total) by mouth daily.   atorvastatin 40 MG tablet Commonly known as:  LIPITOR Take 1 tablet (40 mg total) by mouth daily at 6 PM.   carvedilol 25 MG tablet Commonly known as:  COREG Take 1 tablet (25 mg total) by mouth 2 (two) times daily with a  meal.   FLUoxetine 40 MG capsule Commonly known as:  PROZAC Take 1 capsule (40 mg total) by mouth daily.   hydrALAZINE 100 MG tablet Commonly known as:  APRESOLINE Take 1 tablet (100 mg total) by mouth every 8 (eight) hours. What changed:    medication strength  how much to take   insulin aspart 100 UNIT/ML injection Commonly known as:  novoLOG Inject 10 Units into the skin 3 (three) times daily with meals.   insulin detemir 100 UNIT/ML injection Commonly known as:  LEVEMIR Inject 0.25 mLs (25 Units total) into the skin at bedtime. What changed:  how much to take   magnesium oxide 400 (241.3 Mg) MG tablet Commonly known as:  MAG-OX Take 1 tablet (400 mg total) by mouth daily.   potassium chloride 10 MEQ tablet Commonly known as:  K-DUR,KLOR-CON Take 1 tablet (10 mEq total) by mouth daily. What changed:    medication strength  how much to take  when to take this   torsemide 20 MG tablet Commonly known as:  DEMADEX Take 2 tablets (40 mg total) by mouth daily.            Durable Medical Equipment  (From admission, onward)        Start     Ordered   08/14/17 0805  For home use only DME Crutches  Once     08/14/17 0804       DISCHARGE INSTRUCTIONS:   Follow-up PMD 1 week Follow-up nephrology 1 week for labs  If you experience worsening of your admission symptoms, develop shortness of breath, life threatening emergency, suicidal or homicidal thoughts you must seek medical attention immediately by calling 911 or calling your MD immediately  if symptoms less severe.  You Must read complete instructions/literature along with all the possible adverse reactions/side effects for all the Medicines you take and that have been prescribed to you. Take any new Medicines after you have completely understood and accept all the possible adverse reactions/side effects.   Please note  You were cared for by a hospitalist during your hospital stay. If you have any  questions about your discharge medications or the care you received while you were in the hospital after you are discharged, you can call the unit and asked to speak with the hospitalist on call if the hospitalist that took care of you is not available. Once you are discharged, your primary care physician will handle any further medical issues. Please note that NO REFILLS for any discharge medications will be authorized once you are discharged, as it is imperative that you return to your primary care physician (or establish a relationship with a primary care physician if you do not have one) for your aftercare needs so that they can reassess your need for medications and monitor your lab values.    Today   CHIEF COMPLAINT:   Chief Complaint  Patient presents with  . Chest Pain    HISTORY OF PRESENT ILLNESS:  [name]  is a 32 y.o. male presented with chest pain and found to have anasarca  VITAL SIGNS:  Blood pressure (!) 154/88, pulse 84, temperature 98.8 F (37.1 C), temperature source Oral, resp. rate 18, height 5\' 6"  (1.676 m), weight 101.2 kg (223 lb), SpO2 96 %.    PHYSICAL EXAMINATION:  GENERAL:  32 y.o.-year-old patient lying in the bed with no acute distress.  EYES: Pupils equal, round, reactive to light and accommodation. No scleral icterus. Extraocular muscles intact.  HEENT: Head atraumatic, normocephalic. Oropharynx and nasopharynx clear.  NECK:  Supple, no jugular venous distention. No thyroid enlargement, no tenderness.  LUNGS: Normal breath sounds bilaterally, no wheezing, rales,rhonchi or crepitation. No use of accessory muscles of respiration.  CARDIOVASCULAR: S1, S2 normal. No murmurs, rubs, or gallops.  ABDOMEN: Soft, non-tender, non-distended. Bowel sounds present. No organomegaly or mass.  EXTREMITIES:  2+ edema, no cyanosis, or clubbing.  NEUROLOGIC: Cranial nerves II through XII are intact. Muscle strength 5/5 in all extremities. Sensation intact. Gait  not checked.  PSYCHIATRIC: The patient is alert and oriented x 3.  SKIN: Chronic lower extremity discoloration  DATA REVIEW:   CBC Recent Labs  Lab 08/13/17 0322  WBC 15.7*  HGB 9.0*  HCT 27.2*  PLT 469*    Chemistries  Recent Labs  Lab 08/14/17 0526  NA 131*  K 3.7  CL 95*  CO2 24  GLUCOSE 146*  BUN 79*  CREATININE 2.59*  CALCIUM 8.0*    Cardiac Enzymes Recent Labs  Lab 08/08/17 0209  TROPONINI 0.05*    Microbiology Results  Results for orders placed or performed during the hospital encounter of 07/29/17  CULTURE, BLOOD (ROUTINE X 2) w Reflex to ID Panel     Status: None   Collection Time: 08/01/17  5:54 PM  Result Value Ref Range Status   Specimen Description BLOOD LEFT ANTECUBITAL  Final   Special Requests   Final    BOTTLES DRAWN AEROBIC AND ANAEROBIC Blood Culture results may not be optimal due to an inadequate volume of blood received in culture bottles   Culture   Final    NO GROWTH 5 DAYS Performed at [facility], [address] [place], [place] [address]    Report Status 08/06/2017 FINAL  Final  CULTURE, BLOOD (ROUTINE X 2) w Reflex to ID Panel     Status: None   Collection Time: 08/01/17  6:03 PM  Result Value Ref Range Status   Specimen Description BLOOD BLOOD RIGHT HAND  Final   Special Requests   Final    BOTTLES DRAWN AEROBIC AND ANAEROBIC Blood Culture adequate volume   Culture   Final    NO GROWTH 5 DAYS Performed at [facility], [address] [place], [place] [address]    Report Status 08/06/2017 FINAL  Final  MRSA PCR Screening     Status: None   Collection Time: 08/04/17  3:48 PM  Result Value Ref Range Status   MRSA by PCR NEGATIVE NEGATIVE Final    Comment:        The GeneXpert MRSA Assay (FDA approved for NASAL specimens only), is one component of a comprehensive MRSA colonization surveillance program. It is not intended to diagnose MRSA infection nor to guide or monitor treatment for MRSA  infections. Performed at [facility], [address], [place], [place] [address]      Management plans discussed with the patient, and he is in agreement.  CODE STATUS:     Code Status Orders  (From admission, onward)        Start  Ordered   07/29/17 1356  Full code  Continuous     07/29/17 1355    Code Status History    Date Active Date Inactive Code Status Order ID Comments User Context   06/09/2017 15:22 06/12/2017 17:22 Full Code [id number]  Ramonita LabGouru, Aruna, MD Inpatient   04/14/2017 14:05 04/18/2017 20:05 Full Code [id number]  Shaune Pollackhen, Qing, MD Inpatient   09/09/2016 19:55 09/11/2016 18:59 Full Code [id number]  Altamese DillingVachhani, Vaibhavkumar, MD Inpatient   06/01/2016 19:26 06/02/2016 18:35 Full Code [id number]  Ramonita LabGouru, Aruna, MD Inpatient      TOTAL TIME TAKING CARE OF THIS PATIENT: 35 minutes.    Alford Highlandichard Rosaleigh Brazzel M.D on 08/14/2017 at 5:13 PM  Between 7am to 6pm - Pager - [phone]  After 6pm go to [email or web address] - password [email or web address]  [employer] Office  [phone]  CC: Primary care physician; [name], [name], MD

## 2017-08-14 NOTE — Progress Notes (Signed)
[facility], [place] 08/14/17  Subjective:   Patient reports decreased swelling in his lower extremities. He was able to fit into his prosthesis for PT today, although with the remaining edema, it feels tight and painful right now. He reports that he still has the same unproductive dry cough but slept well and has a good appetite. He also states he is in good spirits, as today is [birth year] birthday.  Nutrition team educated patient 2/6. Per case mgmt note 2/7, patient agreeable to crutches and OP PT at [facility]. UOP 4050 cc S Creatinine is slightly improved from yesterday Na up slightly from yesterday 129->131  Objective:  Vital signs in last 24 hours:  Temp:  [98.5 F (36.9 C)-98.8 F (37.1 C)] 98.8 F (37.1 C) (02/07 0417) Pulse Rate:  [88-93] 88 (02/07 0417) Resp:  [16-18] 18 (02/07 0417) BP: (141-149)/(72-82) 148/82 (02/07 0417) SpO2:  [94 %-96 %] 96 % (02/07 0417) Weight:  [101.2 kg (223 lb)] 101.2 kg (223 lb) (02/07 0417)  Weight change: -5.443 kg () Filed Weights   08/12/17 0633 08/13/17 0608 08/14/17 0417  Weight: 104.8 kg (231 lb 1.6 oz) 106.6 kg (235 lb) 101.2 kg (223 lb)    Intake/Output:    Intake/Output Summary (Last 24 hours) at 08/14/2017 1044 Last data filed at 08/14/2017 0900 Gross per 24 hour  Intake 1720 ml  Output 4000 ml  Net -2280 ml     Physical Exam: General: Sitting up watching tv.    HEENT Normocephalic and atraumatic.   Neck No distended neck veins.  Pulm/lungs B/l reduced breath sounds at bases.  CVS/Heart RRR with no m/r/g.  Abdomen:  Soft and nontender.  Extremities: Righ AKA. Improving pitting edema b/l.   Neurologic: Alert and oriented. Normal speech.  Skin: Left leg with darker skin d/t venous stasis changes.          Basic Metabolic Panel:  Recent Labs  Lab 08/07/17 1518  08/10/17 0406 08/11/17 0442 08/12/17 0454 08/12/17 0907 08/13/17 0322 08/14/17 0526  NA 135   < > 135 133*  --  129* 129* 131*  K  4.0   < > 3.8 3.8 3.8 4.4 4.1 3.7  CL 102   < > 99* 94*  --  94* 94* 95*  CO2 23   < > 25 26  --  26 25 24   GLUCOSE 75   < > 179* 208*  --  191* 295* 146*  BUN 57*   < > 72* 71*  --  73* 83* 79*  CREATININE 3.43*   < > 3.25* 2.85*  --  2.84* 2.89* 2.59*  CALCIUM 8.3*   < > 8.3* 8.1*  --  8.3* 8.1* 8.0*  MG 2.5*  --   --   --   --   --   --   --   PHOS 5.3*  --   --  5.0*  --   --   --   --    < > = values in this interval not displayed.     CBC: Recent Labs  Lab 08/07/17 1518 08/09/17 0652 08/10/17 0406 08/11/17 0442 08/13/17 0322  WBC 12.3* 17.1* 13.4* 12.8* 15.7*  HGB 8.6* 9.0* 9.0* 8.8* 9.0*  HCT 26.5* 26.8* 27.3* 26.2* 27.2*  MCV 80.7 80.4 80.6 80.5 80.2  PLT 408 444* 446* 429 469*      Lab Results  Component Value Date   HEPBSAG Negative 04/15/2017   HEPBSAB Non Reactive 04/15/2017  Microbiology:  Recent Results (from the past 240 hour(s))  MRSA PCR Screening     Status: None   Collection Time: 08/04/17  3:48 PM  Result Value Ref Range Status   MRSA by PCR NEGATIVE NEGATIVE Final    Comment:        The GeneXpert MRSA Assay (FDA approved for NASAL specimens only), is one component of a comprehensive MRSA colonization surveillance program. It is not intended to diagnose MRSA infection nor to guide or monitor treatment for MRSA infections. Performed at [facility], [address] [place], [name] [address]     Coagulation Studies: No results for input(s): LABPROT, INR in the last 72 hours.  Urinalysis: Recent Labs    08/11/17 1519  COLORURINE STRAW*  LABSPEC 1.010  PHURINE 6.0  GLUCOSEU 150*  HGBUR NEGATIVE  BILIRUBINUR NEGATIVE  KETONESUR NEGATIVE  PROTEINUR >=300*  NITRITE NEGATIVE  LEUKOCYTESUR NEGATIVE      Imaging: No results found.   Medications:   . sodium chloride Stopped (08/05/17 1730)  . albumin human     . aspirin EC  81 mg Oral Daily  . atorvastatin  40 mg Oral q1800  . budesonide (PULMICORT)  nebulizer solution  0.25 mg Nebulization BID  . carvedilol  25 mg Oral BID WC  . famotidine  20 mg Oral BID  . FLUoxetine  40 mg Oral Daily  . heparin injection (subcutaneous)  5,000 Units Subcutaneous Q8H  . hydrALAZINE  100 mg Oral Q8H  . insulin aspart  0-15 Units Subcutaneous Q4H  . insulin aspart  5 Units Subcutaneous TID WC  . insulin detemir  25 Units Subcutaneous QHS  . ipratropium-albuterol  3 mL Nebulization BID  . magnesium oxide  400 mg Oral BID  . mouth rinse  15 mL Mouth Rinse BID  . predniSONE  50 mg Oral Q breakfast  . sodium chloride flush  10-40 mL Intracatheter Q12H  . torsemide  40 mg Oral Daily   acetaminophen, alum & mag hydroxide-simeth, docusate sodium, ipratropium-albuterol, morphine injection, sodium chloride, sodium chloride flush  Assessment/ Plan:  32 y.o. black male with diabetes mellitus type 1, HTN, CAD, PVD s/p right AKA, HLD admitted [facility] 07[facility]29/17 for evaluation of severe anascara.  1. Acute Renal Failure with hyponatremia, likely multifactorial including IV contrast exposure (08/01/17), DM1, cardiorenal syndrome. Patient does not appear hypovolemic and previous renal US (04/15/17) without significant abnormalities. - Na 135 (08/10/17) --> 133 (2/4) --> 129 (2/5, 2/6) --> 131 (2/7). Sodium up slightly from yesterday. - Cr 3.25; GFR 27 (08/10/17) --> Cr 2.85; GFR 32 (2/4) --> Cr 2.84 (2/5) --> 2.89 (2/6) --> 2.59 (2/7).  -  Baseline Cr 1.25, GFR >60 (06/11/17). - Continue Torsemide 40mg  once daily once home.  2. HTN with anascara / edema. H/o angioedema with lisinopril. Echo (1/31) with grade 2 diastolic dysfunction. EF 55-60% -  Torsemide as above. -  Patient educated regarding low salt diet and fluid restriction  3. DM1 with CKD, unspecified. - Hemoglobin A1C 14.1% (06/10/17)  - Glucose 131->295-> 146 - Nutrition consult provided 2/6  4. Proteinuria   - Likely diabetic nephropathy - UPC is 13 gm (2/4) - not on ACE-I because of allergy   LOS:  16 Damiano Stamper Thedore MinsSingh 2/7/201910:44 AM  [facility] [place], [name] [phone]

## 2017-08-14 NOTE — Plan of Care (Signed)
Patient IV and tele removed.  RN assessment and VS revealed stability for DC to home.  Discharge papers printed , explained and educated.  Informed of suggested FU appts and appts made. Printed all scripts and gave to patient to take to pharm of his choice.  Once ready, will be wheeled to front and family transporting home via car.  Awaiting ride to arrive.

## 2017-08-14 NOTE — Progress Notes (Signed)
bipap refused 

## 2017-08-14 NOTE — Progress Notes (Signed)
Physical Therapy Treatment Patient Details Name: [name] MRN: [id number] DOB: 10/14/1985 Today's Date: 08/14/2017    History of Present Illness Pt admitted for anasarca with complaints of chest pain. Pt with history of AKI, DM, HTN, R AKA, and NSTEMI, and hyponatremia. Pt with recent admission for similar symptoms. Hospital stay has been complicated by severe respiratory failure and progressive cardio renal syndrom. Pt now in CCU, received new order for continued PT    PT Comments    Partial donning of prosthesis in sitting. Attempted to stand for fit with weight bearing. Significant improvement from prior session but still not a full fit. Pt is steady in standing with UE support on walker and RLE prosthesis donned. Safe hand placement demonstrated during transfers. Pt performs multiple transfers from bed and recliner throughout therapy session. Demonstrated sliding board transfers to patient to/from mat table and wheelchair. Unable to practice with patient as the arm on his recliner will not drop out properly. Pt able to ambulate approximately 20' with rolling walker and RLE prosthesis donned. Good stability and safety awareness with ambulation but pt fatigues relatively quickly. Prosthesis removed and pt ambulates with rolling walker while attempting to hop on LLE. This is very challenging for patient and not functional as he is only able to go about 3'. Attempted ambulation with axillary crutches as well but pt is not strong or stable enough to perform. Pt does not need axillary crutches and he cannot use safely. Pt has a wheelchair at home and demonstrates adequate strength for pivot transfers to/from wheelchair if needed. RLE prosthesis is also fitting better today and will allow limited ambulation. He has no stairs to navigate outside or inside the house where he stays. He would benefit from OP PT at discharge. Pt is safe to return home with support from friends when medically appropriate. No  new DME needs. Pt will benefit from PT services to address deficits in strength, balance, and mobility in order to return to full function at home.    Follow Up Recommendations  Outpatient PT     Equipment Recommendations  None recommended by PT    Recommendations for Other Services       Precautions / Restrictions Precautions Precautions: Fall Restrictions Weight Bearing Restrictions: No    Mobility  Bed Mobility Overal bed mobility: Modified Independent             General bed mobility comments: use of rail and mild increase in time but overall good sequencing once ready.  Transfers Overall transfer level: Needs assistance Equipment used: Rolling walker (2 wheeled) Transfers: Sit to/from Stand Sit to Stand: Supervision;From elevated surface         General transfer comment: Partial donning of prosthesis in sitting. Attempted to stand for fit with weight bearing. Significant improvement from prior session but still not a full fit. Pt is steady in standing with UE support on walker. Safe hand placement demonstrated. Pt performs multiple transfers from bed and recliner. Demonstrated sliding board transfers to patient to/from mat table and wheelchair. Unable to practice with patient as the arm on his recliner will not drop out properly.   Ambulation/Gait Ambulation/Gait assistance: Min guard Ambulation Distance (Feet): 20 Feet Assistive device: Rolling walker (2 wheeled) Gait Pattern/deviations: Step-to pattern     General Gait Details: Pt able to ambulate with rolling walker and RLE prosthesis donned. Good stability and safety awareness with ambulation but pt fatigues relatively quickly. Prosthesis removed and pt ambulates with rolling walker and attempting to  hop on LLE. This is very challenging for patient and not functional Attempted ambulation with axillary crutches but pt is not strong or stable enough to perform.   Stairs            Wheelchair Mobility     Modified Rankin (Stroke Patients Only)       Balance Overall balance assessment: History of Falls;Needs assistance Sitting-balance support: No upper extremity supported;Feet supported Sitting balance-Leahy Scale: Normal     Standing balance support: Bilateral upper extremity supported Standing balance-Leahy Scale: Fair Standing balance comment: Able to mantain standing balance with UE support on rolling walker                            Cognition Arousal/Alertness: Awake/alert Behavior During Therapy: WFL for tasks assessed/performed Overall Cognitive Status: Within Functional Limits for tasks assessed                                        Exercises      General Comments        Pertinent Vitals/Pain Pain Assessment: No/denies pain    Home Living                      Prior Function            PT Goals (current goals can now be found in the care plan section) Acute Rehab PT Goals Patient Stated Goal: to be less SOB PT Goal Formulation: With patient Time For Goal Achievement: 08/22/17 Potential to Achieve Goals: Good Progress towards PT goals: Progressing toward goals    Frequency    Min 2X/week      PT Plan Current plan remains appropriate    Co-evaluation              AM-PAC PT "6 Clicks" Daily Activity  Outcome Measure  Difficulty turning over in bed (including adjusting bedclothes, sheets and blankets)?: A Little Difficulty moving from lying on back to sitting on the side of the bed? : A Little Difficulty sitting down on and standing up from a chair with arms (e.g., wheelchair, bedside commode, etc,.)?: A Little Help needed moving to and from a bed to chair (including a wheelchair)?: A Little Help needed walking in hospital room?: A Little Help needed climbing 3-5 steps with a railing? : Total 6 Click Score: 16    End of Session   Activity Tolerance: Patient tolerated treatment well Patient left:  in chair;with call bell/phone within reach Nurse Communication: Mobility status;Other (comment)(Care manager notified pt does not need axillary crutches) PT Visit Diagnosis: Unsteadiness on feet (R26.81);Muscle weakness (generalized) (M62.81);Difficulty in walking, not elsewhere classified (R26.2)     Time: 0915-1000 PT Time Calculation (min) (ACUTE ONLY): 45 min  Charges:  $Gait Training: 23-37 mins $Therapeutic Activity: 8-22 mins                    G Codes:       Sharalyn Ink Huprich PT, DPT     Huprich,Jason 08/14/2017, 11:10 AM

## 2017-08-14 NOTE — Discharge Instructions (Signed)
Fluid restrciction to (1.2 Liters) per day (total fluid)    Blood Glucose Monitoring, Adult Monitoring your blood sugar (glucose) helps you manage your diabetes. It also helps you and your health care provider determine how well your diabetes management plan is working. Blood glucose monitoring involves checking your blood glucose as often as directed, and keeping a record (log) of your results over time. Why should I monitor my blood glucose? Checking your blood glucose regularly can:  Help you understand how food, exercise, illnesses, and medicines affect your blood glucose.  Let you know what your blood glucose is at any time. You can quickly tell if you are having low blood glucose (hypoglycemia) or high blood glucose (hyperglycemia).  Help you and your health care provider adjust your medicines as needed.  When should I check my blood glucose? Follow instructions from your health care provider about how often to check your blood glucose. This may depend on:  The type of diabetes you have.  How well-controlled your diabetes is.  Medicines you are taking.  If you have type 1 diabetes:  Check your blood glucose at least 2 times a day.  Also check your blood glucose: ? Before every insulin injection. ? Before and after exercise. ? Between meals. ? 2 hours after a meal. ? Occasionally between 2:00 a.m. and 3:00 a.m., as directed. ? Before potentially dangerous tasks, like driving or using heavy machinery. ? At bedtime.  You may need to check your blood glucose more often, up to 6-10 times a day: ? If you use an insulin pump. ? If you need multiple daily injections (MDI). ? If your diabetes is not well-controlled. ? If you are ill. ? If you have a history of severe hypoglycemia. ? If you have a history of not knowing when your blood glucose is getting low (hypoglycemia unawareness). If you have type 2 diabetes:  If you take insulin or other diabetes medicines,  check your blood glucose at least 2 times a day.  If you are on intensive insulin therapy, check your blood glucose at least 4 times a day. Occasionally, you may also need to check between 2:00 a.m. and 3:00 a.m., as directed.  Also check your blood glucose: ? Before and after exercise. ? Before potentially dangerous tasks, like driving or using heavy machinery.  You may need to check your blood glucose more often if: ? Your medicine is being adjusted. ? Your diabetes is not well-controlled. ? You are ill. What is a blood glucose log?  A blood glucose log is a record of your blood glucose readings. It helps you and your health care provider: ? Look for patterns in your blood glucose over time. ? Adjust your diabetes management plan as needed.  Every time you check your blood glucose, write down your result and notes about things that may be affecting your blood glucose, such as your diet and exercise for the day.  Most glucose meters store a record of glucose readings in the meter. Some meters allow you to download your records to a computer. How do I check my blood glucose? Follow these steps to get accurate readings of your blood glucose: Supplies needed   Blood glucose meter.  Test strips for your meter. Each meter has its own strips. You must use the strips that come with your meter.  A needle to prick your finger (lancet). Do not use lancets more than once.  A device that holds the lancet (lancing device).  A journal or log book to write down your results. Procedure  Wash your hands with soap and water.  Prick the side of your finger (not the tip) with the lancet. Use a different finger each time.  Gently rub the finger until a small drop of blood appears.  Follow instructions that come with your meter for inserting the test strip, applying blood to the strip, and using your blood glucose meter.  Write down your result and any notes. Alternative testing  sites  Some meters allow you to use areas of your body other than your finger (alternative sites) to test your blood.  If you think you may have hypoglycemia, or if you have hypoglycemia unawareness, do not use alternative sites. Use your finger instead.  Alternative sites may not be as accurate as the fingers, because blood flow is slower in these areas. This means that the result you get may be delayed, and it may be different from the result that you would get from your finger.  The most common alternative sites are: ? Forearm. ? Thigh. ? Palm of the hand. Additional tips  Always keep your supplies with you.  If you have questions or need help, all blood glucose meters have a 24-hour hotline number that you can call. You may also contact your health care provider.  After you use a few boxes of test strips, adjust (calibrate) your blood glucose meter by following instructions that came with your meter. This information is not intended to replace advice given to you by your health care provider. Make sure you discuss any questions you have with your health care provider. Document Released: 06/27/2003 Document Revised: 01/12/2016 Document Reviewed: 12/04/2015 Elsevier Interactive Patient Education  2017 [employer].

## 2017-08-14 NOTE — Progress Notes (Signed)
Chaplain followed up as Pt is being discharged and Chaplain prayed for his stability  And healing.    08/14/17 1000  Clinical Encounter Type  Visited With Patient  Visit Type Spiritual support  Referral From Nurse  Spiritual Encounters  Spiritual Needs Prayer

## 2017-08-14 NOTE — Care Management Note (Signed)
Case Management Note  Patient Details  Name: [name] MRN: [id number] Date of Birth: 12-24-1985  Subjective/Objective:  Met wit patient at bedside to discuss discharge plan. He is agreeable to crutches and outpatient physical therapy. Prefers [facility]. Faxed outpatient PT order to [facility] outpatient PT office. Ordered crutches from [place] with [facility]. Corene Cornea with PT to show educate patient on how to use crutches.                   Action/Plan: [facility] outpatient PT. [facility] for crutches.   Expected Discharge Date:  08/14/17               Expected Discharge Plan:  OP Rehab  In-House Referral:     Discharge planning Services  CM Consult, [facility], Medication Assistance  Post Acute Care Choice:  Durable Medical Equipment Choice offered to:  Patient  DME Arranged:  Glucometer, Crutches DME Agency:  [employer])  [place] Arranged:    [facility] Agency:     Status of Service:  Completed, signed off  If discussed at [place] of Stay Meetings, dates discussed:    Additional Comments:  Jolly Mango, RN 08/14/2017, 9:14 AM

## 2017-08-15 ENCOUNTER — Telehealth: Payer: Self-pay

## 2017-08-15 NOTE — Telephone Encounter (Signed)
Called pts phone per brothers request to try to explain elig process to pt. No answer and vm is full. I will try to reach pt again next week.

## 2017-08-18 ENCOUNTER — Telehealth: Payer: Self-pay | Admitting: Pharmacy Technician

## 2017-08-18 NOTE — Telephone Encounter (Signed)
Unable to reach patient by phone.  Left message.  Mailed new patient packet.  Sherilyn DacostaBetty J. Lark Langenfeld Care Manager [facility]

## 2018-05-08 DEATH — deceased

## 2019-04-12 IMAGING — CT CT ANGIO CHEST
3 of 7 series · 19 of 36 positions shown · IV contrast (APPLIED)
Comparison: Chest x-ray from earlier today.

CLINICAL DATA: Midsternal chest pain.

EXAM:
CT ANGIOGRAPHY CHEST WITH CONTRAST
TECHNIQUE: Multidetector CT imaging of the chest was performed using the
standard protocol during bolus administration of intravenous
contrast. Multiplanar CT image reconstructions and MIPs were
obtained to evaluate the vascular anatomy.
CONTRAST:  100mL VKPLND-YXM IOPAMIDOL (VKPLND-YXM) INJECTION 76%

[Series 5: thins · axial · 0.76mm/px · z∈[-257,+25]mm · 15 of 324 slices shown]
[im 21/324  lung]
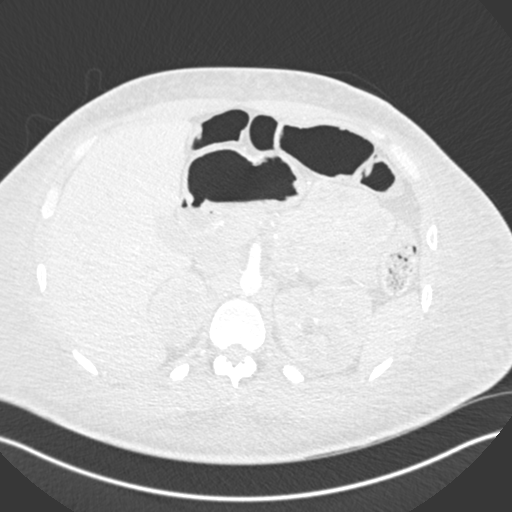
[im 41/324  mediastinal]
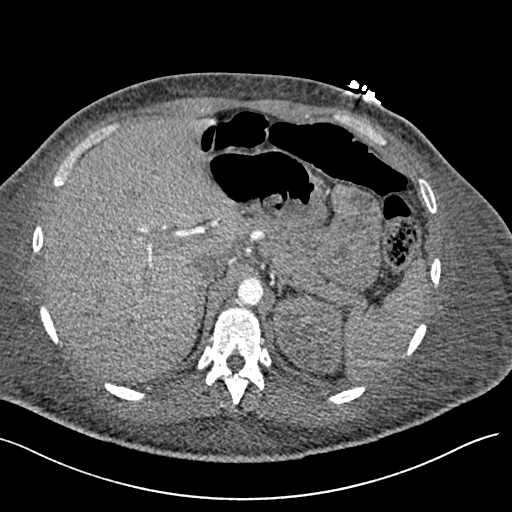
[im 61/324  lung]
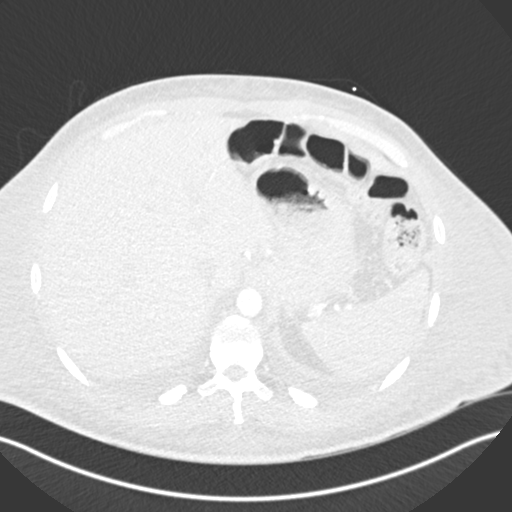
[im 81/324  mediastinal]
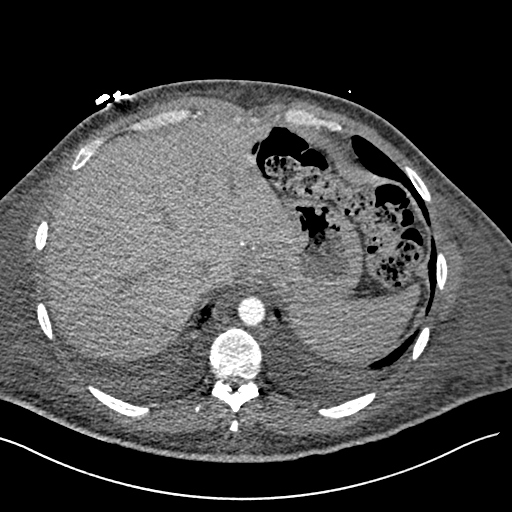
[im 101/324  lung]
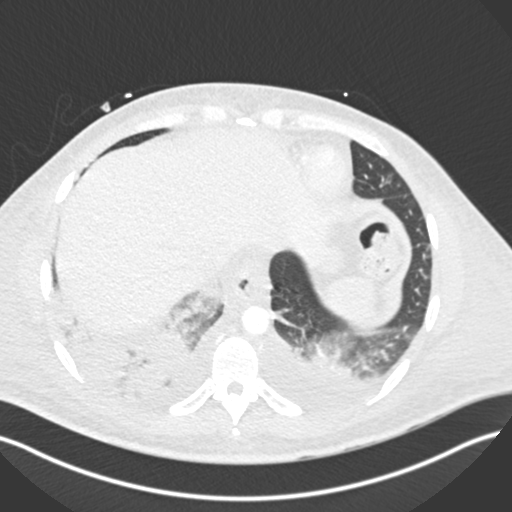
[im 122/324  mediastinal]
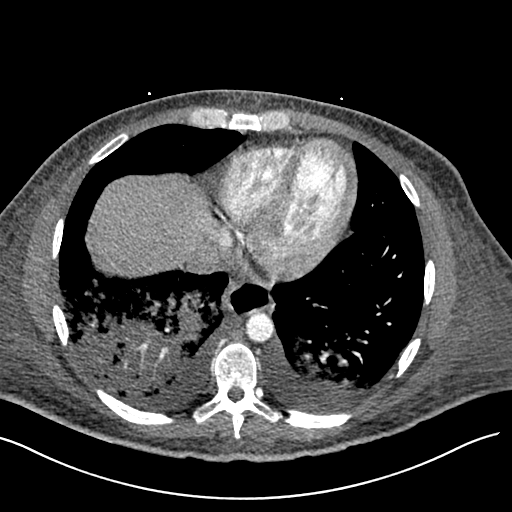
[im 142/324  lung]
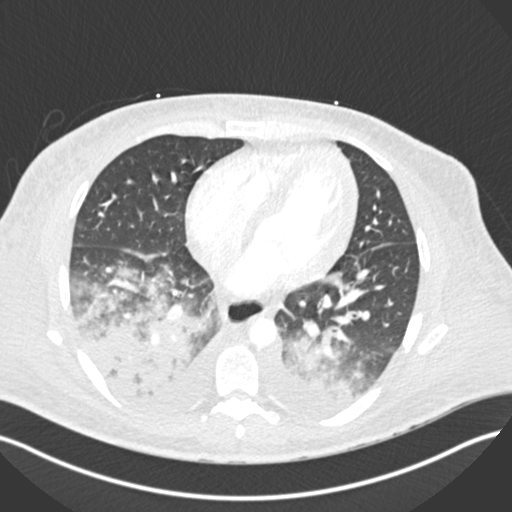
[im 162/324  mediastinal]
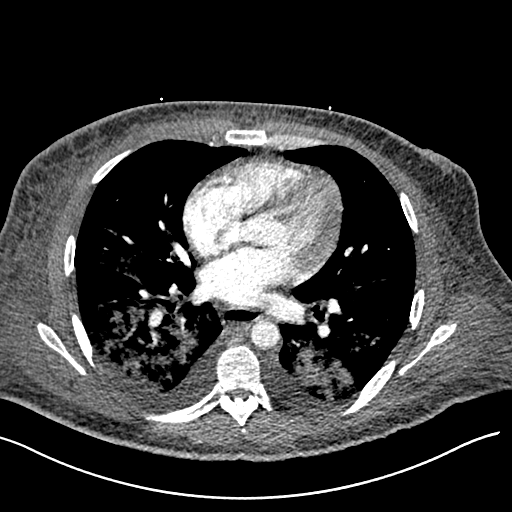
[im 182/324  lung]
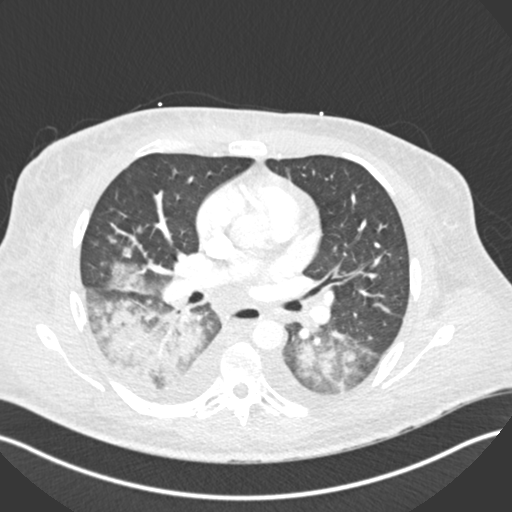
[im 202/324  mediastinal]
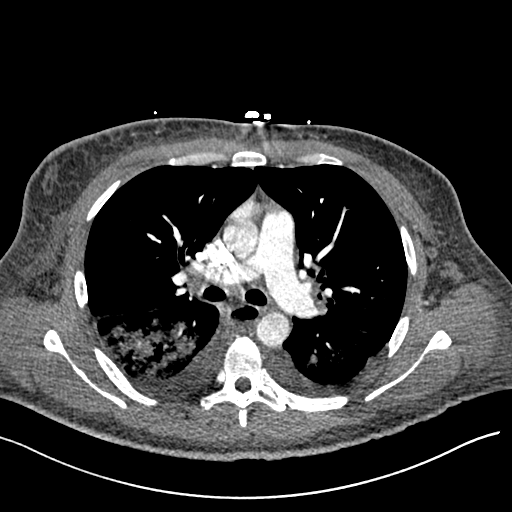
[im 223/324  lung]
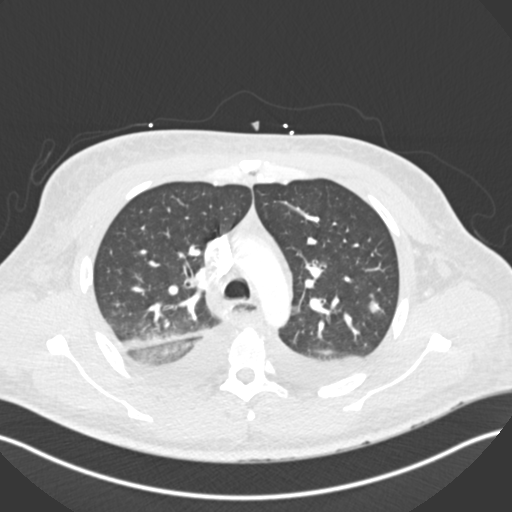
[im 243/324  mediastinal]
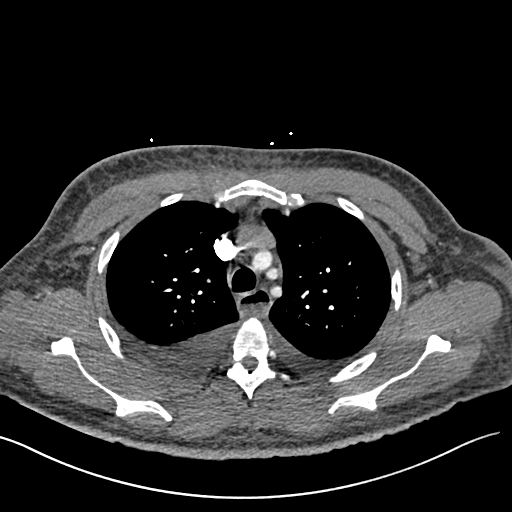
[im 263/324  lung]
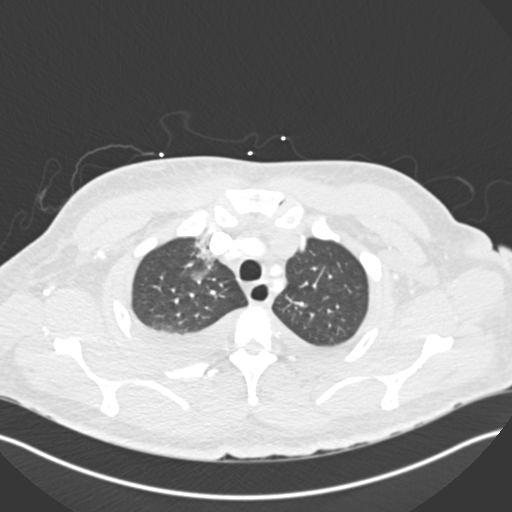
[im 283/324  mediastinal]
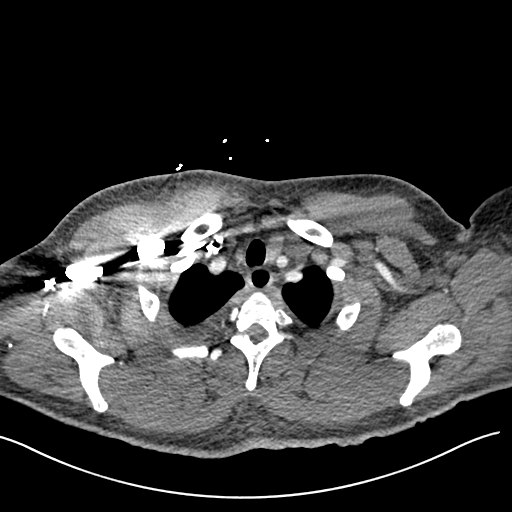
[im 303/324  lung]
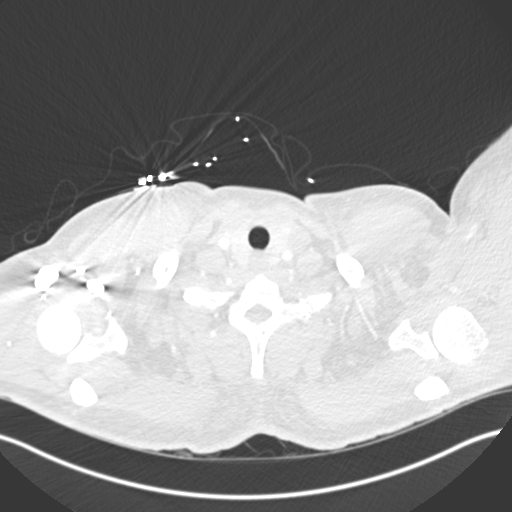

[Series 6: lung · axial · 0.76mm/px · z∈[-167,-26]mm · 3 of 95 slices shown]
[im 24/95  mediastinal]
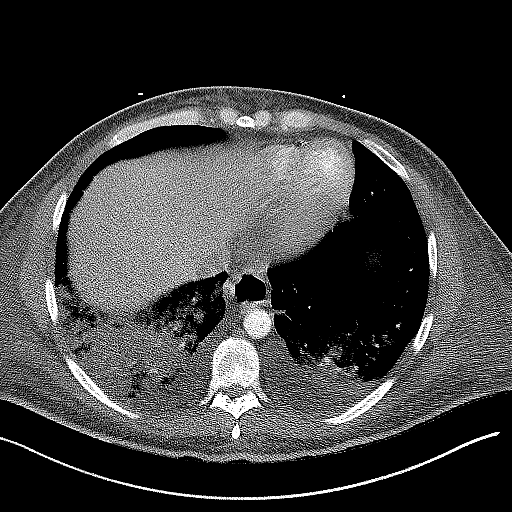
[im 48/95  mediastinal]
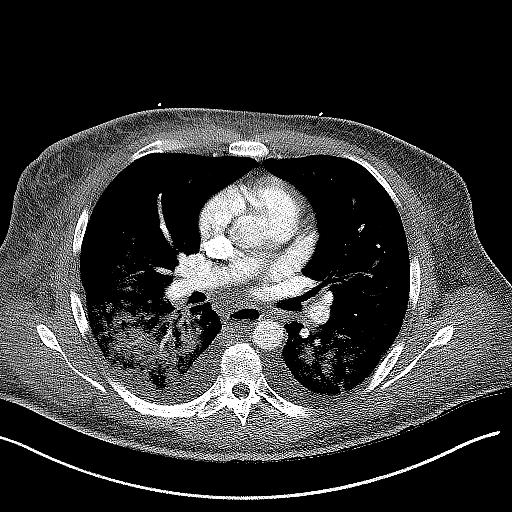
[im 71/95  mediastinal]
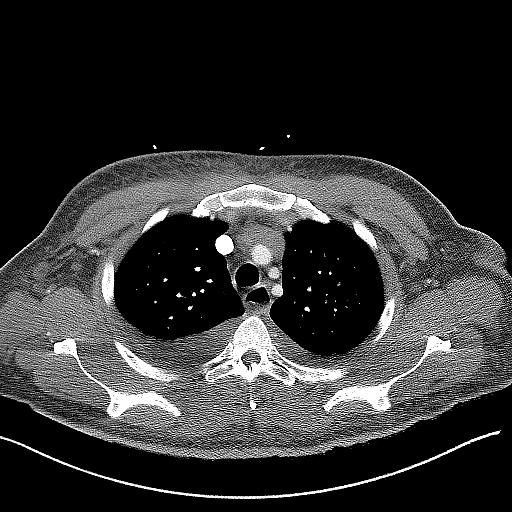

[Series 7: coronal mpr · coronal · 0.61mm/px · 1 of 86 slices shown]
[im 43/86  mediastinal]
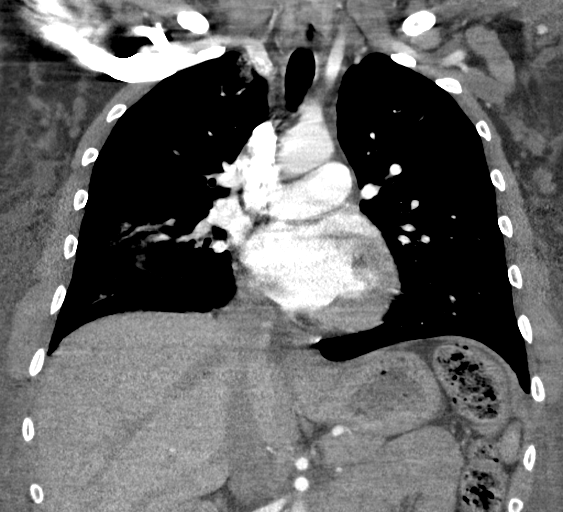

[19 of 36 positions shown; findings below may reference images not displayed]

FINDINGS: Cardiovascular: Heart size normal. No pericardial effusion. No
thoracic aortic aneurysm. No filling defect within the opacified
pulmonary arteries to suggest the presence of an acute pulmonary
embolus.

Mediastinum/Nodes: No mediastinal lymphadenopathy. There is no hilar
lymphadenopathy. Esophagus is patulous and filled with fluid/debris,
possibly from reflux or dysmotility. Upper normal lymph nodes are
seen in the axillary regions bilaterally.

Lungs/Pleura: Patchy airspace disease is seen in the posterior right
middle lobe, diffusely in the right lower lobe, and posteriorly in
the left lower lobe. Small bilateral pleural effusions are evident.

Upper Abdomen: Unremarkable.

Musculoskeletal: Diffuse body wall edema is apparent. Bone windows
reveal no worrisome lytic or sclerotic osseous lesions.

Review of the MIP images confirms the above findings.
IMPRESSION: 1. No CT evidence for acute pulmonary embolus.
2. Diffuse airspace disease in the right lower lobe is associated
with patchy airspace opacity in the posterior right middle lobe and
posterior left lower lobe. Imaging features compatible with
multifocal pneumonia.
3. Small bilateral pleural effusions.
4. Diffuse body wall edema.
